# Patient Record
Sex: Male | Born: 1960 | Race: White | Hispanic: No | Marital: Single | Smoking: Former smoker
Health system: Southern US, Community
[De-identification: ages and names within clinical notes are randomized; demographics above are authoritative.]

## PROBLEM LIST (undated history)

## (undated) DIAGNOSIS — I10 Essential (primary) hypertension: Secondary | ICD-10-CM

## (undated) DIAGNOSIS — G2581 Restless legs syndrome: Secondary | ICD-10-CM

## (undated) DIAGNOSIS — Z915 Personal history of self-harm: Secondary | ICD-10-CM

## (undated) DIAGNOSIS — N429 Disorder of prostate, unspecified: Secondary | ICD-10-CM

## (undated) DIAGNOSIS — F32A Depression, unspecified: Secondary | ICD-10-CM

## (undated) DIAGNOSIS — Z5900 Homelessness unspecified: Secondary | ICD-10-CM

## (undated) DIAGNOSIS — F419 Anxiety disorder, unspecified: Secondary | ICD-10-CM

## (undated) DIAGNOSIS — F329 Major depressive disorder, single episode, unspecified: Secondary | ICD-10-CM

## (undated) DIAGNOSIS — F845 Asperger's syndrome: Secondary | ICD-10-CM

## (undated) DIAGNOSIS — Z9151 Personal history of suicidal behavior: Secondary | ICD-10-CM

---

## 2013-12-18 ENCOUNTER — Emergency Department (HOSPITAL_COMMUNITY): Payer: Self-pay

## 2013-12-18 ENCOUNTER — Inpatient Hospital Stay (HOSPITAL_COMMUNITY)
Admission: EM | Admit: 2013-12-18 | Discharge: 2013-12-20 | DRG: 081 | Disposition: A | Discharge status: Home / Self Care | Payer: Self-pay | Attending: Internal Medicine | Admitting: Internal Medicine

## 2013-12-18 ENCOUNTER — Encounter (HOSPITAL_COMMUNITY): Payer: Self-pay | Admitting: Emergency Medicine

## 2013-12-18 DIAGNOSIS — F3289 Other specified depressive episodes: Secondary | ICD-10-CM | POA: Diagnosis present

## 2013-12-18 DIAGNOSIS — I10 Essential (primary) hypertension: Secondary | ICD-10-CM | POA: Diagnosis present

## 2013-12-18 DIAGNOSIS — Z8673 Personal history of transient ischemic attack (TIA), and cerebral infarction without residual deficits: Secondary | ICD-10-CM

## 2013-12-18 DIAGNOSIS — E860 Dehydration: Secondary | ICD-10-CM | POA: Diagnosis present

## 2013-12-18 DIAGNOSIS — G9340 Encephalopathy, unspecified: Secondary | ICD-10-CM | POA: Insufficient documentation

## 2013-12-18 DIAGNOSIS — F1210 Cannabis abuse, uncomplicated: Secondary | ICD-10-CM | POA: Diagnosis present

## 2013-12-18 DIAGNOSIS — R404 Transient alteration of awareness: Principal | ICD-10-CM | POA: Diagnosis present

## 2013-12-18 DIAGNOSIS — R9089 Other abnormal findings on diagnostic imaging of central nervous system: Secondary | ICD-10-CM | POA: Diagnosis present

## 2013-12-18 DIAGNOSIS — N400 Enlarged prostate without lower urinary tract symptoms: Secondary | ICD-10-CM | POA: Diagnosis present

## 2013-12-18 DIAGNOSIS — F848 Other pervasive developmental disorders: Secondary | ICD-10-CM | POA: Diagnosis present

## 2013-12-18 DIAGNOSIS — Z5900 Homelessness unspecified: Secondary | ICD-10-CM

## 2013-12-18 DIAGNOSIS — F411 Generalized anxiety disorder: Secondary | ICD-10-CM | POA: Diagnosis present

## 2013-12-18 DIAGNOSIS — F329 Major depressive disorder, single episode, unspecified: Secondary | ICD-10-CM | POA: Diagnosis present

## 2013-12-18 DIAGNOSIS — R930 Abnormal findings on diagnostic imaging of skull and head, not elsewhere classified: Secondary | ICD-10-CM

## 2013-12-18 DIAGNOSIS — R410 Disorientation, unspecified: Secondary | ICD-10-CM | POA: Diagnosis present

## 2013-12-18 HISTORY — DX: Essential (primary) hypertension: I10

## 2013-12-18 LAB — CBC WITH DIFFERENTIAL/PLATELET
Eosinophils Relative: 0 % (ref 0–5)
HCT: 47.5 % (ref 39.0–52.0)
Hemoglobin: 16.3 g/dL (ref 13.0–17.0)
Lymphocytes Relative: 15 % (ref 12–46)
Lymphs Abs: 2.3 10*3/uL (ref 0.7–4.0)
MCHC: 34.3 g/dL (ref 30.0–36.0)
MCV: 91.7 fL (ref 78.0–100.0)
Monocytes Absolute: 0.9 10*3/uL (ref 0.1–1.0)
Monocytes Relative: 6 % (ref 3–12)
Neutro Abs: 12.3 10*3/uL — ABNORMAL HIGH (ref 1.7–7.7)
Neutrophils Relative %: 79 % — ABNORMAL HIGH (ref 43–77)
RBC: 5.18 MIL/uL (ref 4.22–5.81)
WBC: 15.5 10*3/uL — ABNORMAL HIGH (ref 4.0–10.5)

## 2013-12-18 LAB — URINALYSIS, ROUTINE W REFLEX MICROSCOPIC
Bilirubin Urine: NEGATIVE
Glucose, UA: NEGATIVE mg/dL
Ketones, ur: 40 mg/dL — AB
Leukocytes, UA: NEGATIVE
Nitrite: NEGATIVE
Specific Gravity, Urine: 1.02 (ref 1.005–1.030)
pH: 6.5 (ref 5.0–8.0)

## 2013-12-18 LAB — COMPREHENSIVE METABOLIC PANEL
Albumin: 4.8 g/dL (ref 3.5–5.2)
Alkaline Phosphatase: 113 U/L (ref 39–117)
BUN: 14 mg/dL (ref 6–23)
Calcium: 9.4 mg/dL (ref 8.4–10.5)
Creatinine, Ser: 0.87 mg/dL (ref 0.50–1.35)
GFR calc Af Amer: >90 mL/min (ref >90)
Glucose, Bld: 152 mg/dL — ABNORMAL HIGH (ref 70–99)
Potassium: 4.2 mEq/L (ref 3.7–5.3)
Total Protein: 8.3 g/dL (ref 6.0–8.3)

## 2013-12-18 LAB — CK TOTAL AND CKMB (NOT AT ARMC): Relative Index: 1.6 (ref 0.0–2.5)

## 2013-12-18 LAB — RAPID URINE DRUG SCREEN, HOSP PERFORMED
Amphetamines: NOT DETECTED
Barbiturates: NOT DETECTED
Benzodiazepines: NOT DETECTED
Cocaine: NOT DETECTED
Opiates: NOT DETECTED

## 2013-12-18 LAB — ETHANOL: Alcohol, Ethyl (B): <11 mg/dL (ref 0–11)

## 2013-12-18 LAB — MRSA PCR SCREENING: MRSA by PCR: NEGATIVE

## 2013-12-18 MED ORDER — SODIUM CHLORIDE 0.9 % IV SOLN
INTRAVENOUS | Status: DC
  Administered 2013-12-18: 16:00:00 via INTRAVENOUS

## 2013-12-18 MED ORDER — LORAZEPAM 1 MG PO TABS: 1.0000 mg | ORAL_TABLET | ORAL | Status: DC | PRN

## 2013-12-18 MED ORDER — PROPRANOLOL HCL 20 MG PO TABS
20.0000 mg | ORAL_TABLET | Freq: Two times a day (BID) | ORAL | Status: DC
  Administered 2013-12-18: 20 mg via ORAL
  Filled 2013-12-18 (×3): qty 1

## 2013-12-18 MED ORDER — ACETAMINOPHEN 650 MG RE SUPP: 650.0000 mg | Freq: Four times a day (QID) | RECTAL | Status: DC | PRN

## 2013-12-18 MED ORDER — TAMSULOSIN HCL 0.4 MG PO CAPS
0.4000 mg | ORAL_CAPSULE | Freq: Every day | ORAL | Status: DC
  Administered 2013-12-18 – 2013-12-19 (×2): 0.4 mg via ORAL
  Filled 2013-12-18 (×4): qty 1

## 2013-12-18 MED ORDER — SODIUM CHLORIDE 0.9 % IV SOLN: INTRAVENOUS | Status: DC

## 2013-12-18 MED ORDER — ASPIRIN EC 81 MG PO TBEC
81.0000 mg | DELAYED_RELEASE_TABLET | Freq: Every day | ORAL | Status: DC
  Administered 2013-12-18 – 2013-12-20 (×3): 81 mg via ORAL
  Filled 2013-12-18 (×3): qty 1

## 2013-12-18 MED ORDER — NORTRIPTYLINE HCL 25 MG PO CAPS
50.0000 mg | ORAL_CAPSULE | Freq: Every day | ORAL | Status: DC
  Administered 2013-12-18: 50 mg via ORAL
  Filled 2013-12-18 (×2): qty 2

## 2013-12-18 MED ORDER — ACETAMINOPHEN 325 MG PO TABS: 650.0000 mg | ORAL_TABLET | Freq: Four times a day (QID) | ORAL | Status: DC | PRN

## 2013-12-18 MED ORDER — SODIUM CHLORIDE 0.9 % IV BOLUS (SEPSIS)
1000.0000 mL | Freq: Once | INTRAVENOUS | Status: AC
  Administered 2013-12-18: 1000 mL via INTRAVENOUS

## 2013-12-18 MED ORDER — ENOXAPARIN SODIUM 40 MG/0.4ML ~~LOC~~ SOLN
40.0000 mg | SUBCUTANEOUS | Status: DC
  Administered 2013-12-18 – 2013-12-19 (×2): 40 mg via SUBCUTANEOUS
  Filled 2013-12-18 (×3): qty 0.4

## 2013-12-18 MED ORDER — LORAZEPAM 2 MG/ML IJ SOLN
2.0000 mg | Freq: Once | INTRAMUSCULAR | Status: AC
  Administered 2013-12-18: 2 mg via INTRAVENOUS
  Filled 2013-12-18: qty 1

## 2013-12-18 MED ORDER — LISINOPRIL 20 MG PO TABS
20.0000 mg | ORAL_TABLET | Freq: Every day | ORAL | Status: DC
  Administered 2013-12-18: 20 mg via ORAL
  Filled 2013-12-18 (×2): qty 1

## 2013-12-18 MED ORDER — CLONIDINE HCL 0.2 MG PO TABS
0.2000 mg | ORAL_TABLET | Freq: Four times a day (QID) | ORAL | Status: DC | PRN
  Filled 2013-12-18: qty 1

## 2013-12-18 NOTE — ED Notes (Signed)
Patient transported to CT 

## 2013-12-18 NOTE — ED Notes (Signed)
Per nurse first, a lady came and said she was bringing the pt here because he was crazy and she left him sitting on the bench outside.  Pt is making is speaking in a word salad.  He did say the word Asbergers, fast heart, anxious.  Denies any pain - hr 145.

## 2013-12-18 NOTE — ED Provider Notes (Signed)
CSN: [id number]     Arrival date & time 12/18/13  1237 History   First MD Initiated Contact with Patient 12/18/13 1248     Chief Complaint  Patient presents with  . Tachycardia  . asbergers    patient agitated level V caveat altered mental status (Consider location/radiation/quality/duration/timing/severity/associated sxs/prior Treatment) HPI Patient respiratory daily Past Medical History  Diagnosis Date  . Autism    patient reportedly dropped off by a bystander who did not stay to give further history. Patient speaking in word salad. Denies pain anywhere. History reviewed. No pertinent past surgical history. No family history on file. History  Substance Use Topics  . Smoking status: Never Smoker   . Smokeless tobacco: Not on file  . Alcohol Use: No   unable to obtain social history. Patient does not answer questions appropriately.  Review of Systems  Unable to perform ROS: Mental status change    Allergies  Review of patient's allergies indicates no known allergies.  Home Medications  No current outpatient prescriptions on file. BP 177/124  Pulse 145  Temp(Src) 98.8 F (37.1 C) (Oral)  Resp 24  Ht 5\' 10"  (1.778 m)  Wt 164 lb 11.2 oz (74.707 kg)  BMI 23.63 kg/m2  SpO2 98% Physical Exam  Nursing note and vitals reviewed. Constitutional: He appears well-developed and well-nourished.  HENT:  Head: Normocephalic and atraumatic.  Eyes: Conjunctivae are normal. Pupils are equal, round, and reactive to light.  Neck: Neck supple. No tracheal deviation present. No thyromegaly present.  Cardiovascular: Normal rate.   No murmur heard. Tachycardic  Pulmonary/Chest: Effort normal and breath sounds normal.  Abdominal: Soft. Bowel sounds are normal. He exhibits no distension. There is no tenderness.  Musculoskeletal: Normal range of motion. He exhibits no edema and no tenderness.  Neurological: He is alert. Coordination normal.  Pain is 2 through 12 grossly intact moves all  extremities well motor strength 5 over 5 overall.  Skin: Skin is warm and dry. No rash noted.  Psychiatric:  Anxious    ED Course  Procedures (including critical care time) Labs Review Labs Reviewed  COMPREHENSIVE METABOLIC PANEL  CBC WITH DIFFERENTIAL  ETHANOL  URINE RAPID DRUG SCREEN (HOSP PERFORMED)  TSH  CK TOTAL AND CKMB   Imaging Review No results found.  EKG Interpretation    Date/Time:  Wednesday December 18 2013 12:40:14 EST Ventricular Rate:  142 PR Interval:  150 QRS Duration: 92 QT Interval:  268 QTC Calculation: 412 R Axis:   48 Text Interpretation:  Sinus tachycardia Otherwise normal ECG No old tracing to compare Confirmed by Ethelda Chick  MD, Thi Klich ([id number]) on 12/18/2013 1:03:17 PM           Results for orders placed during the hospital encounter of 12/18/13  COMPREHENSIVE METABOLIC PANEL      Result Value Range   Sodium 143  137 - 147 mEq/L   Potassium 4.2  3.7 - 5.3 mEq/L   Chloride 102  96 - 112 mEq/L   CO2 23  19 - 32 mEq/L   Glucose, Bld 152 (*) 70 - 99 mg/dL   BUN 14  6 - 23 mg/dL   Creatinine, Ser 4.09  0.50 - 1.35 mg/dL   Calcium 9.4  8.4 - 81.1 mg/dL   Total Protein 8.3  6.0 - 8.3 g/dL   Albumin 4.8  3.5 - 5.2 g/dL   AST 21  0 - 37 U/L   ALT 18  0 - 53 U/L   Alkaline Phosphatase 113  39 - 117 U/L   Total Bilirubin 0.4  0.3 - 1.2 mg/dL   GFR calc non Af Amer >90  >90 mL/min   GFR calc Af Amer >90  >90 mL/min  CBC WITH DIFFERENTIAL      Result Value Range   WBC 15.5 (*) 4.0 - 10.5 K/uL   RBC 5.18  4.22 - 5.81 MIL/uL   Hemoglobin 16.3  13.0 - 17.0 g/dL   HCT 47.8  29.5 - 62.1 %   MCV 91.7  78.0 - 100.0 fL   MCH 31.5  26.0 - 34.0 pg   MCHC 34.3  30.0 - 36.0 g/dL   RDW 30.8  65.7 - 84.6 %   Platelets 295  150 - 400 K/uL   Neutrophils Relative % 79 (*) 43 - 77 %   Neutro Abs 12.3 (*) 1.7 - 7.7 K/uL   Lymphocytes Relative 15  12 - 46 %   Lymphs Abs 2.3  0.7 - 4.0 K/uL   Monocytes Relative 6  3 - 12 %   Monocytes Absolute 0.9  0.1 -  1.0 K/uL   Eosinophils Relative 0  0 - 5 %   Eosinophils Absolute 0.0  0.0 - 0.7 K/uL   Basophils Relative 0  0 - 1 %   Basophils Absolute 0.0  0.0 - 0.1 K/uL  ETHANOL      Result Value Range   Alcohol, Ethyl (B) <11  0 - 11 mg/dL  CK TOTAL AND CKMB      Result Value Range   Total CK 240 (*) 7 - 232 U/L   CK, MB 3.9  0.3 - 4.0 ng/mL   Relative Index 1.6  0.0 - 2.5   Ct Head Wo Contrast  12/18/2013   CLINICAL DATA:  Altered mental status.  EXAM: CT HEAD WITHOUT CONTRAST  TECHNIQUE: Contiguous axial images were obtained from the base of the skull through the vertex without intravenous contrast.  COMPARISON:  None.  FINDINGS: 10 mm hypodensity extending from the head of the left caudate nucleus into the anterior limb of the left internal capsule and globus pallidus nucleus, potentially chronic or subacute. Otherwise, the brainstem, cerebellum, cerebral peduncles, thalamus, basal ganglia, basilar cisterns, and ventricular system appear within normal limits. No intracranial mass lesion or intracranial hemorrhage.  IMPRESSION: 1. 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 Otherwise negative exam.   Electronically Signed   By: Herbie Baltimore M.D.   On: 12/18/2013 14:20    3:10 PM patient somnolent arousable to gentle tactile stimulus after treatment with intravenous Ativan. Continues to speak in word salad. MDM  No diagnosis found. There is no one available to give further history. Old records unavailable. Patient is not have a home telephone number or address listed. Chronicity of illness is unknown. Patient is felt to be hypermetabolic or hyperadrenergic Plan admit step down bed Case discussed with Dr. Lendell Caprice Admit step down bed Diagnosis #1acute encephalopathy #2hyperglycemia  CRITICAL CARE Performed by: Doug Sou Total critical care time: 40 minute Critical care time was  exclusive of separately billable procedures and treating other patients. Critical care was necessary to treat or prevent imminent or life-threatening deterioration. Critical care was time spent personally by me on the following activities: development of treatment plan with patient and/or surrogate as well as nursing, discussions with consultants, evaluation of patient's response to treatment, examination of patient, obtaining history from patient or  surrogate, ordering and performing treatments and interventions, ordering and review of laboratory studies, ordering and review of radiographic studies, pulse oximetry and re-evaluation of patient's condition.  Doug Sou, MD 12/18/13 [phone]

## 2013-12-18 NOTE — H&P (Signed)
[facility] History and Physical  Zachary Russo WUJ:[id number] DOB: 01/25/1961 DOA: 12/18/2013  Referring physician: Rennis Russo PCP: Zachary Russo public health   Chief Complaint: dropped of in ED by unknown woman: "he's crazy"  HPI: Zachary Russo is a 52 y.o. male  Brought to ED by a woman who said "he's crazy" and left.  Initially was "speaking word salad".  Patient was tachycardic into the 150s, blood pressure.  180/120.  Heart rate and blood pressure decreased after receiving ativan, but now high again.  CT brain shows old or subacute infarct.  Labs ok, including blood alcohol. UDS ordered, but not yet collected.  Patient received 2 mg IV ativan in ED.  Now able to give some details of medical history, but still somewhat disoriented and unable to say why he was brought to the ED.  Denies alcohol.  Smokes marijuana occasionally.  Reports he has a psychiatric history and was admitted to hospital in [place] "to get meds adusted".  Asking if he needs to be commited to psych hospital.  Admits to h/o Asberger's syndrome, HTN, anxiety.  Lives in a homeless shelter and takes antihypertensives "as needed". Weaned off klonipin a few months ago.   Review of Systems:  Systems reviewed. As above, otherwise negative  PMH: per report: Asberger's Syndrome, HTN, "Psychiatric history", prostatism  Sugical hx: none  Social History: homeless. Recently moved back to [place]. Lives in homeless shelter. Denies EtOH. Smokes Marijuana  No Known Allergies  FH: adopted  Meds: Per report: propranolol 10 mg "as needed", lisinopril 20 mg, gabapentin 300 mg hs "sometimes", nortriptyline, previously, klonipin, prostate medication  Physical Exam: Filed Vitals:   12/18/13 2000  BP: 185/114  Pulse: 125  Temp: 99.4 F (37.4 C)  Resp: 21    BP 185/114  Pulse 125  Temp(Src) 99.4 F (37.4 C) (Oral)  Resp 21  Ht 5\' 10"  (1.778 m)  Wt 74.3 kg (163 lb 12.8 oz)  BMI 23.50 kg/m2  SpO2 99%  BP 185/114   Pulse 125  Temp(Src) 99.4 F (37.4 C) (Oral)  Resp 21  Ht 5\' 10"  (1.778 m)  Wt 74.3 kg (163 lb 12.8 oz)  BMI 23.50 kg/m2  SpO2 99%  General Appearance:    Alert, cooperative, no distress, sometimes meandering and inappropriate, but answers some questions appropriately. Disoriented to date. Knows year and month.  Head:    Normocephalic, without obvious abnormality, atraumatic  Eyes:    PERRL, conjunctiva/corneas clear, EOM's intact, fundi    benign, both eyes          Nose:   Nares normal, septum midline, mucosa normal, no drainage   or sinus tenderness  Throat:   Slightly dry mucous membranes  Neck:   Supple, symmetrical, trachea midline, no adenopathy;       thyroid:  No enlargement/tenderness/nodules; no carotid   bruit or JVD  Back:     Symmetric, no curvature, ROM normal, no CVA tenderness  Lungs:     Clear to auscultation bilaterally, respirations unlabored  Chest wall:    No tenderness or deformity  Heart:    Fast, regular, no MGR  Abdomen:     Soft, non-tender, bowel sounds active all four quadrants,    no masses, no organomegaly  Genitalia:    deferred  Rectal:    deferred  Extremities:   Extremities normal, atraumatic, no cyanosis or edema  Pulses:   2+ and symmetric all extremities  Skin:   Skin color, texture, turgor normal, no rashes or lesions  Lymph nodes:   Cervical, supraclavicular, and axillary nodes normal  Neurologic:   CNII-XII intact. Normal strength, sensation and reflexes      throughout             Psych: cooperative. Affect appropriate.  Labs on Admission:  Basic Metabolic Panel:  Recent Labs Lab 12/18/13 1315  NA 143  K 4.2  CL 102  CO2 23  GLUCOSE 152*  BUN 14  CREATININE 0.87  CALCIUM 9.4   Liver Function Tests:  Recent Labs Lab 12/18/13 1315  AST 21  ALT 18  ALKPHOS 113  BILITOT 0.4  PROT 8.3  ALBUMIN 4.8   No results found for this basename: LIPASE, AMYLASE,  in the last 168 hours No results found for this basename:  AMMONIA,  in the last 168 hours CBC:  Recent Labs Lab 12/18/13 1315  WBC 15.5*  NEUTROABS 12.3*  HGB 16.3  HCT 47.5  MCV 91.7  PLT 295   Cardiac Enzymes:  Recent Labs Lab 12/18/13 1315  CKTOTAL 240*  CKMB 3.9    BNP (last 3 results) No results found for this basename: PROBNP,  in the last 8760 hours CBG: No results found for this basename: GLUCAP,  in the last 168 hours  Radiological Exams on Admission: Ct Head Wo Contrast  12/18/2013   CLINICAL DATA:  Altered mental status.  EXAM: CT HEAD WITHOUT CONTRAST  TECHNIQUE: Contiguous axial images were obtained from the base of the skull through the vertex without intravenous contrast.  COMPARISON:  None.  FINDINGS: 10 mm hypodensity extending from the head of the left caudate nucleus into the anterior limb of the left internal capsule and globus pallidus nucleus, potentially chronic or subacute. Otherwise, the brainstem, cerebellum, cerebral peduncles, thalamus, basal ganglia, basilar cisterns, and ventricular system appear within normal limits. No intracranial mass lesion or intracranial hemorrhage.  IMPRESSION: 1. 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 Otherwise negative exam.   Electronically Signed   By: Herbie Baltimore M.D.   On: 12/18/2013 14:20    EKG: Sinus tachycardia Otherwise normal ECG No old tracing to compare  Assessment/Plan    Delirium (v. Psychosis): improved. UDS pending.  CT brain shows old or subacute CVA.  Will check MRI.  Still somewhat inapropriate.  Baseline unknown.  Incomplete database.  Will request records from hospital in [place].  May need psychiatric consult. Ativan helped. Will order PRN. Could be withdrawal from klonipin, but pt denies taking any for months  Mild dehydration: IVF    Malignant hypertension: resume lisinopril and propranolol.     Abnormal brain CT  Reported h/o  Asbergers and unknown psychiatric illness, BPH  Start flomax  Code Status: full Family Communication: no family? Disposition Plan: ?  Time spent: 60 min  Zachary Russo [facility] Pager [phone]

## 2013-12-18 NOTE — ED Notes (Signed)
Patient still unable to get an urine sample will try again later

## 2013-12-19 ENCOUNTER — Inpatient Hospital Stay (HOSPITAL_COMMUNITY): Payer: Self-pay

## 2013-12-19 DIAGNOSIS — G9340 Encephalopathy, unspecified: Secondary | ICD-10-CM

## 2013-12-19 DIAGNOSIS — R4182 Altered mental status, unspecified: Secondary | ICD-10-CM

## 2013-12-19 DIAGNOSIS — F848 Other pervasive developmental disorders: Secondary | ICD-10-CM

## 2013-12-19 DIAGNOSIS — F1910 Other psychoactive substance abuse, uncomplicated: Secondary | ICD-10-CM

## 2013-12-19 LAB — CBC WITH DIFFERENTIAL/PLATELET
Basophils Absolute: 0 10*3/uL (ref 0.0–0.1)
Basophils Relative: 0 % (ref 0–1)
Eosinophils Absolute: 0.1 10*3/uL (ref 0.0–0.7)
Eosinophils Relative: 1 % (ref 0–5)
HCT: 40 % (ref 39.0–52.0)
Hemoglobin: 13.3 g/dL (ref 13.0–17.0)
Lymphocytes Relative: 20 % (ref 12–46)
Lymphs Abs: 2.1 10*3/uL (ref 0.7–4.0)
MCH: 31 pg (ref 26.0–34.0)
MCHC: 33.3 g/dL (ref 30.0–36.0)
MCV: 93.2 fL (ref 78.0–100.0)
Monocytes Absolute: 0.5 10*3/uL (ref 0.1–1.0)
Monocytes Relative: 5 % (ref 3–12)
Neutro Abs: 7.8 10*3/uL — ABNORMAL HIGH (ref 1.7–7.7)
Neutrophils Relative %: 74 % (ref 43–77)
Platelets: 232 10*3/uL (ref 150–400)
RBC: 4.29 MIL/uL (ref 4.22–5.81)
RDW: 13.9 % (ref 11.5–15.5)
WBC: 10.5 10*3/uL (ref 4.0–10.5)

## 2013-12-19 LAB — TSH: TSH: 1.425 u[IU]/mL (ref 0.350–4.500)

## 2013-12-19 MED ORDER — GABAPENTIN 100 MG PO CAPS
100.0000 mg | ORAL_CAPSULE | Freq: Two times a day (BID) | ORAL | Status: DC
  Filled 2013-12-19 (×4): qty 1

## 2013-12-19 MED ORDER — PROPRANOLOL HCL 20 MG PO TABS
20.0000 mg | ORAL_TABLET | Freq: Two times a day (BID) | ORAL | Status: DC
  Administered 2013-12-19 (×2): 20 mg via ORAL
  Filled 2013-12-19 (×4): qty 1

## 2013-12-19 MED ORDER — LORAZEPAM 2 MG/ML IJ SOLN
1.0000 mg | Freq: Once | INTRAMUSCULAR | Status: AC
  Administered 2013-12-19: 1 mg via INTRAVENOUS
  Filled 2013-12-19: qty 1

## 2013-12-19 MED ORDER — CLONIDINE HCL 0.2 MG PO TABS
0.2000 mg | ORAL_TABLET | Freq: Two times a day (BID) | ORAL | Status: DC
  Administered 2013-12-19 (×2): 0.2 mg via ORAL
  Filled 2013-12-19 (×4): qty 1

## 2013-12-19 MED ORDER — NORTRIPTYLINE HCL 25 MG PO CAPS
100.0000 mg | ORAL_CAPSULE | Freq: Every day | ORAL | Status: DC
  Administered 2013-12-19: 22:00:00 100 mg via ORAL
  Filled 2013-12-19 (×2): qty 4

## 2013-12-19 MED ORDER — LISINOPRIL 20 MG PO TABS
20.0000 mg | ORAL_TABLET | Freq: Every day | ORAL | Status: DC
  Administered 2013-12-19 – 2013-12-20 (×2): 20 mg via ORAL
  Filled 2013-12-19 (×2): qty 1

## 2013-12-19 NOTE — Progress Notes (Signed)
Zachary Russo [id number] Admission Data: 12/19/2013 6:21 PM Attending Provider: Christiane Haorinna L Sullivan, MD  PCP:No PCP Per Patient Consults/ Treatment Team:    Zachary Russo is a 53 y.o. male patient admitted from ED awake, alert  & orientated  X 3,  Full Code, VSS - Blood pressure 127/90, pulse 98, temperature 97.9 F (36.6 C), temperature source Oral, resp. rate 18, height 5\' 10"  (1.778 m), weight 76.3 kg (168 lb 3.4 oz), SpO2 100.00%.,no c/o shortness of breath, no c/o chest pain, no distress noted.    Allergies:  No Known Allergies   Past Medical History  Diagnosis Date  . Hypertension   .Pt verbalizes an understanding of how to use the call bell and to call for help before getting out of bed.  Skin, clean-dry- intact without evidence of bruising, or skin tears.   No evidence of skin break down noted on exam.    Will cont to monitor and assist as needed.  Kern ReapBrumagin, Isaah Furry L, RN 12/19/2013 6:21 PM

## 2013-12-19 NOTE — Progress Notes (Addendum)
[facility] PROGRESS NOTE  Zachary ButterKevin Biswas UEA:[id number] DOB: 1961/03/14 DOA: 12/18/2013 PCP: No PCP Per Patient  Assessment/Plan:  Active Problems:   Delirium vs psychosis: improved. Suspect close to baseline. No records from OSH yet. Per friend, Hortencia ConradiMara Barker [phone], has had episodes of this in the past. She reports he has a history of mental illness and has been hospitalized in a psych facility for similar presentation. He arrived at her house from a homeless shelter yesterday "speaking gibberish". She reports he has a history of depression. Unable to give any more history.  Urine drug screen positive for THC, to which patient had already admitted. Seems a bit anxious currently. Will give another dose of Ativan. Saline Lock IV. Consult psychiatry for medication management.    Malignant hypertension: Medications resumed. Blood pressure tends to rise with level of agitation. See above. Will also give standing dose of clonidine as this will help blood pressure and anxiety.    Abnormal brain CT: MRI brain pending  Reported history of Aspergers syndrome, BPH  After I made rounds, the nurse reports that patient is upset about not diet and is threatening to leave AGAINST MEDICAL ADVICE. He is currently lucid and may be close to his clinical baseline. He has capacity to leave AMA if he so chooses, but I encouraged him to stay. Transfer to MedSurg if he does stay.  Code Status:  full Family Communication:   Disposition Plan:  home  HPI/Subjective: Feels anxious about not having all the information. Reports he has "OCD" and is anxious about his blood pressure. Wondering when he'll get his blood pressure. Asking if he can come off salt restricted diet.  Objective: Filed Vitals:   12/19/13 0856  BP: 128/110  Pulse: 109  Temp: 97.3 F (36.3 C)  Resp:     Intake/Output Summary (Last 24 hours) at 12/19/13 0909 Last data filed at 12/19/13 0800  Gross per 24 hour  Intake   1625 ml   Output   1350 ml  Net    275 ml   Filed Weights   12/18/13 1244 12/18/13 1747  Weight: 74.707 kg (164 lb 11.2 oz) 74.3 kg (163 lb 12.8 oz)    Exam:   General:  Anxious appearing. Cooperative. Oriented to time place person. Remembers me from yesterday  Cardiovascular: Tachycardic, regular  Respiratory: Clear to auscultation bilaterally without wheeze rhonchi or rales  Abdomen: Soft nontender nondistended  Ext: No clubbing cyanosis or edema  Psychiatric: Appears anxious but cooperative. For the most part appropriate, but does sometimes meander when asked direct yes or no questions.  Neurologic: Cranial nerves sensory motor exam are intact. No tremulousness.  Basic Metabolic Panel:  Recent Labs Lab 12/18/13 1315  NA 143  K 4.2  CL 102  CO2 23  GLUCOSE 152*  BUN 14  CREATININE 0.87  CALCIUM 9.4   Liver Function Tests:  Recent Labs Lab 12/18/13 1315  AST 21  ALT 18  ALKPHOS 113  BILITOT 0.4  PROT 8.3  ALBUMIN 4.8   No results found for this basename: LIPASE, AMYLASE,  in the last 168 hours No results found for this basename: AMMONIA,  in the last 168 hours CBC:  Recent Labs Lab 12/18/13 1315 12/19/13 0400  WBC 15.5* 10.5  NEUTROABS 12.3* 7.8*  HGB 16.3 13.3  HCT 47.5 40.0  MCV 91.7 93.2  PLT 295 232   Cardiac Enzymes:  Recent Labs Lab 12/18/13 1315  CKTOTAL 240*  CKMB 3.9   BNP (last 3  results) No results found for this basename: PROBNP,  in the last 8760 hours CBG: No results found for this basename: GLUCAP,  in the last 168 hours  Recent Results (from the past 240 hour(s))  MRSA PCR SCREENING     Status: None   Collection Time    12/18/13  7:04 PM      Result Value Range Status   MRSA by PCR NEGATIVE  NEGATIVE Final   Comment:            The GeneXpert MRSA Assay (FDA     approved for NASAL specimens     only), is one component of a     comprehensive MRSA colonization     surveillance program. It is not     intended to diagnose  MRSA     infection nor to guide or     monitor treatment for     MRSA infections.     Studies: Ct Head Wo Contrast  12/18/2013   CLINICAL DATA:  Altered mental status.  EXAM: CT HEAD WITHOUT CONTRAST  TECHNIQUE: Contiguous axial images were obtained from the base of the skull through the vertex without intravenous contrast.  COMPARISON:  None.  FINDINGS: 10 mm hypodensity extending from the head of the left caudate nucleus into the anterior limb of the left internal capsule and globus pallidus nucleus, potentially chronic or subacute. Otherwise, the brainstem, cerebellum, cerebral peduncles, thalamus, basal ganglia, basilar cisterns, and ventricular system appear within normal limits. No intracranial mass lesion or intracranial hemorrhage.  IMPRESSION: 1. 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 Otherwise negative exam.   Electronically Signed   By: Herbie Baltimore M.D.   On: 12/18/2013 14:20    Scheduled Meds: . aspirin EC  81 mg Oral Daily  . enoxaparin (LOVENOX) injection  40 mg Subcutaneous Q24H  . lisinopril  20 mg Oral Daily  . LORazepam  1 mg Intravenous Once  . nortriptyline  50 mg Oral QHS  . propranolol  20 mg Oral BID  . tamsulosin  0.4 mg Oral QPC supper   Continuous Infusions:   Time spent: 35 minutes  Tiphanie Vo L  [facility] Pager [phone]. If 7PM-7AM, please contact night-coverage at [email or web address], password [facility] 12/19/2013, 9:09 AM  LOS: 1 day

## 2013-12-19 NOTE — Progress Notes (Addendum)
On call csw received call from RN regarding outpatient psych resources for patient. Per RN, patient recommended by psychiatrist [facility]. CSW emailed rn resources, including information for [facility] and mobile crisis. RN aware that [facility] does not take appointments and that [facility].   Catha GosselinKristen Stewart, LCSW on call csw pager # [phone] .12/19/2013 1520pm   Per attending, patient is not medically stable at this time. Per attending patient may be ready for discharge tomorrow. Unit csw to follow up at that time with homeless resources tomorrow, including information for [facility].   Catha GosselinKristen Stewart, LCSW on call csw pager # [phone] .12/19/2013 1533pm

## 2013-12-19 NOTE — Progress Notes (Signed)
D/c telemetry per MD at this time

## 2013-12-19 NOTE — Consult Note (Signed)
[facility] Face-to-Face Psychiatry Consult   Reason for Consult:  Change in her mental status Referring Physician:  Dr Drema Balzarine is an 53 y.o. male.  Assessment: AXIS I:  Substance Abuse and delerium AXIS II:  Deferred AXIS III:   Past Medical History  Diagnosis Date  . Hypertension    AXIS IV:  other psychosocial or environmental problems and problems related to social environment AXIS V:  51-60 moderate symptoms  Plan:  No evidence of imminent risk to self or others at present.   Patient does not meet criteria for psychiatric inpatient admission. Supportive therapy provided about ongoing stressors. Discussed crisis plan, support from social network, calling 911, coming to the Emergency Department, and calling Suicide Hotline.  Subjective:   Zachary Russo is a 53 y.o. male patient admitted with change in her mental status.  HPI:  Patient seen chart reviewed.  The patient is a 53 year old male Caucasian currently unemployed man who was admitted on the medical floor because of change in her mental status.  As per chart he was incoherent and confused.  Patient has Asperger syndrome, hypertension and depressive disorder.  His mental status is much improved from the past.  Patient admitted history of anxiety and depression and has taken Klonopin, Cogentin and nortriptyline in the past.  Patient endorsed that he has been taking himself Klonopin off because he has difficulty getting her benzodiazepine.  Patient follows at [facility] in [place].  He's been living in a shelter for past 2 weeks since his houses for closure.  Patient admitted that living situation is very stressful.  He does not like other people's hygiene.  Patient admitted history of one psychiatric hospitalization in [place].  He has history of overdose on Ambien and believe Ambien he came more confused and he was hallucinating.  Patient endorses family members are dead.  He has 4 children.  He has a very close friend who  provided some information.  Patient is positive marijuana.  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  Patient does not exhibit any paranoia, psychosis at this time.  He denies any suicidal thoughts or any homicidal thoughts.  He is not happy because he's not getting regular food.  However he is cooperative and he is not aggressive or combative.  He requested to be discharged like to followup outpatient. HPI Elements:   Location:  Medical floor. Quality:  fair. Severity:  mild.  Past Psychiatric History: Past Medical History  Diagnosis Date  . Hypertension     reports that he has never smoked. He does not have any smokeless tobacco history on file. He reports that he does not drink alcohol or use illicit drugs. No family history on file.       Abuse/Neglect [facility]) Physical Abuse: Denies Verbal Abuse: Denies Sexual Abuse: Denies Allergies:  No Known Allergies  ACT Assessment Complete:  Yes:    Educational Status    Risk to Self: Risk to self Is patient at risk for suicide?: No Substance abuse history and/or treatment for substance abuse?: No  Risk to Others:    Abuse: Abuse/Neglect Assessment (Assessment to be complete while patient is alone) Physical Abuse: Denies Verbal Abuse: Denies Sexual Abuse: Denies Exploitation of patient/patient's resources: Denies Self-Neglect: Denies  Prior Inpatient Therapy:    Prior Outpatient Therapy:    Additional Information:                    Objective: Blood pressure 161/110, pulse  109, temperature 98 F (36.7 C), temperature source Oral, resp. rate 18, height 5' 10"  (1.778 m), weight 163 lb 12.8 oz (74.3 kg), SpO2 99.00%.Body mass index is 23.5 kg/(m^2). Results for orders placed during the hospital encounter of 12/18/13 (from the past 72 hour(s))  COMPREHENSIVE METABOLIC PANEL     Status: Abnormal   Collection Time    12/18/13  1:15 PM      Result Value Range   Sodium 143  137 - 147 mEq/L   Comment: Please note  change in reference range.   Potassium 4.2  3.7 - 5.3 mEq/L   Comment: Please note change in reference range.   Chloride 102  96 - 112 mEq/L   CO2 23  19 - 32 mEq/L   Glucose, Bld 152 (*) 70 - 99 mg/dL   BUN 14  6 - 23 mg/dL   Creatinine, Ser 0.87  0.50 - 1.35 mg/dL   Calcium 9.4  8.4 - 10.5 mg/dL   Total Protein 8.3  6.0 - 8.3 g/dL   Albumin 4.8  3.5 - 5.2 g/dL   AST 21  0 - 37 U/L   Comment: HEMOLYSIS AT THIS LEVEL MAY AFFECT RESULT   ALT 18  0 - 53 U/L   Alkaline Phosphatase 113  39 - 117 U/L   Total Bilirubin 0.4  0.3 - 1.2 mg/dL   GFR calc non Af Amer >90  >90 mL/min   GFR calc Af Amer >90  >90 mL/min   Comment: (NOTE)     The eGFR has been calculated using the CKD EPI equation.     This calculation has not been validated in all clinical situations.     eGFR's persistently <90 mL/min signify possible Chronic Kidney     Disease.  CBC WITH DIFFERENTIAL     Status: Abnormal   Collection Time    12/18/13  1:15 PM      Result Value Range   WBC 15.5 (*) 4.0 - 10.5 K/uL   RBC 5.18  4.22 - 5.81 MIL/uL   Hemoglobin 16.3  13.0 - 17.0 g/dL   HCT 47.5  39.0 - 52.0 %   MCV 91.7  78.0 - 100.0 fL   MCH 31.5  26.0 - 34.0 pg   MCHC 34.3  30.0 - 36.0 g/dL   RDW 13.6  11.5 - 15.5 %   Platelets 295  150 - 400 K/uL   Neutrophils Relative % 79 (*) 43 - 77 %   Neutro Abs 12.3 (*) 1.7 - 7.7 K/uL   Lymphocytes Relative 15  12 - 46 %   Lymphs Abs 2.3  0.7 - 4.0 K/uL   Monocytes Relative 6  3 - 12 %   Monocytes Absolute 0.9  0.1 - 1.0 K/uL   Eosinophils Relative 0  0 - 5 %   Eosinophils Absolute 0.0  0.0 - 0.7 K/uL   Basophils Relative 0  0 - 1 %   Basophils Absolute 0.0  0.0 - 0.1 K/uL  ETHANOL     Status: None   Collection Time    12/18/13  1:15 PM      Result Value Range   Alcohol, Ethyl (B) <11  0 - 11 mg/dL   Comment:            LOWEST DETECTABLE LIMIT FOR     SERUM ALCOHOL IS 11 mg/dL     FOR MEDICAL PURPOSES ONLY  CK TOTAL AND CKMB     Status: Abnormal  Collection Time     12/18/13  1:15 PM      Result Value Range   Total CK 240 (*) 7 - 232 U/L   CK, MB 3.9  0.3 - 4.0 ng/mL   Relative Index 1.6  0.0 - 2.5  MRSA PCR SCREENING     Status: None   Collection Time    12/18/13  7:04 PM      Result Value Range   MRSA by PCR NEGATIVE  NEGATIVE   Comment:            The GeneXpert MRSA Assay (FDA     approved for NASAL specimens     only), is one component of a     comprehensive MRSA colonization     surveillance program. It is not     intended to diagnose MRSA     infection nor to guide or     monitor treatment for     MRSA infections.  TSH     Status: None   Collection Time    12/18/13  8:05 PM      Result Value Range   TSH 1.425  0.350 - 4.500 uIU/mL   Comment: Performed at [facility]     Status: Abnormal   Collection Time    12/18/13  9:50 PM      Result Value Range   Opiates NONE DETECTED  NONE DETECTED   Cocaine NONE DETECTED  NONE DETECTED   Benzodiazepines NONE DETECTED  NONE DETECTED   Amphetamines NONE DETECTED  NONE DETECTED   Tetrahydrocannabinol POSITIVE (*) NONE DETECTED   Barbiturates NONE DETECTED  NONE DETECTED   Comment:            DRUG SCREEN FOR MEDICAL PURPOSES     ONLY.  IF CONFIRMATION IS NEEDED     FOR ANY PURPOSE, NOTIFY LAB     WITHIN 5 DAYS.                LOWEST DETECTABLE LIMITS     FOR URINE DRUG SCREEN     Drug Class       Cutoff (ng/mL)     Amphetamine      1000     Barbiturate      200     Benzodiazepine   294     Tricyclics       765     Opiates          300     Cocaine          300     THC              50  URINALYSIS, ROUTINE W REFLEX MICROSCOPIC     Status: Abnormal   Collection Time    12/18/13  9:50 PM      Result Value Range   Color, Urine YELLOW  YELLOW   APPearance HAZY (*) CLEAR   Specific Gravity, Urine 1.020  1.005 - 1.030   pH 6.5  5.0 - 8.0   Glucose, UA NEGATIVE  NEGATIVE mg/dL   Hgb urine dipstick NEGATIVE  NEGATIVE   Bilirubin Urine  NEGATIVE  NEGATIVE   Ketones, ur 40 (*) NEGATIVE mg/dL   Protein, ur NEGATIVE  NEGATIVE mg/dL   Urobilinogen, UA 1.0  0.0 - 1.0 mg/dL   Nitrite NEGATIVE  NEGATIVE   Leukocytes, UA NEGATIVE  NEGATIVE   Comment: MICROSCOPIC NOT DONE ON URINES WITH  NEGATIVE PROTEIN, BLOOD, LEUKOCYTES, NITRITE, OR GLUCOSE <1000 mg/dL.  CBC WITH DIFFERENTIAL     Status: Abnormal   Collection Time    12/19/13  4:00 AM      Result Value Range   WBC 10.5  4.0 - 10.5 K/uL   RBC 4.29  4.22 - 5.81 MIL/uL   Hemoglobin 13.3  13.0 - 17.0 g/dL   Comment: DELTA CHECK NOTED     REPEATED TO VERIFY   HCT 40.0  39.0 - 52.0 %   MCV 93.2  78.0 - 100.0 fL   MCH 31.0  26.0 - 34.0 pg   MCHC 33.3  30.0 - 36.0 g/dL   RDW 13.9  11.5 - 15.5 %   Platelets 232  150 - 400 K/uL   Neutrophils Relative % 74  43 - 77 %   Neutro Abs 7.8 (*) 1.7 - 7.7 K/uL   Lymphocytes Relative 20  12 - 46 %   Lymphs Abs 2.1  0.7 - 4.0 K/uL   Monocytes Relative 5  3 - 12 %   Monocytes Absolute 0.5  0.1 - 1.0 K/uL   Eosinophils Relative 1  0 - 5 %   Eosinophils Absolute 0.1  0.0 - 0.7 K/uL   Basophils Relative 0  0 - 1 %   Basophils Absolute 0.0  0.0 - 0.1 K/uL   Labs are reviewed.  Current Facility-Administered Medications  Medication Dose Route Frequency Provider Last Rate Last Dose  . acetaminophen (TYLENOL) tablet 650 mg  650 mg Oral Q6H PRN Delfina Redwood, MD       Or  . acetaminophen (TYLENOL) suppository 650 mg  650 mg Rectal Q6H PRN Delfina Redwood, MD      . aspirin EC tablet 81 mg  81 mg Oral Daily Delfina Redwood, MD   81 mg at 12/19/13 0859  . cloNIDine (CATAPRES) tablet 0.2 mg  0.2 mg Oral BID Delfina Redwood, MD   0.2 mg at 12/19/13 1114  . enoxaparin (LOVENOX) injection 40 mg  40 mg Subcutaneous Q24H Delfina Redwood, MD   40 mg at 12/18/13 2044  . lisinopril (PRINIVIL,ZESTRIL) tablet 20 mg  20 mg Oral Daily Delfina Redwood, MD   20 mg at 12/19/13 0859  . LORazepam (ATIVAN) tablet 1 mg  1 mg Oral Q4H PRN  Delfina Redwood, MD      . nortriptyline (PAMELOR) capsule 100 mg  100 mg Oral QHS Delfina Redwood, MD      . propranolol (INDERAL) tablet 20 mg  20 mg Oral BID Delfina Redwood, MD   20 mg at 12/19/13 0858  . tamsulosin (FLOMAX) capsule 0.4 mg  0.4 mg Oral QPC supper Delfina Redwood, MD   0.4 mg at 12/18/13 2043    Psychiatric Specialty Exam:     Blood pressure 161/110, pulse 109, temperature 98 F (36.7 C), temperature source Oral, resp. rate 18, height 5' 10"  (1.778 m), weight 163 lb 12.8 oz (74.3 kg), SpO2 99.00%.Body mass index is 23.5 kg/(m^2).  General Appearance: Casual  Eye Contact::  Fair  Speech:  Normal Rate  Volume:  Normal  Mood:  Anxious and Irritable  Affect:  Congruent and Constricted  Thought Process:  Coherent, Intact and Linear  Orientation:  Full (Time, Place, and Person)  Thought Content:  Rumination  Suicidal Thoughts:  No  Homicidal Thoughts:  No  Memory:  Immediate;   Fair Recent;   Fair Remote;   Fair  Judgement:  Fair  Insight:  Fair  Psychomotor Activity:  Increased  Concentration:  Fair  Recall:  Fair  Akathisia:  No  Handed:  Right  AIMS (if indicated):     Assets:  Communication Skills Desire for Improvement Social Support  Sleep:      Treatment Plan Summary: Patient does not meet criteria for inpatient psychiatric services.  His mental status is improved. he has capacity to participate in his treatment plan.  Start Neurontin 100 mg twice a day to help his anxiety and continue nortriptyline .  Patient can be seen at [facility] for outpatient services.  Social worker can arrange outpatient referrals.  Please contact [phone] further question.  ARFEEN,SYED T. 12/19/2013 2:06 PM

## 2013-12-19 NOTE — Progress Notes (Signed)
Pt threatening to sign out AMA; pt educated; MD made aware; will continue to monitor and educate pt appropriately;

## 2013-12-20 MED ORDER — TAMSULOSIN HCL 0.4 MG PO CAPS: 0.4000 mg | ORAL_CAPSULE | Freq: Every day | ORAL | Status: DC

## 2013-12-20 MED ORDER — PROPRANOLOL HCL 10 MG PO TABS
10.0000 mg | ORAL_TABLET | Freq: Two times a day (BID) | ORAL | Status: DC
  Filled 2013-12-20 (×2): qty 1

## 2013-12-20 MED ORDER — LISINOPRIL 20 MG PO TABS: 20.0000 mg | ORAL_TABLET | Freq: Every day | ORAL | Status: DC

## 2013-12-20 MED ORDER — NORTRIPTYLINE HCL 50 MG PO CAPS: 100.0000 mg | ORAL_CAPSULE | Freq: Every day | ORAL | Status: DC

## 2013-12-20 MED ORDER — ASPIRIN 81 MG PO TBEC: 81.0000 mg | DELAYED_RELEASE_TABLET | Freq: Every day | ORAL | Status: DC

## 2013-12-20 MED ORDER — PROPRANOLOL HCL 10 MG PO TABS: 10.0000 mg | ORAL_TABLET | Freq: Two times a day (BID) | ORAL | Status: DC

## 2013-12-20 NOTE — Progress Notes (Addendum)
   CARE MANAGEMENT NOTE 12/20/2013  Patient:  Christen ButterMOORE,Zaden   Account Number:  [id number]  Date Initiated:  12/20/2013  Documentation initiated by:  [facility]Calisa Luckenbaugh  Subjective/Objective Assessment:   delirum, HTN, possible stroke     Action/Plan:   lives in shelter   Anticipated DC Date:  12/20/2013   Anticipated DC Plan:  HOME/SELF CARE      DC Planning Services  CM consult      Choice offered to / List presented to:             Status of service:  Completed, signed off Medicare Important Message given?   (If response is "NO", the following Medicare IM given date fields will be blank) Date Medicare IM given:   Date Additional Medicare IM given:    Discharge Disposition:  HOME/SELF CARE  Per UR Regulation:    If discussed at Long Length of Stay Meetings, dates discussed:    Comments:  12/20/2013 4:06 PM  NCM spoke to pt and provided him with [facility]. Pt to call and arrange appt. Attempted to discuss with pt getting his medications. States he gets his coupons from [email or web address]. NCM asked if he could afford his medications. States "your information was stressing me out", requested NCM leave room. Pt in room talking on his cell phone and working on his computer.  Isidoro DonningAlesia Humbert Morozov RN CCM Case Mgmt phone [phone]

## 2013-12-20 NOTE — Progress Notes (Signed)
Zachary ButterKevin Wooding to be D/Russo'd Home per MD order.  Discussed with the patient and all questions fully answered.    Medication List         aspirin 81 MG EC tablet  Take 1 tablet (81 mg total) by mouth daily.     lisinopril 20 MG tablet  Commonly known as:  PRINIVIL,ZESTRIL  Take 1 tablet (20 mg total) by mouth daily.     nortriptyline 50 MG capsule  Commonly known as:  PAMELOR  Take 2 capsules (100 mg total) by mouth at bedtime.     propranolol 10 MG tablet  Commonly known as:  INDERAL  Take 1 tablet (10 mg total) by mouth 2 (two) times daily.     tamsulosin 0.4 MG Caps capsule  Commonly known as:  FLOMAX  Take 1 capsule (0.4 mg total) by mouth daily after supper.        VVS, Skin clean, dry and intact without evidence of skin break down, no evidence of skin tears noted. IV catheter discontinued intact. Site without signs and symptoms of complications. Dressing and pressure applied.  An After Visit Summary was printed and given to the patient.  D/Russo education completed with patient/family including follow up instructions, medication list, d/Russo activities limitations if indicated, with other d/Russo instructions as indicated by MD - patient able to verbalize understanding, all questions fully answered.   Patient instructed to return to ED, call 911, or call MD for any changes in condition.   Patient escorted via WC, and D/Russo home via private auto.  Zachary Russo, Zachary Russo 12/20/2013 3:58 PM

## 2013-12-20 NOTE — Discharge Summary (Signed)
Physician Discharge Summary  Zachary Russo ZOX:[id number] DOB: 07-15-1961 DOA: 12/18/2013  PCP: No PCP Per Patient  Admit date: 12/18/2013 Discharge date: 12/20/2013  Time spent: greater than 30 minutes  Discharge Diagnoses:  Active Problems:   Delirium   Malignant hypertension   Abnormal brain CT   Discharge Condition: stable  Filed Weights   12/18/13 1244 12/18/13 1747 12/19/13 1600  Weight: 74.707 kg (164 lb 11.2 oz) 74.3 kg (163 lb 12.8 oz) 76.3 kg (168 lb 3.4 oz)    History of present illness:   53 y.o. male  Brought to ED by a woman who said "he's crazy" and left. Initially was "speaking word salad". Patient was tachycardic into the 150s, blood pressure. 180/120. Heart rate and blood pressure decreased after receiving ativan, but now high again. CT brain shows old or subacute infarct. Labs ok, including blood alcohol. UDS ordered, but not yet collected. Patient received 2 mg IV ativan in ED. Now able to give some details of medical history, but still somewhat disoriented and unable to say why he was brought to the ED. Denies alcohol. Smokes marijuana occasionally. Reports he has a psychiatric history and was admitted to hospital in [place] "to get meds adusted". Asking if he needs to be commited to psych hospital. Admits to h/o Asberger's syndrome, HTN, anxiety. Lives in a homeless shelter and takes antihypertensives "as needed". Weaned off klonipin a few months ago.  Hospital Course:  Admitted to SDU. Mental status improved.  Psychiatry was consulted. Recommends bid gabapentin for anxiety and mood disorder.  Blood pressure medications adjusted.  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 Nothing acute.  Patient reports he will not take gabapentin, so Rx not given. Rx for antihypertensives given and recommend f/u [facility].  Procedures:  none  Consultations:  psychiatry  Discharge Exam: Filed Vitals:   12/20/13 1334  BP: 120/80  Pulse: 90  Temp: 98.2 F (36.8 C)  Resp: 18     General: cooperative. calm Cardiovascular: RRR Respiratory: CTA Psych. Alert. Oriented. calm  Discharge Instructions  Discharge Orders   Future Orders Complete By Expires   Activity as tolerated - No restrictions  As directed    Diet - low sodium heart healthy  As directed        Medication List         aspirin 81 MG EC tablet  Take 1 tablet (81 mg total) by mouth daily.     lisinopril 20 MG tablet  Commonly known as:  PRINIVIL,ZESTRIL  Take 1 tablet (20 mg total) by mouth daily.     nortriptyline 50 MG capsule  Commonly known as:  PAMELOR  Take 2 capsules (100 mg total) by mouth at bedtime.     propranolol 10 MG tablet  Commonly known as:  INDERAL  Take 1 tablet (10 mg total) by mouth 2 (two) times daily.       No Known Allergies     Follow-up Information   Follow up with [facility]    . (please call to arrange your appointment. )    Contact information:   [address] [place] [address] [phone]       The results of significant diagnostics from this hospitalization (including imaging, microbiology, ancillary and laboratory) are listed below for reference.    Significant Diagnostic Studies: Ct Head Wo Contrast  12/18/2013   CLINICAL DATA:  Altered mental status.  EXAM: CT HEAD WITHOUT CONTRAST  TECHNIQUE: Contiguous axial images were  obtained from the base of the skull through the vertex without intravenous contrast.  COMPARISON:  None.  FINDINGS: 10 mm hypodensity extending from the head of the left caudate nucleus into the anterior limb of the left internal capsule and globus pallidus nucleus, potentially chronic or subacute. Otherwise, the brainstem, cerebellum, cerebral peduncles, thalamus, basal ganglia, basilar cisterns, and ventricular system appear within normal limits. No intracranial mass lesion or intracranial hemorrhage.  IMPRESSION: 1. 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 Otherwise negative exam.   Electronically Signed   By: Herbie BaltimoreWalt  Liebkemann M.D.   On: 12/18/2013 14:20   Mr Brain Wo Contrast  12/19/2013   CLINICAL DATA:  Confusion.  Abnormal head CT.  EXAM: MRI HEAD WITHOUT CONTRAST  TECHNIQUE: Multiplanar, multiecho pulse sequences of the brain and surrounding structures were obtained without intravenous contrast.  COMPARISON:  Head CT 12/18/2013  FINDINGS: Diffusion imaging does not show any acute or subacute infarction. The brainstem is normal. The cerebellum is normal. There is an old infarction in the left basal ganglia/ posterior limb internal capsule. There are a few old small vessel insults in the deep white matter. No mass lesion, hemorrhage, hydrocephalus or extra-axial collection. No pituitary mass. No inflammatory sinus disease. No skull or skullbase lesion.  IMPRESSION: No acute or subacute insult.  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 Minor chronic small-vessel changes elsewhere and hemispheric white matter.   Electronically Signed   By: Paulina FusiMark  Shogry M.D.   On: 12/19/2013 18:55    Microbiology: Recent Results (from the past 240 hour(s))  MRSA PCR SCREENING     Status: None   Collection Time    12/18/13  7:04 PM      Result Value Range Status   MRSA by PCR NEGATIVE  NEGATIVE Final   Comment:            The GeneXpert MRSA Assay (FDA     approved for NASAL specimens     only), is one component of a     comprehensive MRSA colonization     surveillance program. It is not     intended to diagnose MRSA     infection nor to guide or     monitor treatment for     MRSA infections.     Labs: Basic Metabolic Panel:  Recent Labs Lab 12/18/13 1315  NA 143  K 4.2  CL 102  CO2 23  GLUCOSE 152*  BUN 14  CREATININE 0.87  CALCIUM 9.4   Liver Function Tests:  Recent Labs Lab 12/18/13 1315  AST 21  ALT 18  ALKPHOS 113  BILITOT  0.4  PROT 8.3  ALBUMIN 4.8   No results found for this basename: LIPASE, AMYLASE,  in the last 168 hours No results found for this basename: AMMONIA,  in the last 168 hours CBC:  Recent Labs Lab 12/18/13 1315 12/19/13 0400  WBC 15.5* 10.5  NEUTROABS 12.3* 7.8*  HGB 16.3 13.3  HCT 47.5 40.0  MCV 91.7 93.2  PLT 295 232   Cardiac Enzymes:  Recent Labs Lab 12/18/13 1315  CKTOTAL 240*  CKMB 3.9   BNP: BNP (last 3 results) No results found for this basename: PROBNP,  in the last 8760 hours CBG: No results found for this basename: GLUCAP,  in the last 168 hours     Signed:  Melvinia Ashby L  [facility] 12/20/2013, 2:53  PM

## 2013-12-23 ENCOUNTER — Encounter (HOSPITAL_COMMUNITY): Payer: Self-pay | Admitting: Emergency Medicine

## 2013-12-23 ENCOUNTER — Emergency Department (HOSPITAL_COMMUNITY)
Admission: EM | Admit: 2013-12-23 | Discharge: 2013-12-24 | Disposition: A | Discharge status: Home / Self Care | Payer: Self-pay | Attending: Emergency Medicine | Admitting: Emergency Medicine

## 2013-12-23 DIAGNOSIS — Z7982 Long term (current) use of aspirin: Secondary | ICD-10-CM | POA: Insufficient documentation

## 2013-12-23 DIAGNOSIS — R51 Headache: Secondary | ICD-10-CM | POA: Insufficient documentation

## 2013-12-23 DIAGNOSIS — F411 Generalized anxiety disorder: Secondary | ICD-10-CM | POA: Insufficient documentation

## 2013-12-23 DIAGNOSIS — R451 Restlessness and agitation: Secondary | ICD-10-CM

## 2013-12-23 DIAGNOSIS — Z8546 Personal history of malignant neoplasm of prostate: Secondary | ICD-10-CM | POA: Insufficient documentation

## 2013-12-23 DIAGNOSIS — Z79899 Other long term (current) drug therapy: Secondary | ICD-10-CM | POA: Insufficient documentation

## 2013-12-23 DIAGNOSIS — I10 Essential (primary) hypertension: Secondary | ICD-10-CM | POA: Insufficient documentation

## 2013-12-23 DIAGNOSIS — R000 Tachycardia, unspecified: Secondary | ICD-10-CM | POA: Insufficient documentation

## 2013-12-23 DIAGNOSIS — IMO0002 Reserved for concepts with insufficient information to code with codable children: Secondary | ICD-10-CM | POA: Insufficient documentation

## 2013-12-23 HISTORY — DX: Disorder of prostate, unspecified: N42.9

## 2013-12-23 MED ORDER — LORAZEPAM 2 MG/ML IJ SOLN
1.0000 mg | Freq: Once | INTRAMUSCULAR | Status: AC
  Administered 2013-12-24: 1 mg via INTRAVENOUS
  Filled 2013-12-23: qty 1

## 2013-12-23 MED ORDER — SODIUM CHLORIDE 0.9 % IV BOLUS (SEPSIS)
1000.0000 mL | Freq: Once | INTRAVENOUS | Status: AC
  Administered 2013-12-24: 1000 mL via INTRAVENOUS

## 2013-12-23 NOTE — ED Notes (Signed)
Pt chattering nonstop, repeating self, acting manic.  C/O head pain posterior head.  Wants to be checked out physically.

## 2013-12-24 ENCOUNTER — Emergency Department (HOSPITAL_COMMUNITY): Payer: Self-pay

## 2013-12-24 LAB — COMPREHENSIVE METABOLIC PANEL
ALK PHOS: 95 U/L (ref 39–117)
ALT: 21 U/L (ref 0–53)
AST: 18 U/L (ref 0–37)
Albumin: 4.4 g/dL (ref 3.5–5.2)
BILIRUBIN TOTAL: 0.3 mg/dL (ref 0.3–1.2)
BUN: 12 mg/dL (ref 6–23)
CO2: 24 mEq/L (ref 19–32)
CREATININE: 0.76 mg/dL (ref 0.50–1.35)
Calcium: 9.4 mg/dL (ref 8.4–10.5)
Chloride: 100 mEq/L (ref 96–112)
GFR calc non Af Amer: >90 mL/min (ref >90)
GLUCOSE: 114 mg/dL — AB (ref 70–99)
POTASSIUM: 3.5 meq/L — AB (ref 3.7–5.3)
Sodium: 139 mEq/L (ref 137–147)
TOTAL PROTEIN: 7.8 g/dL (ref 6.0–8.3)

## 2013-12-24 LAB — CBC WITH DIFFERENTIAL/PLATELET
Basophils Absolute: 0 10*3/uL (ref 0.0–0.1)
Basophils Relative: 0 % (ref 0–1)
EOS PCT: 0 % (ref 0–5)
Eosinophils Absolute: 0 10*3/uL (ref 0.0–0.7)
HEMATOCRIT: 44 % (ref 39.0–52.0)
HEMOGLOBIN: 15.3 g/dL (ref 13.0–17.0)
LYMPHS ABS: 2.3 10*3/uL (ref 0.7–4.0)
Lymphocytes Relative: 23 % (ref 12–46)
MCH: 31.4 pg (ref 26.0–34.0)
MCHC: 34.8 g/dL (ref 30.0–36.0)
MCV: 90.3 fL (ref 78.0–100.0)
MONOS PCT: 7 % (ref 3–12)
Monocytes Absolute: 0.7 10*3/uL (ref 0.1–1.0)
NEUTROS PCT: 70 % (ref 43–77)
Neutro Abs: 7.1 10*3/uL (ref 1.7–7.7)
Platelets: 292 10*3/uL (ref 150–400)
RBC: 4.87 MIL/uL (ref 4.22–5.81)
RDW: 13.7 % (ref 11.5–15.5)
WBC: 10.1 10*3/uL (ref 4.0–10.5)

## 2013-12-24 LAB — URINALYSIS, ROUTINE W REFLEX MICROSCOPIC
BILIRUBIN URINE: NEGATIVE
GLUCOSE, UA: NEGATIVE mg/dL
HGB URINE DIPSTICK: NEGATIVE
Ketones, ur: NEGATIVE mg/dL
Nitrite: NEGATIVE
Protein, ur: NEGATIVE mg/dL
SPECIFIC GRAVITY, URINE: 1.017 (ref 1.005–1.030)
Urobilinogen, UA: 1 mg/dL (ref 0.0–1.0)
pH: 7 (ref 5.0–8.0)

## 2013-12-24 LAB — SALICYLATE LEVEL

## 2013-12-24 LAB — RAPID URINE DRUG SCREEN, HOSP PERFORMED
Amphetamines: NOT DETECTED
Barbiturates: NOT DETECTED
Benzodiazepines: NOT DETECTED
COCAINE: NOT DETECTED
Opiates: NOT DETECTED
Tetrahydrocannabinol: POSITIVE — AB

## 2013-12-24 LAB — URINE MICROSCOPIC-ADD ON

## 2013-12-24 LAB — ACETAMINOPHEN LEVEL: Acetaminophen (Tylenol), Serum: <15 ug/mL (ref 10–30)

## 2013-12-24 LAB — ETHANOL: Alcohol, Ethyl (B): <11 mg/dL (ref 0–11)

## 2013-12-24 MED ORDER — IBUPROFEN 200 MG PO TABS: 600.0000 mg | ORAL_TABLET | Freq: Three times a day (TID) | ORAL | Status: DC | PRN

## 2013-12-24 MED ORDER — LORAZEPAM 1 MG PO TABS: 1.0000 mg | ORAL_TABLET | Freq: Three times a day (TID) | ORAL | Status: DC | PRN

## 2013-12-24 MED ORDER — ACETAMINOPHEN 325 MG PO TABS: 650.0000 mg | ORAL_TABLET | ORAL | Status: DC | PRN

## 2013-12-24 NOTE — ED Provider Notes (Addendum)
CSN: [id number]     Arrival date & time 12/23/13  2254 History   First MD Initiated Contact with Patient 12/23/13 2311     Chief Complaint  Patient presents with  . Altered Mental Status   (Consider location/radiation/quality/duration/timing/severity/associated sxs/prior Treatment) HPI  This is a 53 year old male with a history of asberger's and hypertension who presents by EMS. Patient has. Tangential in speech and it is unclear exactly why he is here. He states that he wants to be checked out "both physically and psychologically." He has no physical complaints at this time including chest pain, shortness of breath, fevers, weakness or numbness. Patient does report headache. Patient states that he is currently living in "[facility]" but that he does not like it there and wants to stay "here in the hospital."  I reviewed the patient's chart he had a recent admission for similar presentation of word salad and tachycardia. He was medically admitted given his tachycardia and evaluated by psychiatry at that time. He was not felt to meet inpatient criteria.  Past Medical History  Diagnosis Date  . Hypertension   . Prostate disorder    History reviewed. No pertinent past surgical history. History reviewed. No pertinent family history. History  Substance Use Topics  . Smoking status: Never Smoker   . Smokeless tobacco: Not on file  . Alcohol Use: No    Review of Systems  Constitutional: Negative.  Negative for fever.  Respiratory: Negative.  Negative for chest tightness and shortness of breath.   Cardiovascular: Negative.  Negative for chest pain.  Gastrointestinal: Negative.  Negative for abdominal pain.  Genitourinary: Negative.  Negative for dysuria.  Musculoskeletal: Negative for back pain.  Skin: Negative for rash.  Neurological: Positive for headaches.  Psychiatric/Behavioral: Negative for suicidal ideas and hallucinations. The patient is nervous/anxious.   All other systems  reviewed and are negative.    Allergies  Review of patient's allergies indicates no known allergies.  Home Medications   Current Outpatient Rx  Name  Route  Sig  Dispense  Refill  . aspirin EC 81 MG EC tablet   Oral   Take 1 tablet (81 mg total) by mouth daily.         Marland Kitchen lisinopril (PRINIVIL,ZESTRIL) 20 MG tablet   Oral   Take 1 tablet (20 mg total) by mouth daily.         . nortriptyline (PAMELOR) 50 MG capsule   Oral   Take 2 capsules (100 mg total) by mouth at bedtime.   60 capsule   0   . propranolol (INDERAL) 10 MG tablet   Oral   Take 1 tablet (10 mg total) by mouth 2 (two) times daily.   60 tablet   0   . tamsulosin (FLOMAX) 0.4 MG CAPS capsule   Oral   Take 1 capsule (0.4 mg total) by mouth daily after supper.   30 capsule   0    BP 159/104  Pulse 113  Temp(Src) 98.6 F (37 C) (Oral)  Resp 21  Ht 5\' 10"  (1.778 m)  Wt 162 lb (73.483 kg)  BMI 23.24 kg/m2  SpO2 97% Physical Exam  Nursing note and vitals reviewed. Constitutional: He is oriented to person, place, and time. He appears well-developed and well-nourished.  Agitated, restless  HENT:  Head: Normocephalic and atraumatic.  Eyes: Pupils are equal, round, and reactive to light.  Neck: Neck supple.  Cardiovascular: Regular rhythm and normal heart sounds.   No murmur heard. Tachycardia  Pulmonary/Chest: Effort normal and breath sounds normal. No respiratory distress. He has no wheezes.  Abdominal: Soft. Bowel sounds are normal. There is no tenderness. There is no rebound.  Musculoskeletal: He exhibits no edema.  Lymphadenopathy:    He has no cervical adenopathy.  Neurological: He is alert and oriented to person, place, and time. No cranial nerve deficit.  5/5 strength in all 4 extremities  Skin: Skin is warm and dry.  Psychiatric:  Tangential speech, flight of ideas, difficult to redirect    ED Course  Procedures (including critical care time) Labs Review Labs Reviewed   COMPREHENSIVE METABOLIC PANEL - Abnormal; Notable for the following:    Potassium 3.5 (*)    Glucose, Bld 114 (*)    All other components within normal limits  URINE RAPID DRUG SCREEN (HOSP PERFORMED) - Abnormal; Notable for the following:    Tetrahydrocannabinol POSITIVE (*)    All other components within normal limits  URINALYSIS, ROUTINE W REFLEX MICROSCOPIC - Abnormal; Notable for the following:    Leukocytes, UA SMALL (*)    All other components within normal limits  SALICYLATE LEVEL - Abnormal; Notable for the following:    Salicylate Lvl <2.0 (*)    All other components within normal limits  CBC WITH DIFFERENTIAL  ETHANOL  ACETAMINOPHEN LEVEL  URINE MICROSCOPIC-ADD ON   Imaging Review Ct Head Wo Contrast  12/24/2013   CLINICAL DATA:  Posterior head pain.  EXAM: CT HEAD WITHOUT CONTRAST  TECHNIQUE: Contiguous axial images were obtained from the base of the skull through the vertex without intravenous contrast.  COMPARISON:  MRI of the brain December 19, 2013  FINDINGS: Ventricles and sulci are normal for patient's age. Left basal ganglia/corona radiata lacunar infarct again noted with mild ex vacuo dilatation subjacent ventricle. No intraparenchymal hemorrhage, mass effect or midline shift. No acute large vascular territory infarct. Basal cisterns are patent.  Mild calcific atherosclerosis of the carotid siphons. Visualized paranasal sinuses and mastoid air cells are well aerated. No skull fracture.  IMPRESSION: No acute intracranial process.  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   Electronically Signed   By: Awilda Metro   On: 12/24/2013 01:26    EKG Interpretation   None       MDM  No diagnosis found.  Acute agitation Tachycardia, resolved  Patient presents by EMS and states that he wants to be "checked out medically and psychologically." Initial vital signs notable for tachycardia and  hypertension. The patient is agitated on exam and very tangential. He will not directly answer questions. He does deny suicidal and homicidal ideation. Basic labwork was obtained and is remarkable only for positive UDS for marijuana. Patient endorses recent use. CT scan of the head is negative for acute abnormality. Patient was given a normal saline bolus and Ativan for his tachycardia. He was just admitted and had a full workup for a similar presentation.  Her rate improved while in the ED. I have consulted TTS to evaluate the patient given his bizarre presentation and acute agitation.    Shon Baton, MD 12/24/13 0230  Patient is requesting discharge. He has been told he will not be able to use his phone while awaiting TTS evaluation. He is awake, alert, and oriented. His thought are more organized and he tells me that he just doesn't like being at the [facility] but he will "deal with that in the morning." He continues to  deny SI or HI. Tachycardia has improved.  Shon Batonourtney F Parul Porcelli, MD 12/24/13 [phone]

## 2013-12-24 NOTE — ED Notes (Signed)
Patient transported to CT 

## 2013-12-24 NOTE — ED Notes (Signed)
Pt alert, aware of surroundings and is able to tell me why he is here, reports he had a stroke last week and today has a little headache.  He feels he needs to be checked out physically.  While oriented, pt is repeating words 3-5 times, tells himself to stop talking, tells me to talk to him to calm him, tells me he is "gay gay gay as the sky is blue blue blue" etc.

## 2013-12-24 NOTE — Discharge Instructions (Signed)
Nonspecific Tachycardia Tachycardia is a faster than normal heartbeat (more than 100 beats per minute). In adults, the heart normally beats between 60 and 100 times a minute. A fast heartbeat may be a normal response to exercise or stress. It does not necessarily mean that something is wrong. However, sometimes when your heart beats too fast it may not be able to pump enough blood to the rest of your body. This can result in chest pain, shortness of breath, dizziness, and even fainting. Nonspecific tachycardia means that the specific cause or pattern of your tachycardia is unknown. CAUSES  Tachycardia may be harmless or it may be due to a more serious underlying cause. Possible causes of tachycardia include:  Exercise or exertion.  Fever.  Pain or injury.  Infection.  Loss of body fluids (dehydration).  Overactive thyroid.  Lack of red blood cells (anemia).  Anxiety and stress.  Alcohol.  Caffeine.  Tobacco products.  Diet pills.  Illegal drugs.  Heart disease. SYMPTOMS  Rapid or irregular heartbeat (palpitations).  Suddenly feeling your heart beating (cardiac awareness).  Dizziness.  Tiredness (fatigue).  Shortness of breath.  Chest pain.  Nausea.  Fainting. DIAGNOSIS  Your caregiver will perform a physical exam and take your medical history. In some cases, a heart specialist (cardiologist) may be consulted. Your caregiver may also order:  Blood tests.  Electrocardiography. This test records the electrical activity of your heart.  A heart monitoring test. TREATMENT  Treatment will depend on the likely cause of your tachycardia. The goal is to treat the underlying cause of your tachycardia. Treatment methods may include:  Replacement of fluids or blood through an intravenous (IV) tube for moderate to severe dehydration or anemia.  New medicines or changes in your current medicines.  Diet and lifestyle changes.  Treatment for certain  infections.  Stress relief or relaxation methods. HOME CARE INSTRUCTIONS   Rest.  Drink enough fluids to keep your urine clear or pale yellow.  Do not smoke.  Avoid:  Caffeine.  Tobacco.  Alcohol.  Chocolate.  Stimulants such as over-the-counter diet pills or pills that help you stay awake.  Situations that cause anxiety or stress.  Illegal drugs such as marijuana, phencyclidine (PCP), and cocaine.  Only take medicine as directed by your caregiver.  Keep all follow-up appointments as directed by your caregiver. SEEK IMMEDIATE MEDICAL CARE IF:   You have pain in your chest, upper arms, jaw, or neck.  You become weak, dizzy, or feel faint.  You have palpitations that will not go away.  You vomit, have diarrhea, or pass blood in your stool.  Your skin is cool, pale, and wet.  You have a fever that will not go away with rest, fluids, and medicine. MAKE SURE YOU:   Understand these instructions.  Will watch your condition.  Will get help right away if you are not doing well or get worse. Document Released: 01/12/2005 Document Revised: 02/27/2012 Document Reviewed: 11/15/2011 [employer] Patient Information 2014 [employer].  

## 2013-12-24 NOTE — Progress Notes (Signed)
Spoke with RN to set up teleassessment.  RN states that pt is discharging stating that if he can't keep his cell phone he wants to leave.

## 2013-12-24 NOTE — ED Notes (Signed)
Pt mental status much more calm, not repeating himself as much.  Has been speaking to someone on his cell phone for quite a while, seemed to be making sense.  Is acutely aware of his mental state and recalls all events leading up to his hospitalization.  Pt to be psych hold for TTS evaluation.

## 2014-01-09 ENCOUNTER — Encounter (HOSPITAL_COMMUNITY): Payer: Self-pay | Admitting: Emergency Medicine

## 2014-01-09 ENCOUNTER — Emergency Department (HOSPITAL_COMMUNITY)
Admission: EM | Admit: 2014-01-09 | Discharge: 2014-01-09 | Disposition: A | Discharge status: Home / Self Care | Payer: Self-pay | Attending: Emergency Medicine | Admitting: Emergency Medicine

## 2014-01-09 DIAGNOSIS — F419 Anxiety disorder, unspecified: Secondary | ICD-10-CM

## 2014-01-09 DIAGNOSIS — Z79899 Other long term (current) drug therapy: Secondary | ICD-10-CM | POA: Insufficient documentation

## 2014-01-09 DIAGNOSIS — Z5900 Homelessness unspecified: Secondary | ICD-10-CM | POA: Insufficient documentation

## 2014-01-09 DIAGNOSIS — Z87448 Personal history of other diseases of urinary system: Secondary | ICD-10-CM | POA: Insufficient documentation

## 2014-01-09 DIAGNOSIS — I10 Essential (primary) hypertension: Secondary | ICD-10-CM | POA: Insufficient documentation

## 2014-01-09 DIAGNOSIS — R000 Tachycardia, unspecified: Secondary | ICD-10-CM | POA: Insufficient documentation

## 2014-01-09 DIAGNOSIS — F411 Generalized anxiety disorder: Secondary | ICD-10-CM | POA: Insufficient documentation

## 2014-01-09 HISTORY — DX: Anxiety disorder, unspecified: F41.9

## 2014-01-09 MED ORDER — LORAZEPAM 1 MG PO TABS: 1.0000 mg | ORAL_TABLET | Freq: Three times a day (TID) | ORAL | Status: DC | PRN

## 2014-01-09 NOTE — ED Notes (Signed)
Pt reports he has aspberger's and is staying [facility] the homeless shelter. He reports being around so many people is very anxiety producing for him. He is not currently taking any psych meds at this time. Reports he is not suicidal or homicidal at this time.
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
## 2014-01-09 NOTE — Discharge Instructions (Signed)
Panic Attacks °Panic attacks are sudden, short-lived surges of severe anxiety, fear, or discomfort. They may occur for no reason when you are relaxed, when you are anxious, or when you are sleeping. Panic attacks may occur for a number of reasons:  °· Healthy people occasionally have panic attacks in extreme, life-threatening situations, such as war or natural disasters. Normal anxiety is a protective mechanism of the body that helps us react to danger (fight or flight response). °· Panic attacks are often seen with anxiety disorders, such as panic disorder, social anxiety disorder, generalized anxiety disorder, and phobias. Anxiety disorders cause excessive or uncontrollable anxiety. They may interfere with your relationships or other life activities. °· Panic attacks are sometimes seen with other mental illnesses such as depression and posttraumatic stress disorder. °· Certain medical conditions, prescription medicines, and drugs of abuse can cause panic attacks. °SYMPTOMS  °Panic attacks start suddenly, peak within 20 minutes, and are accompanied by four or more of the following symptoms: °· Pounding heart or fast heart rate (palpitations). °· Sweating. °· Trembling or shaking. °· Shortness of breath or feeling smothered. °· Feeling choked. °· Chest pain or discomfort. °· Nausea or strange feeling in your stomach. °· Dizziness, lightheadedness, or feeling like you will faint. °· Chills or hot flushes. °· Numbness or tingling in your lips or hands and feet. °· Feeling that things are not real or feeling that you are not yourself. °· Fear of losing control or going crazy. °· Fear of dying. °Some of these symptoms can mimic serious medical conditions. For example, you may think you are having a heart attack. Although panic attacks can be very scary, they are not life threatening. °DIAGNOSIS  °Panic attacks are diagnosed through an assessment by your health care provider. Your health care provider will ask questions  about your symptoms, such as where and when they occurred. Your health care provider will also ask about your medical history and use of alcohol and drugs, including prescription medicines. Your health care provider may order blood tests or other studies to rule out a serious medical condition. Your health care provider may refer you to a mental health professional for further evaluation. °TREATMENT  °· Most healthy people who have one or two panic attacks in an extreme, life-threatening situation will not require treatment. °· The treatment for panic attacks associated with anxiety disorders or other mental illness typically involves counseling with a mental health professional, medicine, or a combination of both. Your health care provider will help determine what treatment is best for you. °· Panic attacks due to physical illness usually goes away with treatment of the illness. If prescription medicine is causing panic attacks, talk with your health care provider about stopping the medicine, decreasing the dose, or substituting another medicine. °· Panic attacks due to alcohol or drug abuse goes away with abstinence. Some adults need professional help in order to stop drinking or using drugs. °HOME CARE INSTRUCTIONS  °· Take all your medicines as prescribed.   °· Check with your health care provider before starting new prescription or over-the-counter medicines. °· Keep all follow up appointments with your health care provider. °SEEK MEDICAL CARE IF: °· You are not able to take your medicines as prescribed. °· Your symptoms do not improve or get worse. °SEEK IMMEDIATE MEDICAL CARE IF:  °· You experience panic attack symptoms that are different than your usual symptoms. °· You have serious thoughts about hurting yourself or others. °· You are taking medicine for panic attacks and   have a serious side effect. °MAKE SURE YOU: °· Understand these instructions. °· Will watch your condition. °· Will get help right away  if you are not doing well or get worse. °Document Released: 12/05/2005 Document Revised: 09/25/2013 Document Reviewed: 07/19/2013 °Exit[employer]® Patient Information ©2014 [employer]. ° °Social Anxiety Disorder °Social anxiety disorder, previously called social phobia, is a mental disorder. People with social anxiety disorder frequently feel nervous, afraid, or embarrassed when around other people in social situations. They constantly worry that other people are judging or criticizing them for how they look, what they say, or how they act. They may worry that other people might reject them because of their appearance or behavior. °Social anxiety disorder is more than just occasional shyness or self-consciousness. It can cause severe emotional distress. It can interfere with daily life activities. Social anxiety disorder also may lead to excessive alcohol or drug use and even suicide.  °Social anxiety disorder is actually one of the most common mental disorders. It can develop at any time but usually starts in the teenage years. Women are more commonly affected than men. Social anxiety disorder is also more common in people who have family members with anxiety disorders. It also is more common in people who have physical deformities or conditions with characteristics that are obvious to others, such as stuttered speech or movement abnormalities (Parkinson disease).  °SYMPTOMS  °In addition to feeling anxious or fearful in social situations, people with social anxiety disorder frequently have physical symptoms. Examples include: °· Red face (blushing). °· Racing heart. °· Sweating. °· Shaky hands or voice. °· Confusion. °· Lightheadedness. °· Upset stomach and diarrhea. °DIAGNOSIS  °Social anxiety disorder is diagnosed through an assessment by your caregiver. Your caregiver will ask you questions about your mood, thoughts, and reactions in social situations. Your caregiver may ask you about your medical history and use  of alcohol or drugs, including prescription medications. Certain medical conditions and the use of certain substances, including caffeine, can cause symptoms similar to social anxiety disorder. Your caregiver may refer you to a mental health specialist for further evaluation or treatment. °The criteria for diagnosis of social anxiety disorder are: °· Marked fear or anxiety in one or more social situations in which you may be closely watched or studied by others. Examples of such situations include: °· Interacting socially (having a conversation with others, going to a party, or meeting strangers). °· Being observed (eating or drinking in public or being called on in class). °· Performing in front of others (giving a speech). °· The social situations of concern almost always cause fear or anxiety, not just occasionally. °· People with social anxiety disorder fear that they will be viewed negatively in a way that will be embarrassing, will lead to rejection, or will offend others. This fear is out of proportion to the actual threat posed by the social situation. °· Often the triggering social situations are avoided, or they are endured with intense fear or anxiety. The fear, anxiety, or avoidance is persistent and lasts for 6 months or longer. °· The anxiety causes difficulty functioning in at least some parts of your daily life. °TREATMENT  °Several types of treatment are available for social anxiety disorder. These treatments are often used in combination and include:  °· Talk therapy. Group talk therapy allows you to see that you are not alone with these problems. Individual talk therapy helps you address your specific anxiety issues with a caring professional. The most effective forms of   talk therapy for social anxiety disorder are cognitive behavioral therapy and exposure therapy. Cognitive behavioral therapy helps you to identify and change negative thoughts and beliefs that are at the root of the disorder.  Exposure therapy allows you to gradually face the situations that you fear most. °· Relaxation and coping techniques. These include deep breathing, self-talk, meditation, visual imagery, and yoga. Relaxation techniques help to keep you calm in social situations. °· Social skills training. Social skills can be learned on your own or with the help of a talk therapist. They can help you feel more confident and comfortable in social situations. °· Medication. For anxiety limited to performance situations (performance anxiety), medication called beta blockers can help by reducing or preventing the physical symptoms of social anxiety disorder. For more persistent and generalized social anxiety, antidepressant medication may be prescribed to help control symptoms. In severe cases of social anxiety disorder, strong antianxiety medication, called benzodiazepines, may be prescribed on a limited basis and for a short time. °Document Released: 11/03/2005 Document Revised: 04/01/2013 Document Reviewed: 03/05/2013 °ExitCare® Patient Information ©2014 [employer]. ° °

## 2014-01-09 NOTE — ED Notes (Signed)
MD at bedside. 

## 2014-01-09 NOTE — ED Provider Notes (Signed)
Patient seen/examined in the Emergency Department in conjunction with Midlevel Provider [name] Patient reports anxiety Exam : awake/alert, no distress, he does not appear psychotic, no SI reported Plan: short course of benzo and f/u as outpatient    Zachary Gaskinsonald W Jahred Tatar, MD 01/09/14 1815

## 2014-01-09 NOTE — ED Notes (Signed)
PT ambulated with baseline gait; VSS; A&Ox3; no signs of distress; respirations even and unlabored; skin warm and dry; no questions upon discharge.  

## 2014-01-09 NOTE — ED Notes (Signed)
Pt reports he is having severe anxiety due to recent life stressors and he is unable to take the medication he was prescribed for anxiety because it makes his mouth draw up. [facility] but they have not helped.

## 2014-01-10 NOTE — ED Provider Notes (Signed)
Medical screening examination/treatment/procedure(s) were conducted as a shared visit with non-physician practitioner(s) and myself.  I personally evaluated the patient during the encounter.  EKG Interpretation   None        Pt well appearing on my evaluation Resting comfortably, no distress noted Stable for outpatient management   Joya Gaskinsonald W Mahalie Kanner, MD 01/10/14 1137

## 2015-01-12 ENCOUNTER — Emergency Department (HOSPITAL_COMMUNITY)
Admission: EM | Admit: 2015-01-12 | Discharge: 2015-01-12 | Disposition: A | Discharge status: Home / Self Care | Payer: Self-pay | Attending: Emergency Medicine | Admitting: Emergency Medicine

## 2015-01-12 ENCOUNTER — Encounter (HOSPITAL_COMMUNITY): Payer: Self-pay | Admitting: Emergency Medicine

## 2015-01-12 DIAGNOSIS — R000 Tachycardia, unspecified: Secondary | ICD-10-CM | POA: Insufficient documentation

## 2015-01-12 DIAGNOSIS — F419 Anxiety disorder, unspecified: Secondary | ICD-10-CM | POA: Insufficient documentation

## 2015-01-12 DIAGNOSIS — R110 Nausea: Secondary | ICD-10-CM

## 2015-01-12 DIAGNOSIS — Z760 Encounter for issue of repeat prescription: Secondary | ICD-10-CM | POA: Insufficient documentation

## 2015-01-12 DIAGNOSIS — I10 Essential (primary) hypertension: Secondary | ICD-10-CM | POA: Insufficient documentation

## 2015-01-12 DIAGNOSIS — G479 Sleep disorder, unspecified: Secondary | ICD-10-CM | POA: Insufficient documentation

## 2015-01-12 DIAGNOSIS — Z8659 Personal history of other mental and behavioral disorders: Secondary | ICD-10-CM | POA: Insufficient documentation

## 2015-01-12 DIAGNOSIS — Z87438 Personal history of other diseases of male genital organs: Secondary | ICD-10-CM | POA: Insufficient documentation

## 2015-01-12 DIAGNOSIS — R630 Anorexia: Secondary | ICD-10-CM | POA: Insufficient documentation

## 2015-01-12 DIAGNOSIS — Z79899 Other long term (current) drug therapy: Secondary | ICD-10-CM | POA: Insufficient documentation

## 2015-01-12 HISTORY — DX: Asperger's syndrome: F84.5

## 2015-01-12 MED ORDER — ONDANSETRON 4 MG PO TBDP
4.0000 mg | ORAL_TABLET | Freq: Once | ORAL | Status: AC
  Administered 2015-01-12: 4 mg via ORAL
  Filled 2015-01-12: qty 1

## 2015-01-12 MED ORDER — CLONAZEPAM 0.5 MG PO TABS: 1.0000 mg | ORAL_TABLET | Freq: Every day | ORAL | Status: DC

## 2015-01-12 MED ORDER — ONDANSETRON HCL 4 MG PO TABS: 4.0000 mg | ORAL_TABLET | Freq: Four times a day (QID) | ORAL | Status: DC

## 2015-01-12 MED ORDER — CLONAZEPAM 0.5 MG PO TABS
1.0000 mg | ORAL_TABLET | Freq: Once | ORAL | Status: AC
  Administered 2015-01-12: 1 mg via ORAL
  Filled 2015-01-12: qty 2

## 2015-01-12 NOTE — ED Notes (Addendum)
Pt reports his regular Psychiatrist is Joanne CharsJennifer Jackson is on maternity leave . The Psychiatrist covering for her want to change Pt medications. Pt is upset that the temporary MD wants to change His Plan of Care.Pt arrived to ED anxious and is requesting a refill on the clonazepam. Pt denies any SI or HI .
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
## 2015-01-12 NOTE — ED Notes (Signed)
Declined W/C at D/C and was escorted to lobby by RN. 

## 2015-01-12 NOTE — ED Notes (Signed)
Pt visibly upset, crying. Pt states "I am clinically depressed, I was diagnosed with [place]" Pt states he "had thoughts of hurting himself last night" RN Magda Paganiniudrey now in room

## 2015-01-12 NOTE — Discharge Instructions (Signed)
Return to the Emergency Department immediately if you develop thoughts of hurting yourself or others. °

## 2015-01-12 NOTE — ED Provider Notes (Signed)
CSN: [id number]     Arrival date & time 01/12/15  1252 History   This chart was scribed for non-physician practitioner, Santiago Glad, PA-C,  working with Rolan Bucco, MD, by Lionel December, ED Scribe. This patient was seen in room TR10C/TR10C and the patient's care was started at 4:51 PM.   First MD Initiated Contact with Patient 01/12/15 1613     Chief Complaint  Patient presents with  . Nausea  . Anxiety     (Consider location/radiation/quality/duration/timing/severity/associated sxs/prior Treatment) The history is provided by the patient and a friend. No language interpreter was used.    HPI Comments: Zachary Russo is a 54 y.o. male who presents to the Emergency Department complaining of anxiety.  Patient  states he has had anxiety and stress over a lot of life events/ relationship issues, money issues, job issues .  States his blood pressure is elevated due to anxiety.   Patient is having trouble sleeping, nausea, and lack of appetite.   Denies vomiting but states that he was close to it this morning.  Denies diarrhea or abdominal pain.    Anxiety: Patient was seeing a psychiatrist who left for maternity leave and saw a new psychiatrist for the first time today.  Patient notes that his new doctor told him that he didn't need to be on Klonopin for his particular complaints so patient is here today for Klonopin prescription because he states that is what works for him.  Notes last night was his first night without Klonopin.   Denies SI or HI.          Past Medical History  Diagnosis Date  . Hypertension   . Prostate disorder   . Anxiety   . Asperger syndrome    History reviewed. No pertinent past surgical history. History reviewed. No pertinent family history. History  Substance Use Topics  . Smoking status: Never Smoker   . Smokeless tobacco: Not on file  . Alcohol Use: No    Review of Systems  Constitutional: Positive for appetite change.  Gastrointestinal:  Positive for nausea. Negative for vomiting.  Psychiatric/Behavioral: Positive for sleep disturbance. Negative for self-injury.  All other systems reviewed and are negative.     Allergies  Cephalexin and Codeine  Home Medications   Prior to Admission medications   Medication Sig Start Date End Date Taking? Authorizing Provider  lisinopril (PRINIVIL,ZESTRIL) 20 MG tablet Take 1 tablet (20 mg total) by mouth daily. 12/20/13   Christiane Ha, MD  LORazepam (ATIVAN) 1 MG tablet Take 1 tablet (1 mg total) by mouth 3 (three) times daily as needed for anxiety. 01/09/14   Robyn M Hess, PA-C  propranolol (INDERAL) 10 MG tablet Take 1 tablet (10 mg total) by mouth 2 (two) times daily. 12/20/13   Christiane Ha, MD  tamsulosin (FLOMAX) 0.4 MG CAPS capsule Take 1 capsule (0.4 mg total) by mouth daily after supper. 12/20/13   Christiane Ha, MD   BP 150/114 mmHg  Pulse 134  Temp(Src) 98.1 F (36.7 C) (Oral)  Resp 18  Ht  (1.727 m)  Wt 147 lb (66.679 kg)  BMI 22.36 kg/m2  SpO2 97% Physical Exam  Constitutional: He appears well-developed and well-nourished. No distress.  HENT:  Head: Normocephalic and atraumatic.  Mouth/Throat: Oropharynx is clear and moist.  Neck: Normal range of motion. Neck supple.  Cardiovascular: Regular rhythm and normal heart sounds.  Tachycardia present.   Pulmonary/Chest: Effort normal and breath sounds normal.  Abdominal: Soft. Bowel  sounds are normal. He exhibits no distension and no mass. There is no tenderness. There is no rebound and no guarding.  Musculoskeletal: Normal range of motion.  Neurological: He is alert.  Skin: Skin is warm and dry. He is not diaphoretic.  Psychiatric: His mood appears anxious. His speech is tangential. He is not actively hallucinating. Thought content is not paranoid and not delusional. He expresses no homicidal and no suicidal ideation. He expresses no suicidal plans and no homicidal plans.  Nursing note and vitals  reviewed.   ED Course  Procedures (including critical care time) DIAGNOSTIC STUDIES: Oxygen Saturation is 97% on RA, normal by my interpretation.    COORDINATION OF CARE: 5:00 PM Discussed treatment plan with patient at beside, the patient agrees with the plan and has no further questions at this time.  Labs Review Labs Reviewed - No data to display  Imaging Review No results found.   EKG Interpretation None      Filed Vitals:   01/12/15 1307 01/12/15 1558 01/12/15 1809  BP: 136/112 150/114 142/100  Pulse: 132 134 121  Temp: 97.9 F (36.6 C) 98.1 F (36.7 C)   TempSrc: Oral    Resp: 18 18 20   Height: 5\' 8"  (1.727 m)    Weight: 147 lb (66.679 kg)    SpO2: 97% 97% 99%   MDM   Final diagnoses:  None   Patient with a history of Anxiety presents today requesting refill of his Klonipin.  He reports that he has been on this medication for years.  His Psychiatrist is currently on Maternity leave.  Patient denies SI or HI.  Patient given Rx for six Klonipin and instructed to follow up with his PCP.  Patient denies physical complaints aside from nausea.  He is tolerating PO liquids.  He is tachycardic, but appears anxious.  Review of the chart shows that the patient has been tachycardic at previous visits.  Patient discussed with Dr. Fredderick PhenixBelfi who is in agreement with the plan.    Santiago GladHeather Nneka Blanda, PA-C 01/14/15 2048  Santiago GladHeather Aman Bonet, PA-C 01/14/15 [id number]  Rolan BuccoMelanie Belfi, MD 01/19/15 [phone]

## 2015-01-16 ENCOUNTER — Emergency Department (HOSPITAL_COMMUNITY)
Admission: EM | Admit: 2015-01-16 | Discharge: 2015-01-18 | Disposition: A | Discharge status: Home / Self Care | Payer: Medicaid Other

## 2015-01-16 ENCOUNTER — Encounter (HOSPITAL_COMMUNITY): Payer: Self-pay

## 2015-01-16 DIAGNOSIS — F4321 Adjustment disorder with depressed mood: Secondary | ICD-10-CM | POA: Diagnosis present

## 2015-01-16 DIAGNOSIS — R000 Tachycardia, unspecified: Secondary | ICD-10-CM | POA: Insufficient documentation

## 2015-01-16 DIAGNOSIS — Z9119 Patient's noncompliance with other medical treatment and regimen: Secondary | ICD-10-CM | POA: Insufficient documentation

## 2015-01-16 DIAGNOSIS — R4182 Altered mental status, unspecified: Secondary | ICD-10-CM | POA: Insufficient documentation

## 2015-01-16 DIAGNOSIS — Z9114 Patient's other noncompliance with medication regimen: Secondary | ICD-10-CM

## 2015-01-16 DIAGNOSIS — Z87448 Personal history of other diseases of urinary system: Secondary | ICD-10-CM | POA: Insufficient documentation

## 2015-01-16 DIAGNOSIS — Z79899 Other long term (current) drug therapy: Secondary | ICD-10-CM | POA: Insufficient documentation

## 2015-01-16 DIAGNOSIS — I10 Essential (primary) hypertension: Secondary | ICD-10-CM | POA: Insufficient documentation

## 2015-01-16 HISTORY — DX: Personal history of suicidal behavior: Z91.51

## 2015-01-16 HISTORY — DX: Personal history of self-harm: Z91.5

## 2015-01-16 LAB — CBC WITH DIFFERENTIAL/PLATELET
BASOS PCT: 0 % (ref 0–1)
Basophils Absolute: 0 10*3/uL (ref 0.0–0.1)
EOS ABS: 0 10*3/uL (ref 0.0–0.7)
Eosinophils Relative: 0 % (ref 0–5)
HCT: 45 % (ref 39.0–52.0)
Hemoglobin: 15.8 g/dL (ref 13.0–17.0)
LYMPHS ABS: 1.2 10*3/uL (ref 0.7–4.0)
LYMPHS PCT: 10 % — AB (ref 12–46)
MCH: 31.2 pg (ref 26.0–34.0)
MCHC: 35.1 g/dL (ref 30.0–36.0)
MCV: 88.8 fL (ref 78.0–100.0)
MONO ABS: 0.8 10*3/uL (ref 0.1–1.0)
Monocytes Relative: 7 % (ref 3–12)
Neutro Abs: 10.2 10*3/uL — ABNORMAL HIGH (ref 1.7–7.7)
Neutrophils Relative %: 83 % — ABNORMAL HIGH (ref 43–77)
PLATELETS: 258 10*3/uL (ref 150–400)
RBC: 5.07 MIL/uL (ref 4.22–5.81)
RDW: 12.9 % (ref 11.5–15.5)
WBC: 12.2 10*3/uL — ABNORMAL HIGH (ref 4.0–10.5)

## 2015-01-16 LAB — COMPREHENSIVE METABOLIC PANEL
ALT: 20 U/L (ref 0–53)
AST: 23 U/L (ref 0–37)
Albumin: 4.5 g/dL (ref 3.5–5.2)
Alkaline Phosphatase: 92 U/L (ref 39–117)
Anion gap: 14 (ref 5–15)
BUN: 27 mg/dL — ABNORMAL HIGH (ref 6–23)
CO2: 22 mmol/L (ref 19–32)
Calcium: 9.2 mg/dL (ref 8.4–10.5)
Chloride: 102 mmol/L (ref 96–112)
Creatinine, Ser: 1.14 mg/dL (ref 0.50–1.35)
GFR calc Af Amer: 84 mL/min — ABNORMAL LOW (ref >90)
GFR calc non Af Amer: 72 mL/min — ABNORMAL LOW (ref >90)
Glucose, Bld: 116 mg/dL — ABNORMAL HIGH (ref 70–99)
Potassium: 3.3 mmol/L — ABNORMAL LOW (ref 3.5–5.1)
Sodium: 138 mmol/L (ref 135–145)
Total Bilirubin: 1.2 mg/dL (ref 0.3–1.2)
Total Protein: 7.6 g/dL (ref 6.0–8.3)

## 2015-01-16 LAB — ETHANOL: Alcohol, Ethyl (B): <5 mg/dL (ref 0–9)

## 2015-01-16 MED ORDER — PROPRANOLOL HCL 10 MG PO TABS
10.0000 mg | ORAL_TABLET | Freq: Two times a day (BID) | ORAL | Status: DC
  Administered 2015-01-16 – 2015-01-18 (×4): 10 mg via ORAL
  Filled 2015-01-16 (×6): qty 1

## 2015-01-16 MED ORDER — TAMSULOSIN HCL 0.4 MG PO CAPS
0.4000 mg | ORAL_CAPSULE | Freq: Every day | ORAL | Status: DC
  Administered 2015-01-17 – 2015-01-18 (×2): 0.4 mg via ORAL
  Filled 2015-01-16 (×2): qty 1

## 2015-01-16 MED ORDER — NICOTINE 21 MG/24HR TD PT24: 21.0000 mg | MEDICATED_PATCH | Freq: Every day | TRANSDERMAL | Status: DC | PRN

## 2015-01-16 MED ORDER — IBUPROFEN 400 MG PO TABS: 600.0000 mg | ORAL_TABLET | Freq: Three times a day (TID) | ORAL | Status: DC | PRN

## 2015-01-16 MED ORDER — LISINOPRIL 20 MG PO TABS
20.0000 mg | ORAL_TABLET | Freq: Every day | ORAL | Status: DC
  Administered 2015-01-16 – 2015-01-18 (×3): 20 mg via ORAL
  Filled 2015-01-16 (×3): qty 1

## 2015-01-16 MED ORDER — ALUM & MAG HYDROXIDE-SIMETH 200-200-20 MG/5ML PO SUSP: 30.0000 mL | ORAL | Status: DC | PRN

## 2015-01-16 MED ORDER — LORAZEPAM 1 MG PO TABS
1.0000 mg | ORAL_TABLET | Freq: Three times a day (TID) | ORAL | Status: DC | PRN
  Administered 2015-01-17 – 2015-01-18 (×3): 1 mg via ORAL
  Filled 2015-01-16 (×3): qty 1

## 2015-01-16 MED ORDER — ZOLPIDEM TARTRATE 5 MG PO TABS: 5.0000 mg | ORAL_TABLET | Freq: Every evening | ORAL | Status: DC | PRN

## 2015-01-16 MED ORDER — ACETAMINOPHEN 325 MG PO TABS: 650.0000 mg | ORAL_TABLET | ORAL | Status: DC | PRN

## 2015-01-16 MED ORDER — ONDANSETRON HCL 4 MG PO TABS: 4.0000 mg | ORAL_TABLET | Freq: Three times a day (TID) | ORAL | Status: DC | PRN

## 2015-01-16 NOTE — ED Notes (Signed)
Pt states he is seeing 3 people that have been downtown with him. Having audible and visual hallucinations. Denies them telling him to hurt himself or anyone else.
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
## 2015-01-16 NOTE — ED Notes (Signed)
Pt placed in blue scrubs, belongings in bags, keys given to friend to take home.

## 2015-01-16 NOTE — ED Notes (Addendum)
Pt reports being out of clonopin for 3 days for anxiety and sleep.  Pt reports that he has not slept in three days because of this.  Pt reported bilateral leg numbness in waiting room and no longer has numbness.  Pt disoriented to time.  Pt also reports hallucinations and friend/caregiver reports that this is just sleep deprivation and that "he's not acting right." Pt denies si/hi.

## 2015-01-17 ENCOUNTER — Encounter (HOSPITAL_COMMUNITY): Payer: Self-pay | Admitting: *Deleted

## 2015-01-17 LAB — RAPID URINE DRUG SCREEN, HOSP PERFORMED
Amphetamines: NOT DETECTED
Barbiturates: NOT DETECTED
Benzodiazepines: NOT DETECTED
Cocaine: NOT DETECTED
Opiates: NOT DETECTED
Tetrahydrocannabinol: POSITIVE — AB

## 2015-01-17 NOTE — ED Notes (Signed)
[facility] Irving BurtonEmily, [facility] to perform [facility]. [facility] need and will have performed when a provider arrives.

## 2015-01-17 NOTE — Progress Notes (Signed)
CSW following up with TTS to assist with disposition.  Referrals faxed to the following that have available beds:  [facility] per [facility] per [facility] per Jessica  Facilities that were at capacity:  [facility] per [facility] [place] per Jess [facility] per [facility] per Crystal  

## 2015-01-17 NOTE — ED Notes (Signed)
Pt's friend called and advised Taryn, RN, she was not able to locate pt's rx for Klonopin. States she found his d/c paperwork only. She was questioning again if pt will be given rx for Klonopin from ED d/t unable to obtain from his dr.

## 2015-01-17 NOTE — BHH Counselor (Addendum)
Binnie RailJoann Russo, [facility], confirms adult unit is at capacity. Contacted the following facilities for placement:  INFORMATION ALREADY FAXED, PT IS UNDER REVIEW: [facility]  AT CAPACITY: [facility], [facility], per [facility], per [facility], per [facility], per [facility], per [facility], per [facility], per [facility], per [facility], per [facility] [place], per Leticia PennaLarry [facility]nn [facility], per [facility], per [facility], per [facility], per [facility]  NO RESPONSE: [facility]  PT DECLINED: [facility]   Bryley Chrisman Avis EpleyEllis Katsumi Wisler Jr, LPC, [facility] Triage Specialist [phone]

## 2015-01-17 NOTE — BH Assessment (Addendum)
[facility] Assessment Progress Note   Update:  The following facilities were contacted in an attempt to place the pt by this clinician:  Referral Faxed for [facility] [place]-beds per [facility]-beds per Danny [facility]-No answer at 0919, but referral faxed for possible wait list [facility]-fax referral per Mary [facility]-accepting referrals per Jamilla [facility]-beds per [facility]-possible bed per Tom [facility]-Message stated to fax referral [facility]-No answer at 0943, but referral faxed for review [facility]-beds per Evelyn Brynn Marr-beds per Phoebe [facility]-beds per Shatavia Rutherford-beds per Cindy [facility]-beds per Allison [place]-beds per Kim  At Capacity [facility], per Kayla at 0933 [facility] per Robin at 0938 [facility] per Diane at 0939 [facility] per Jerome at 0945 [facility] per Larissa at 0946 [facility] per CJ at 0949 [facility] at 1120  No Answer/Left Message [facility]   TTS will continue to seek placement for the pt.  Kristen Rini Moffit, MS, LPC Licensed Professional Counselor Therapeutic Triage Specialist [facility] Phone: [phone] Fax: [phone]    

## 2015-01-17 NOTE — ED Notes (Signed)
SOCIAL WORK AT BEDSIDE TALKING TO PT AND HIS VISITOR

## 2015-01-17 NOTE — BH Assessment (Signed)
[facility] Assessment Progress Note   Received [facility] from [facility] [id number] stating pt declined there due to no programming there for Asperger's or any developmental disabilities.  Zachary Russo, [facility]

## 2015-01-17 NOTE — ED Notes (Signed)
Pt up to shower with sitter at this time.
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
## 2015-01-17 NOTE — ED Provider Notes (Signed)
Pt smiling, alert, content.   Filed Vitals:   01/17/15 0918  BP: 139/101  Pulse: 103  Temp:   Resp: 18   Disposition per psych team remains pending.   Medication management per psych team.     Suzi RootsKevin E Aliviyah Malanga, MD 01/17/15 [phone]

## 2015-01-17 NOTE — ED Notes (Signed)
Pt states he cannot sleep and would like something to help him get sleep.

## 2015-01-17 NOTE — ED Notes (Addendum)
JANE, STAFFING, & Traci, Consulting civil engineerCharge RN, AWARE PT IS SI - PLAN IS TO OD PER COUNSELOR NOTE.
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
## 2015-01-18 DIAGNOSIS — F4321 Adjustment disorder with depressed mood: Secondary | ICD-10-CM | POA: Diagnosis present

## 2015-01-18 DIAGNOSIS — F320 Major depressive disorder, single episode, mild: Secondary | ICD-10-CM

## 2015-01-18 MED ORDER — LISINOPRIL 20 MG PO TABS: 20.0000 mg | ORAL_TABLET | Freq: Every day | ORAL | Status: DC

## 2015-01-18 MED ORDER — PROPRANOLOL HCL 10 MG PO TABS: 10.0000 mg | ORAL_TABLET | Freq: Two times a day (BID) | ORAL | Status: DC

## 2015-01-18 NOTE — ED Notes (Signed)
Dr Bebe ShaggyWickline in w/pt and friend, Hale BogusMara.

## 2015-01-18 NOTE — ED Notes (Signed)
Inetta Fermoina, AC, [facility], AWARE [facility] TO HOME.

## 2015-01-18 NOTE — ED Provider Notes (Signed)
Pt has been seen by psych and cleared for d/c home He is awake/alert, no distress He answers questions appropriately Will refill lisinopril and inderal He will f/u with [facility] for his klonopin BP 147/103 mmHg  Pulse 97  Temp(Src) 97.8 F (36.6 C) (Oral)  Resp 20  SpO2 100%   Joya Gaskinsonald W Jacobs Golab, MD 01/18/15 1913

## 2015-01-18 NOTE — ED Notes (Signed)
Julieanne Cottonina, AC, BHH, advised Telepsych will be performed around 1730.

## 2015-01-18 NOTE — ED Notes (Signed)
DR Bebe ShaggyWICKLINE AWARE PER Nanine MeansJAMISON LORD, NP, PT MAY BE D/C'D TO HOME. PT CONTINUES TO DENY SI/HI OR HALLUCINATIONS.

## 2015-01-18 NOTE — Discharge Instructions (Signed)
°Emergency Department Resource Guide °1) Find a Doctor and Pay Out of Pocket °Although you won't have to find out who is covered by your insurance plan, it is a good idea to ask around and get recommendations. You will then need to call the office and see if the doctor you have chosen will accept you as a new patient and what types of options they offer for patients who are self-pay. Some doctors offer discounts or will set up payment plans for their patients who do not have insurance, but you will need to ask so you aren't surprised when you get to your appointment. ° °2) Contact Your Local Health Department °Not all health departments have doctors that can see patients for sick visits, but many do, so it is worth a call to see if yours does. If you don't know where your local health department is, you can check in your phone book. The CDC also has a tool to help you locate your state's health department, and many state websites also have listings of all of their local health departments. ° °3) Find a Walk-in Clinic °If your illness is not likely to be very severe or complicated, you may want to try a walk in clinic. These are popping up all over the country in pharmacies, drugstores, and shopping centers. They're usually staffed by nurse practitioners or physician assistants that have been trained to treat common illnesses and complaints. They're usually fairly quick and inexpensive. However, if you have serious medical issues or chronic medical problems, these are probably not your best option. ° °No Primary Care Doctor: °- Call [facility] at  832-8000 - they can help you locate a primary care doctor that  accepts your insurance, provides certain services, etc. °- Physician Referral Service- 1-800-533-3463 ° °Chronic Pain Problems: °Organization         Address  Phone   Notes  °[facility]  (336) 297-2271 Patients need to be referred by their primary care doctor.  ° °Medication  Assistance: °Organization         Address  Phone   Notes  °[place] [facility] [address] °[place], [place] [address] (336) 641-8030 --Must be a resident of [place] °-- Must have NO insurance coverage whatsoever (no Medicaid/ Medicare, etc.) °-- The pt. MUST have a primary care doctor that directs their care regularly and follows them in the community °  °[facility]  (866) 331-1348   °[employer]  (888) 892-1162   ° °Agencies that provide inexpensive medical care: °Organization         Address  Phone   Notes  °[facility]  (336) 832-8035   °[facility]    (336) 832-7272   °[facility] [address] °[place], [place] [address] (336) 832-4777   °[facility] [address], °[place] (336) 271-4999   °[facility]    (336) 373-0678   °[facility]    (336) 272-1050   °[facility] ° [address], [place] Phone:  (336) 832-4444, Fax:  (336) 832-4440 Hours of Operation:  9 am - 6 pm, M-F.  Also accepts Medicaid/Medicare and self-pay.  ° [facility] ° [address], [place] Phone: (336) 832-3150, Fax: (336) 832-3151. Hours of Operation:  8:30 am - 5:30 pm, M-F.  Also accepts Medicaid and self-pay.  °[facility] [place] [address], [place] Phone: (336) 878-6027   °[facility] [address], [place], [place] [place] (336)723-1848, Ext. 123 Mondays & Thursdays: 7-9 AM.  First 15 patients are seen on a first come, first serve basis. °  ° °Medicaid-accepting [place] Providers: ° °Organization         Address  Phone   Notes  °[facility] [address], [place] (336) 641-2100 Also accepts self-pay patients.  °[facility] [address], [place] ° (336) 856-9996   °[facility] [address], [place]  (336) 288-8857   °[facility] [address] [place] [address], [place] (336) 299-7000   °Veita Bland [address], [place]  ° (336) 373-1557 Only accepts [place] Access Medicaid patients after they have their name applied to their card.  ° °Self-Pay (no insurance) in [place]: ° °Organization         Address  Phone   Notes  °Sickle Cell Patients, [facility] [address], [place] (336) 832-1970   °[facility] [address], [place] (336) 832-4400   °[facility] ° [address], [place] (336) 992-4800   °[facility]/Dr. Osei-Bonsu ° [address] [place] [address], [place] or [address], [place] (336) 841-8500 Phone number for both [place] and [place] locations is the same.  °[facility] [address], [place] (336) 299-0000   °[facility] [address] [place] [address], [place] or [address] (336) 852-7530 °(336) 878-2260   °[facility] [address], [place] (336) 350-1642, phone; (336) 294-5005, fax Sees patients 1st and 3rd Saturday of every month.  Must not qualify for public or private insurance (i.e. Medicaid, Medicare, [place] Health Choice, Veterans' Benefits) • Household income should be no more than 200% of the poverty level •The clinic cannot treat you if you are pregnant or think you are pregnant • Sexually transmitted diseases are not treated at the clinic.  ° ° °Dental Care: °Organization         Address  Phone  Notes  °[place] [facility] [address], [place] (336) 641-6152 Accepts children up to age 21 who are enrolled in Medicaid or [place] Health Choice; pregnant women with a Medicaid card; and children who have applied for Medicaid or [place] Health Choice, but were declined, whose parents can pay a reduced fee at time of service.  °[place]  [facility] [place]  [address], [place] (336) 641-7733 Accepts children up to age 21 who are enrolled in Medicaid or [place] Health Choice; pregnant women with a Medicaid card; and children who have applied for Medicaid or  Health Choice, but were declined, whose parents can pay a reduced fee at time of service.  °[facility] ° [address],  (336) 641-4533 Patients are seen by appointment only. Walk-ins are not accepted. [facility] will see patients 18 years of age and older. °Monday - Tuesday (8am-5pm) °Most Wednesdays (8:30-5pm) °$30 per visit, cash only  °[facility] ° [address], [place] (336) 641-4533 Patients are seen by appointment only. Walk-ins are not accepted. [facility] will see patients 18 years of age and older. °One   Wednesday Evening (Monthly: Volunteer Based).  $30 per visit, cash only  °[facility]  (919) 537-3737 for adults; Children under age 4, call Graduate Pediatric Dentistry at (919) [phone]. Children aged 4-14, please call (919) [phone] to request a pediatric application. ° Dental services are provided in all areas of dental care including fillings, crowns and bridges, complete and partial dentures, implants, gum treatment, root canals, and extractions. Preventive care is also provided. Treatment is provided to both adults and children. °Patients are selected via a lottery and there is often a waiting list. °  °[facility] [address], °[place] ° (336) 763-8833 [email or web address] °  °[facility] [address], [place], [place] (336)723-1848, Ext. 123 Second and Fourth Thursday of each month, opens at 6:30 AM; Clinic ends at 9 AM.  Patients are seen on a first-come first-served basis, and a limited number are seen during each clinic.  ° °[facility] ° [address], [place], [place] (336) 723-7904    Eligibility Requirements °You must have lived in [place], [place], or [place] counties for at least the last three months. °  You cannot be eligible for state or federal sponsored healthcare insurance, including Veterans Administration, Medicaid, or Medicare. °  You generally cannot be eligible for healthcare insurance through your employer.  °  How to apply: °Eligibility screenings are held every Tuesday and Wednesday afternoon from 1:00 pm until 4:00 pm. You do not need an appointment for the interview!  °[facility] [address], [place], [place] 336-631-2330   °[place] [facility]  336-342-8273   °[place]  336-703-3100   °[facility]  336-570-6415   ° °Behavioral Health Resources in the Community: °Intensive Outpatient Programs °Organization         Address  Phone  Notes  °[facility] [address], [place], [place] 336-878-6098   °[facility] [address], [place], [place] 336-832-9800   °[facility] [address], [place], [place] ° 336-882-2125   °[facility] [address],  °[place], [place] 1-800-853-5163 or 336-641-4981   °Substance Abuse Resources °Organization         Address  Phone  Notes  °[facility]  336-882-2125   °[facility]  336-784-9470   °[facility]  336-285-9073   °[facility]  336-845-3988   °Residential & Outpatient Substance Abuse Program  1-800-659-3381   °Psychological Services °Organization         Address  Phone  Notes  °[facility]  336- 832-9600   °[facility]  336- 378-7881   °[facility] [address], [place] 1-800-853-5163 or 336-641-4981   ° °Mobile Crisis Teams °Organization         Address  Phone  Notes  °[facility], Mobile Crisis Care Unit  1-877-626-1772   °Assertive °[facility] ° [address]  [place], [place] 336-834-9664   °Sharon DeEsch [address] °[place] 336-554-5454   ° °Self-Help/Support Groups °Organization         Address  Phone             Notes  °[facility] - variety of support groups  336- 373-1402 Call for more information  °Narcotics Anonymous (NA), [facility] [address], °[place]   2 meetings at this location  ° °  Residential Treatment Programs °Organization         Address  Phone  Notes  °[facility] [address],    °[place]  1-866-801-8205   °[facility] ° [address], [place], [place] 704-293-8524   °[facility] [address], [place] 336-845-3988 Admissions: 8am-3pm M-F  °[facility] [address]    °[place], [place] 336-841-1104   °[facility] [address], [place], [place] 336-379-7146   °[facility] [address]  °[place], [place] 336-285-9073   °[facility] - Intensive Outpatient [address], [place], [place] 336-852-3033   °[facility] [address]  °[place], [place] 1-877-615-2722 or 336-784-9470   °[facility] [address] , [place] 336-227-7417 Accepts Medicaid  °[facility] [address]  °[place] 1-800-659-3381 Substance Abuse/Addiction Treatment  ° °[place] Behavioral Health Resources °Organization         Address  Phone  Notes  °[facility]  (888) 581-9988   °Julie Brannon, PhD [address] [place], [place]   (336) 349-5553 or (336) 951-0000   °[facility]   [address] °[place], [place] (336) 349-4454   °[facility] [address], [place], [place] (336) 342-8316 Insurance/Medicaid/sponsorship through [facility]  °[facility] [address]                                    [place], [place] (336) 342-8316 Therapy/tele-psych/case    °[facility] [address]  ° [place], [place] (336) 349-2233    °Dr. Arfeen  (336) 349-4544   °[facility] [place]  [employer] [place] [facility] 1) [address], [place] °2) [address], [place] °3)  [address], [place] (336) 349-3220 °(336) 342-7768 ° °(336) 342-8140   °[place] [facility] (336) 342-1394 or (336) 342-3537 (After Hours)    ° ° °

## 2015-01-18 NOTE — ED Notes (Signed)
Pt out at desk with concerns that he "is one of the people being heavily guarded" in ED. Was insistent that RN knew that he was in facility voluntarily. States heneeds to be here. Denies suicidal ideation at this time. When asked about AVH, states, "after yesterday, I'm not thinking straight."

## 2015-01-18 NOTE — Consult Note (Signed)
[facility]   Reason for Consult:  Depression  Referring Physician:  EDP Patient Identification: Zachary Russo MRN:  [id number] Principal Diagnosis: <principal problem not specified> Diagnosis:   Patient Active Problem List   Diagnosis Date Noted  . Acute encephalopathy [G93.40] 12/18/2013  . Delirium [R41.0] 12/18/2013  . 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 Abnormal brain CT [R93.0] 12/18/2013    Total Time spent with patient: 45 minutes  Subjective:   Zachary Russo is a 54 y.o. male patient does not warrant admission.  HPI:  The patient came to the hospital with dehydration.  He had been on Klonopin and was taking Vistaril in its place when he started noticing how dry his mouth was.  Denies suicidal/homicidal ideations, hallucinations, and alcohol/drug abuse.  Past attempt may years ago but does not feel like that now.  "There is hope now."  He lives alone and has had some depression because he sent out 150 job applications, only got one call back.  He feels he is very qualified to perform all the jobs.  His best friend, [name], was called with his permission for collateral information.  Most of his family is deceased (mother and grandmother).  She stated he was sleep deprived this week but has gotten some sleep and feels he is safe to go home.  He was also hydrated with fluids.  [name] states he has an Zachary Russo phone that frequently runs out of minutes but she is going to get more added and put the crisis number to text (if minutes are out), if needed.  She does not feel he is a threat to himself.  [facility] and will return there for his follow-up.   HPI Elements:   Location:  generalized. Quality:  acute. Severity:  mild. Timing:  intermittent. Duration:  few weeks. Context:  stressors.  Past Medical History:  Past Medical History  Diagnosis Date  . Hypertension   . Prostate disorder   . Anxiety   . Asperger syndrome   . H/O  suicide attempt     overdose "many years ago"   History reviewed. No pertinent past surgical history. Family History: No family history on file. Social History:  History  Alcohol Use No     History  Drug Use  . Yes  . Special: Marijuana    History   Social History  . Marital Status: Single    Spouse Name: N/A    Number of Children: N/A  . Years of Education: N/A   Social History Main Topics  . Smoking status: Never Smoker   . Smokeless tobacco: Never Used  . Alcohol Use: No  . Drug Use: Yes    Special: Marijuana  . Sexual Activity: Not Currently   Other Topics Concern  . None   Social History Narrative   Additional Social History:    Prescriptions: See PTA list History of alcohol / drug use?: Yes Longest period of sobriety (when/how long): pt is uncertain Negative Consequences of Use:  (pt couldn't state any)                     Allergies:   Allergies  Allergen Reactions  . Cephalexin Nausea And Vomiting  . Codeine Itching    Vitals: Blood pressure 143/106, pulse 95, temperature 98.5 F (36.9 C), temperature source Oral, resp. rate 18, SpO2 98 %.  Risk to Self: Suicidal Ideation: No-Not Currently/Within Last 6 Months (denies) Suicidal Intent:  No-Not Currently/Within Last 6 Months (denies) Is patient at risk for suicide?: Yes (possibly) Suicidal Plan?: No-Not Currently/Within Last 6 Months (plan to OD) Access to Means: Yes Specify Access to Suicidal Means: prescription medications What has been your use of drugs/alcohol within the last 12 months?: occasional use How many times?: 1 Other Self Harm Risks: no (denies) Triggers for Past Attempts: Other (Comment) (became overwhelmed taking care of his ailing mother per pt) Intentional Self Injurious Behavior: None (denies) Risk to Others: Homicidal Ideation: No (denies) Thoughts of Harm to Others: No (denies) Current Homicidal Intent: No (denies) Current Homicidal Plan: No (denies) Access to  Homicidal Means: No (denies access to firearms) Identified Victim: na History of harm to others?: No (none noted) Assessment of Violence: None Noted Violent Behavior Description: na Does patient have access to weapons?: No Criminal Charges Pending?: No Does patient have a court date: No Prior Inpatient Therapy: Prior Inpatient Therapy:  (unknown) Prior Therapy Dates: na Prior Therapy Facilty/Provider(s): na Reason for Treatment: na Prior Outpatient Therapy: Prior Outpatient Therapy:  (unknown per pt) Prior Therapy Dates: na Prior Therapy Facilty/Provider(s): na Reason for Treatment: na  Current Facility-Administered Medications  Medication Dose Route Frequency Provider Last Rate Last Dose  . acetaminophen (TYLENOL) tablet 650 mg  650 mg Oral Q4H PRN Flint Melter, MD      . alum & mag hydroxide-simeth (MAALOX/MYLANTA) 200-200-20 MG/5ML suspension 30 mL  30 mL Oral PRN Flint Melter, MD      . ibuprofen (ADVIL,MOTRIN) tablet 600 mg  600 mg Oral Q8H PRN Flint Melter, MD      . lisinopril (PRINIVIL,ZESTRIL) tablet 20 mg  20 mg Oral Daily Flint Melter, MD   20 mg at 01/18/15 1032  . LORazepam (ATIVAN) tablet 1 mg  1 mg Oral TID PRN Flint Melter, MD   1 mg at 01/18/15 1032  . nicotine (NICODERM CQ - dosed in mg/24 hours) patch 21 mg  21 mg Transdermal Daily PRN Flint Melter, MD      . ondansetron [facility]) tablet 4 mg  4 mg Oral Q8H PRN Flint Melter, MD      . propranolol (INDERAL) tablet 10 mg  10 mg Oral BID Flint Melter, MD   10 mg at 01/18/15 1032  . tamsulosin (FLOMAX) capsule 0.4 mg  0.4 mg Oral QPC supper Flint Melter, MD   0.4 mg at 01/17/15 1806  . zolpidem (AMBIEN) tablet 5 mg  5 mg Oral QHS PRN Flint Melter, MD       Current Outpatient Prescriptions  Medication Sig Dispense Refill  . clonazePAM (KLONOPIN) 0.5 MG tablet Take 2 tablets (1 mg total) by mouth at bedtime. 6 tablet 0  . lisinopril (PRINIVIL,ZESTRIL) 20 MG tablet Take 1 tablet (20 mg total)  by mouth daily.    Marland Kitchen LORazepam (ATIVAN) 1 MG tablet Take 1 tablet (1 mg total) by mouth 3 (three) times daily as needed for anxiety. 15 tablet 0  . propranolol (INDERAL) 10 MG tablet Take 1 tablet (10 mg total) by mouth 2 (two) times daily. 60 tablet 0  . tamsulosin (FLOMAX) 0.4 MG CAPS capsule Take 1 capsule (0.4 mg total) by mouth daily after supper. 30 capsule 0  . ondansetron (ZOFRAN) 4 MG tablet Take 1 tablet (4 mg total) by mouth every 6 (six) hours. (Patient not taking: Reported on 01/16/2015) 12 tablet 0    Musculoskeletal: Strength & Muscle Tone: within normal limits Gait & Station:  normal Patient leans: N/A  Psychiatric Specialty Exam:     Blood pressure 143/106, pulse 95, temperature 98.5 F (36.9 C), temperature source Oral, resp. rate 18, SpO2 98 %.There is no weight on file to calculate BMI.  General Appearance: Casual  Eye Contact::  Good  Speech:  Normal Rate  Volume:  Normal  Mood:  Depressed, mild  Affect:  Appropriate  Thought Process:  Coherent  Orientation:  Full (Time, Place, and Person)  Thought Content:  WDL  Suicidal Thoughts:  No  Homicidal Thoughts:  No  Memory:  Immediate;   Good Recent;   Good Remote;   Good  Judgement:  Fair  Insight:  Good  Psychomotor Activity:  Normal  Concentration:  Good  Recall:  Good  Fund of Knowledge:Good  Language: Good  Akathisia:  No  Handed:  Right  AIMS (if indicated):     Assets:  Communication Skills Desire for Improvement Financial Resources/Insurance Housing Leisure Time Physical Health Resilience Social Support  ADL's:  Intact  Cognition: WNL  Sleep:      Medical Decision Making: Established Problem, Stable/Improving (1), Review of Psycho-Social Stressors (1) and Review of Medication Regimen & Side Effects (2)  Treatment Plan Summary: Daily contact with patient to assess and evaluate symptoms and progress in treatment, Medication management and Plan Discharge home to his friend, follow-up with his  regular providers at [facility]  Plan:  No evidence of imminent risk to self or others at present.   Disposition: Discharge from psychiatric point home to his friend, Ms. Zachary Conger [phone]), based on medical clearance per EDP.  Follow-up with his regular providers at [facility].  Nanine Means, PMH-NP 01/18/2015 6:14 PM Patient case discussed with NP and patient seen in rounds, as above

## 2015-01-27 ENCOUNTER — Encounter (HOSPITAL_COMMUNITY): Payer: Self-pay

## 2015-01-27 ENCOUNTER — Emergency Department (HOSPITAL_COMMUNITY)
Admission: EM | Admit: 2015-01-27 | Discharge: 2015-01-27 | Disposition: A | Discharge status: Home / Self Care | Payer: Medicaid Other | Attending: Emergency Medicine | Admitting: Emergency Medicine

## 2015-01-27 DIAGNOSIS — Z720 Tobacco use: Secondary | ICD-10-CM | POA: Insufficient documentation

## 2015-01-27 DIAGNOSIS — I10 Essential (primary) hypertension: Secondary | ICD-10-CM | POA: Insufficient documentation

## 2015-01-27 DIAGNOSIS — Z880 Allergy status to penicillin: Secondary | ICD-10-CM | POA: Insufficient documentation

## 2015-01-27 DIAGNOSIS — F419 Anxiety disorder, unspecified: Secondary | ICD-10-CM | POA: Insufficient documentation

## 2015-01-27 DIAGNOSIS — Z87438 Personal history of other diseases of male genital organs: Secondary | ICD-10-CM | POA: Insufficient documentation

## 2015-01-27 DIAGNOSIS — F1210 Cannabis abuse, uncomplicated: Secondary | ICD-10-CM | POA: Insufficient documentation

## 2015-01-27 DIAGNOSIS — F329 Major depressive disorder, single episode, unspecified: Secondary | ICD-10-CM | POA: Diagnosis present

## 2015-01-27 DIAGNOSIS — Z760 Encounter for issue of repeat prescription: Secondary | ICD-10-CM | POA: Insufficient documentation

## 2015-01-27 DIAGNOSIS — Z79899 Other long term (current) drug therapy: Secondary | ICD-10-CM | POA: Insufficient documentation

## 2015-01-27 LAB — CBC WITH DIFFERENTIAL/PLATELET
Basophils Absolute: 0 10*3/uL (ref 0.0–0.1)
Basophils Relative: 0 % (ref 0–1)
Eosinophils Absolute: 0.1 10*3/uL (ref 0.0–0.7)
Eosinophils Relative: 1 % (ref 0–5)
HCT: 43 % (ref 39.0–52.0)
Hemoglobin: 14.5 g/dL (ref 13.0–17.0)
Lymphocytes Relative: 24 % (ref 12–46)
Lymphs Abs: 2.4 10*3/uL (ref 0.7–4.0)
MCH: 30.7 pg (ref 26.0–34.0)
MCHC: 33.7 g/dL (ref 30.0–36.0)
MCV: 91.1 fL (ref 78.0–100.0)
Monocytes Absolute: 0.8 10*3/uL (ref 0.1–1.0)
Monocytes Relative: 8 % (ref 3–12)
Neutro Abs: 7 10*3/uL (ref 1.7–7.7)
Neutrophils Relative %: 67 % (ref 43–77)
Platelets: 284 10*3/uL (ref 150–400)
RBC: 4.72 MIL/uL (ref 4.22–5.81)
RDW: 12.9 % (ref 11.5–15.5)
WBC: 10.3 10*3/uL (ref 4.0–10.5)

## 2015-01-27 LAB — RAPID URINE DRUG SCREEN, HOSP PERFORMED
Amphetamines: NOT DETECTED
Barbiturates: NOT DETECTED
Benzodiazepines: NOT DETECTED
Cocaine: NOT DETECTED
Opiates: NOT DETECTED
Tetrahydrocannabinol: POSITIVE — AB

## 2015-01-27 LAB — BASIC METABOLIC PANEL
Anion gap: 8 (ref 5–15)
BUN: 12 mg/dL (ref 6–23)
CO2: 25 mmol/L (ref 19–32)
Calcium: 9.1 mg/dL (ref 8.4–10.5)
Chloride: 105 mmol/L (ref 96–112)
Creatinine, Ser: 0.88 mg/dL (ref 0.50–1.35)
GFR calc Af Amer: >90 mL/min (ref >90)
GFR calc non Af Amer: >90 mL/min (ref >90)
Glucose, Bld: 107 mg/dL — ABNORMAL HIGH (ref 70–99)
Potassium: 3.5 mmol/L (ref 3.5–5.1)
Sodium: 138 mmol/L (ref 135–145)

## 2015-01-27 MED ORDER — CLONAZEPAM 1 MG PO TABS
1.0000 mg | ORAL_TABLET | Freq: Every day | ORAL | Status: DC
  Filled 2015-01-27: qty 2

## 2015-01-27 MED ORDER — LORAZEPAM 1 MG PO TABS: 1.0000 mg | ORAL_TABLET | Freq: Three times a day (TID) | ORAL | Status: DC | PRN

## 2015-01-27 MED ORDER — ONDANSETRON HCL 4 MG PO TABS: 4.0000 mg | ORAL_TABLET | Freq: Three times a day (TID) | ORAL | Status: DC | PRN

## 2015-01-27 MED ORDER — LISINOPRIL 20 MG PO TABS
20.0000 mg | ORAL_TABLET | Freq: Every day | ORAL | Status: DC
  Administered 2015-01-27: 20 mg via ORAL
  Filled 2015-01-27: qty 1

## 2015-01-27 MED ORDER — HYDROXYZINE HCL 25 MG PO TABS
50.0000 mg | ORAL_TABLET | Freq: Once | ORAL | Status: AC
  Administered 2015-01-27: 50 mg via ORAL
  Filled 2015-01-27: qty 2

## 2015-01-27 MED ORDER — TAMSULOSIN HCL 0.4 MG PO CAPS
0.4000 mg | ORAL_CAPSULE | Freq: Every day | ORAL | Status: DC
  Filled 2015-01-27 (×2): qty 1

## 2015-01-27 MED ORDER — PROPRANOLOL HCL 10 MG PO TABS
10.0000 mg | ORAL_TABLET | Freq: Two times a day (BID) | ORAL | Status: DC
  Administered 2015-01-27: 10 mg via ORAL
  Filled 2015-01-27 (×2): qty 1

## 2015-01-27 MED ORDER — FLUOXETINE HCL 20 MG PO CAPS
60.0000 mg | ORAL_CAPSULE | Freq: Every day | ORAL | Status: DC
  Administered 2015-01-27: 60 mg via ORAL
  Filled 2015-01-27: qty 3

## 2015-01-27 NOTE — BH Assessment (Addendum)
Tele Assessment Note   Zachary Russo is an 54 y.o. male presenting to ED stating he needed medication refill. Pt presented to ED wearing scrubs, and bring his hygiene items so he could be "prepared." Pt is alert and oriented times 4. Speech is at a rapid pace with bizarre phrasing and repetitive word choices. Pt struggles to answer most questions directly. He reports immerging SI with thoughts to overdose if he does not get some sleep. Pt reports he has not slept since Thursday, is not eating, or caring for himself. He reports he leaves for "in a constant state of preparedness but I am unable to process it." He reports bugs in his food and refrigerator. Pt reports his major stressors include not being able to sleep, needing to stay away and work but being unable to complete his work. 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 Pt then relays he cannot sleep until he finds out to keep a perpetual A that happens when he is not around. Pt denies current AVH but reports he had them last ED visit and is concerned it will return. "I am a normal well adjusted guy who happens to be a homosexual, but that is okay because I don't need to process sexual thoughts. " Pt reports he wants to have a SO relationship with someone he knows, but feels this person needs to have sex with people he does not know, and has child from his desire to have sex with inappropriate people. Pt repeatedly stated he needed someone to take care of him, when he fails to do so, and was upset that the man he wanted could not be a provider for him.   Pt reports he lives alone, "which causes stress, because I am unable to find someone to take care of me." Pt kept stating that he could not express, or communicate, non-verbally, musically, that he wanted to be able to make sounds and have people say "Oh  he's constipated."   Pt reports he has not been eating despite being in "emergency preparedness mode" Pt reports depressive sx related to "unexpressed verbal thoughts and expressions to communicate." Pt reports starting to develop SI due to inability to sleep. Reports he may have committed suicide in past when taking pills to sleep, not sure if he wanted to die or sleep per pt. Pt reports he feels sleeping or dying may be the only way to relieve stress. Pt reports he fears he may have PTSD from his mother dying 1.5 years ago. Pt reports he should have been able to deal with it given his age, but "it happened at the most unexpected time, when I was expecting it."  Pt reports he harms himself when he can not get up rapidly enough to void. "But is okay because I do not have solid waste because I do not process nutrition." Pt reports he panics when he feels like he can not sleep or he can not move fast enough. Pt reports he needs to be able to go "light speed" but is unable to do so.   Pt reports some legal problems related to driving without a license. He reports due to his lack of sleep he is unable to take care of these problems.   Pt reports he does not know family MH or SA hx.   Pt would likely  benefit from a social work consult and possibly and ACT referral. It is questionable whether pt can adequately care for himself.   Axis I: 300.00 Unspecified Anxiety Disorder  311 Unspecified Depressive Disorder  Rule out Delusional Disorder   Rule Out Insomnia   Axis II: Deferred Dependent Personality traits Axis III:  Past Medical History  Diagnosis Date  . Hypertension   . Prostate disorder   . Anxiety   . Asperger syndrome   . H/O suicide attempt     overdose "many years ago"   Axis IV: economic problems, occupational problems, other psychosocial or environmental problems, problems related to legal system/crime, problems with access to health care services and problems with primary support  group Axis V: 11-20 some danger of hurting self or others possible OR occasionally fails to maintain minimal personal hygiene OR gross impairment in communication  Past Medical History:  Past Medical History  Diagnosis Date  . Hypertension   . Prostate disorder   . Anxiety   . Asperger syndrome   . H/O suicide attempt     overdose "many years ago"    History reviewed. No pertinent past surgical history.  Family History: History reviewed. No pertinent family history.  Social History:  reports that he has been smoking.  He has never used smokeless tobacco. He reports that he uses illicit drugs (Marijuana). He reports that he does not drink alcohol.  Additional Social History:  Alcohol / Drug Use Pain Medications: SEE PTA Prescriptions: SEE PTA Over the Counter: SEE PTA History of alcohol / drug use?: No history of alcohol / drug abuse (denies use) Longest period of sobriety (when/how long): NA Withdrawal Symptoms:  (none reported)  CIWA: CIWA-Ar BP: (!) 148/101 mmHg Pulse Rate: 92 COWS:    PATIENT STRENGTHS: (choose at least two) Communication skills  Insight that treatment is needed  Allergies:  Allergies  Allergen Reactions  . Amoxicillin Other (See Comments)    Ineffective (patient states his is resistant)  . Cephalexin Nausea And Vomiting  . Codeine Itching    Home Medications:  (Not in a hospital admission)  OB/GYN Status:  No LMP for male patient.  General Assessment Data [facility] of [facility]: [facility] Is this a Tele or Face-to-Face Assessment?: Face-to-Face Is this an Initial Assessment or a Re-assessment for this encounter?: Initial Assessment Living Arrangements: Alone Can pt return to current living arrangement?: Yes Admission Status: Voluntary Is patient capable of signing voluntary admission?: Yes Transfer from: Home Referral Source:  (EMS)     [facility] Living Arrangements: Alone Name of Psychiatrist: Tanya NonesMichelle Baker per pt Name  of Therapist: Annamary RummageKaren Garrupi per pt  Education Status Is patient currently in school?: No Current Grade: NA Highest grade of school patient has completed: GED Name of school: NA Contact person: NA  Risk to self with the past 6 months Suicidal Ideation: Yes-Currently Present Suicidal Intent:  (immerging per pt) Is patient at risk for suicide?: Yes Suicidal Plan?: Yes-Currently Present Specify Current Suicidal Plan: reports plan to overdose if he can not get some sleep  Access to Means: Yes Specify Access to Suicidal Means: medicaiton What has been your use of drugs/alcohol within the last 12 months?: denies any use, previously reported occassional use Previous Attempts/Gestures: Yes How many times?: 1 Triggers for Past Attempts: Other (Comment) Intentional Self Injurious Behavior: None Family Suicide History: Unknown Recent stressful life event(s): Loss (Comment) (mother died 1.5 years ago) Persecutory voices/beliefs?: No Depression: Yes Depression Symptoms: Despondent, Insomnia, Tearfulness  Substance abuse history and/or treatment for substance abuse?: No Suicide prevention information given to non-admitted patients: Yes  Risk to Others within the past 6 months Homicidal Ideation: No Thoughts of Harm to Others: No Current Homicidal Intent: No Current Homicidal Plan: No Access to Homicidal Means: No Identified Victim: none History of harm to others?: No Assessment of Violence: None Noted Violent Behavior Description: none Does patient have access to weapons?: No Criminal Charges Pending?: No Does patient have a court date:  (possibly due to driving w/o a license)  Psychosis Hallucinations: Auditory (none currently reported had some a week ago) Delusions: Grandiose  Mental Status Report Appear/Hygiene: Disheveled (came wearing his own hospital scrubs) Eye Contact: Good Motor Activity: Unremarkable Speech: Word salad, Other (Comment) (does not answer questions asked  most of assessment) Level of Consciousness: Alert, Quiet/awake Mood: Anxious, Depressed Affect: Blunted, Sad, Preoccupied Anxiety Level: Moderate Thought Processes: Coherent, Tangential Judgement: Impaired Orientation: Person, Place, Time, Situation Obsessive Compulsive Thoughts/Behaviors: None  Cognitive Functioning Concentration: Decreased Memory: Recent Intact, Remote Intact IQ: Average Insight: Poor Impulse Control: Unable to Assess Appetite: Poor Weight Loss:  (unknown) Weight Gain: 0 Sleep: Decreased Total Hours of Sleep: 0 (no sleep since Thursday ) Vegetative Symptoms: Decreased grooming, Not bathing  ADLScreening [facility] Assessment Services) Patient's cognitive ability adequate to safely complete daily activities?: No Patient able to express need for assistance with ADLs?: No Independently performs ADLs?:  (does not appear to be caring for himself,reports he needs someone to take care of him when he fails to do so)  Prior Inpatient Therapy Prior Inpatient Therapy:  (unknown) Prior Therapy Dates: NA Prior Therapy Facilty/Provider(s): NA Reason for Treatment: NA  Prior Outpatient Therapy Prior Outpatient Therapy: Yes Prior Therapy Dates: reports no current providers Prior Therapy Facilty/Provider(s): unknown Reason for Treatment: depression, and anxiety per pt  ADL Screening (condition at time of admission) Patient's cognitive ability adequate to safely complete daily activities?: No Is the patient deaf or have difficulty hearing?: No Does the patient have difficulty concentrating, remembering, or making decisions?: Yes Patient able to express need for assistance with ADLs?: No Does the patient have difficulty dressing or bathing?: No Independently performs ADLs?:  (does not appear to be caring for himself,reports he needs someone to take care of him when he fails to do so) Does the patient have difficulty walking or climbing stairs?: No Weakness of Legs:  None Weakness of Arms/Hands: None  Home Assistive Devices/Equipment Home Assistive Devices/Equipment: None    Abuse/Neglect Assessment (Assessment to be complete while patient is alone) Physical Abuse: Denies Verbal Abuse: Denies Sexual Abuse: Denies Exploitation of patient/patient's resources: Denies Self-Neglect: Denies, provider concerned (Comment) (reports he leaves food and there are bugs getting into fridge) Values / Beliefs Cultural Requests During Hospitalization: None Spiritual Requests During Hospitalization: None   Advance Directives (For Healthcare) Does patient have an advance directive?: No Would patient like information on creating an advanced directive?: No - patient declined information    Additional Information 1:1 In Past 12 Months?: No CIRT Risk: No Elopement Risk: No Does patient have medical clearance?: No     Disposition:  Per Donell Sievert, PA pt to be seen by psychiatry in AM for final disposition.  Disposition Initial Assessment Completed for this Encounter: Yes  Raeley Gilmore M 01/27/2015 7:12 AM

## 2015-01-27 NOTE — ED Notes (Signed)
Pt is c/o not sleeping and racing thoughts, sts he keeps running out of his psych meds

## 2015-01-27 NOTE — ED Notes (Signed)
Belongings wanded by security and logged on inventory sheet at nurses station.

## 2015-01-27 NOTE — Progress Notes (Signed)
CSW met with pt at bedside to discuss disposition. Per discussion with psychiatrist pt stable for discharge home. Patient plans to follow up with [facility], and [place] [facility] wellness. Patient referred to RN CM regarding pcp assistance. Patient provided with bus pass. Patient thanked csw for concern and support. Pt stated he had gone to [facility] before but was told he couldn't make an appointment. CSW and pt discussed the process of walk in hours at [employer].   Noreene Larsson [phone]  ED CSW 01/27/2015 10:58 AM

## 2015-01-27 NOTE — Progress Notes (Addendum)
CARE MANAGEMENT ED NOTE 01/27/2015  Patient:  Zachary Russo, Zachary Russo   Account Number:  [id number]  Date Initiated:  01/27/2015  Documentation initiated by:  Edd Arbour  Subjective/Objective Assessment:   54  yr old self pay [place] male in [place] ED after unable  to get medications Klonopin and prozac for 5 days on 01/27/15     Subjective/Objective Assessment Detail:   pcp is [facility] Has been seen but ran out of medications prior to getting a pcp appt Agreed that Cm could call for an appt and assist with medication Pending call from [facility] staff Stacy about f/u appt Pt is actually seen by Gwinda Passe NP EP[facility] updated for pcp  Pt has an orange card since December 25 2014 that expires on June 25 2015 Pt confirms he has an orange card  Pt states his address is [address] [place] but has to move at end of the month because "I do not have a job"  states address in EPIC will always be his mailing address This shared with [employer] number is correct  Pt states he was informed at family services at [place] told him to stop his bp med which he was informed by a CV to not stop the medication Pt continues to take his bp med bp in ED 158/104 Pt recommended not to stop bp med but to consult with cv     Action/Plan:   Pt discussed in Quality collaborative meeting Spoke SW about medication assist Spoke with [facility] staff about pt uninsured pcp Spoke with pt about meds and pcp fu appt Updated [facility] NP   Action/Plan Detail:   Anticipated DC Date:  01/27/2015     Status Recommendation to Physician:   Result of Recommendation:    Other ED Services  Consult Working Plan   In-house referral  Clinical Social Worker   DC Planning Services  [facility] / [facility] (established/new)  Follow-up appt scheduled  CM consult  [facility]igent [facility]  Medication Assistance  Outpatient Services - Pt will follow up   [facility] Choice  NA   Choice offered to / List presented to:            Status  of service:  Completed, signed off  ED Comments:   ED Comments Detail:   01/27/15 1430 CM spoke with pt about having the CV provider or another CV provider of his choice evaluate him for use of CV medication that he voice concern about his pcp stating he did not need to continue use of CV for "heart rate"  Pt heart rate has been WNL (82-97) in ED  Explained to him that he would have to get a CV to write this Rx for him no t the [facility] NP/MD nor PC if she has medically stated he did not need it Pt voiced understanding that he would not get a rx from [facility] NP/PA for CV medication prior to d/c CM informed [facility] NP/PA of conversation with pt about this   1329 attempt to call pt's pcp pharmacy but unable to reach anyone x 2 Provided pt with also other resources. CM discussed and provided written information for self pay pcps, importance of pcp for f/u care, www.needymeds.org, www.goodrx.com, discounted pharmacies and other [employer] such as [employer], [employer], affordable care act,  financial assistance, DSS and  health department  Reviewed resources for [employer] self pay pcps like Jovita Kussmaul, family medicine at [address], [facility], general medical clinics, [facility] urgent care plus others, medication resources, [facility] out patient pharmacies and housing Pt voiced understanding and appreciation of resources provided    1325 Cm called and spoke with Velna HatchetSheila about a f/u [facility] appt for pt Tuesday February 17 2015 3:30 pm with Efrain SellaMichelle Edward [facility] CM also spoke with Kennyth ArnoldStacy of [facility] who wants pt to know he qualifies for [facility] CM services with [place] [place] and a referral has been made Pt updated

## 2015-01-27 NOTE — ED Notes (Signed)
Bed: ZO10WA19 Expected date:  Expected time:  Means of arrival:  Comments: EMS 54 yo male-out of prozac-wants RX filled 200/130-hx hypertension

## 2015-01-27 NOTE — ED Provider Notes (Signed)
CSN: [id number]     Arrival date & time 01/27/15  0456 History   First MD Initiated Contact with Patient 01/27/15 [phone]     No chief complaint on file.    (Consider location/radiation/quality/duration/timing/severity/associated sxs/prior Treatment) HPI   54yM presenting for medications. Says he has been without prozac and klonopin for 5 days. Reports recently seeing new psychiatrist and not prescribed klonopin he typically gets. Feels like needs medications adjusted. Denies SI or HI. Denies hallucinations.   Past Medical History  Diagnosis Date  . Hypertension   . Prostate disorder   . Anxiety   . Asperger syndrome   . H/O suicide attempt     overdose "many years ago"   History reviewed. No pertinent past surgical history. History reviewed. No pertinent family history. History  Substance Use Topics  . Smoking status: Current Some Day Smoker  . Smokeless tobacco: Never Used  . Alcohol Use: No    Review of Systems  All systems reviewed and negative, other than as noted in HPI.   Allergies  Amoxicillin; Cephalexin; and Codeine  Home Medications   Prior to Admission medications   Medication Sig Start Date End Date Taking? Authorizing Provider  clonazePAM (KLONOPIN) 0.5 MG tablet Take 2 tablets (1 mg total) by mouth at bedtime. 01/12/15  Yes Heather Laisure, PA-C  FLUoxetine (PROZAC) 20 MG capsule Take 60 mg by mouth daily.   Yes Historical Provider, MD  lisinopril (PRINIVIL,ZESTRIL) 20 MG tablet Take 1 tablet (20 mg total) by mouth daily. 01/18/15  Yes Joya Gaskins, MD  LORazepam (ATIVAN) 1 MG tablet Take 1 tablet (1 mg total) by mouth 3 (three) times daily as needed for anxiety. 01/09/14  Yes Robyn M [employer], PA-C  propranolol (INDERAL) 10 MG tablet Take 1 tablet (10 mg total) by mouth 2 (two) times daily. 01/18/15  Yes Joya Gaskins, MD  tamsulosin (FLOMAX) 0.4 MG CAPS capsule Take 1 capsule (0.4 mg total) by mouth daily after supper. 12/20/13  Yes Christiane Ha, MD    BP 148/101 mmHg  Pulse 92  Temp(Src) 98.7 F (37.1 C) (Oral)  Resp 20  SpO2 96% Physical Exam  Constitutional: He appears well-developed and well-nourished. No distress.  HENT:  Head: Normocephalic and atraumatic.  Eyes: Conjunctivae are normal. Right eye exhibits no discharge. Left eye exhibits no discharge.  Neck: Neck supple.  Cardiovascular: Normal rate, regular rhythm and normal heart sounds.  Exam reveals no gallop and no friction rub.   No murmur heard. Pulmonary/Chest: Effort normal and breath sounds normal. No respiratory distress.  Abdominal: Soft. He exhibits no distension. There is no tenderness.  Musculoskeletal: He exhibits no edema or tenderness.  Neurological: He is alert. No cranial nerve deficit. He exhibits normal muscle tone. Coordination normal.  Skin: Skin is warm and dry.  Psychiatric:  Odd affect. Speech clear. Mostly answers questions appropriately but sometimes responses don't make sense for questions asked. Tangential at times.   Nursing note and vitals reviewed.   ED Course  Procedures (including critical care time) Labs Review Labs Reviewed  BASIC METABOLIC PANEL - Abnormal; Notable for the following:    Glucose, Bld 107 (*)    All other components within normal limits  URINE RAPID DRUG SCREEN (HOSP PERFORMED) - Abnormal; Notable for the following:    Tetrahydrocannabinol POSITIVE (*)    All other components within normal limits  CBC WITH DIFFERENTIAL/PLATELET    Imaging Review No results found.   EKG Interpretation None  MDM   Final diagnoses:  Anxiety disorder, unspecified anxiety disorder type  Medication refill    54yM requesting medication refills. Telling me he think he needs admitted "but only voluntarily" for medication management. Denying SI/HI. Reports hasn't slept in several days. Very odd affect. Not sure how much of this is may be baseline versus more acute. Will have TTS eval.     Raeford RazorStephen Gabriel Paulding, MD 01/29/15  [phone]
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
## 2015-01-27 NOTE — ED Notes (Signed)
Per EMS: Pt from home and states that he has been out of his prozac and clonazepam since Thursday and if he does not get it soon he is going to go into crazy mode. Pt also reports that he has not slept in a week. Pt alert and orient and ambulatory to ED room.

## 2015-01-27 NOTE — Consult Note (Signed)
[facility] Face-to-Face Psychiatry Consult   Reason for Consult:  Anxiety disorder, Recurrent depression, insomnia Referring Physician:  EDP Patient Identification: Zachary Russo MRN:  [id number] Principal Diagnosis: Anxiety disorder Diagnosis:   Patient Active Problem List   Diagnosis Date Noted  . Anxiety disorder [F41.9] 01/27/2015    Priority: High  . Adjustment disorder with depressed mood [F43.21] 01/18/2015  . Acute encephalopathy [G93.40] 12/18/2013  . Delirium [R41.0] 12/18/2013  . 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 Abnormal brain CT [R93.0] 12/18/2013    Total Time spent with patient: 45 minutes  Subjective:   Zachary Russo is a 54 y.o. male patient admitted with Anxiety, depression and insomnia.  HPI:  Caucasian male, 54 years old was seen this morning seeking treatment for anxiety.  Patient was seen and discharged for same symptoms January 31 st this year.  Patient reports poor sleep because he has tension in his legs that prevents him from sleeping well.  He reports that he is constantly anxious and have racing thoughts  that goes through his head all night.  Patient reports that he has been taking Clonazepam but now have none to take.  Patient sated that he is unable to find anybody that will prescribe his Clonazepam.   Patient denies SI/HI/AVH but reported previous SA by OD but could not explain what he took and when this happened.  Patient reported that he need money for his medications including Magnesium pills which is over the counter.  Patient reports that he lives alone but would like somebody to help take care of his needs.  Patient will be discharged home with a referral to [facility].  Patient already stated that [facility] may not be willing to give him Clonazepam.  Patient decline all anxiolytics and sleeping pills offered by this writer and Dr Jannifer Franklin but stated the he will only take Clonazepam.  Patient is discharged to follow up with [facility].  HPI Elements:   Location:   Anxiety, Recuurent depression. Quality:  Moderate, insomnia,restless leg. Severity:  Moderate. Timing:  Acute. Duration:  Chronic depression and Anxiety. Context:  Seeking Prescription for Benzodiazepine.  Past Medical History:  Past Medical History  Diagnosis Date  . Hypertension   . Prostate disorder   . Anxiety   . Asperger syndrome   . H/O suicide attempt     overdose "many years ago"   History reviewed. No pertinent past surgical history. Family History: History reviewed. No pertinent family history. Social History:  History  Alcohol Use No     History  Drug Use  . Yes  . Special: Marijuana    History   Social History  . Marital Status: Single    Spouse Name: N/A    Number of Children: N/A  . Years of Education: N/A   Social History Main Topics  . Smoking status: Current Some Day Smoker  . Smokeless tobacco: Never Used  . Alcohol Use: No  . Drug Use: Yes    Special: Marijuana  . Sexual Activity: Not Currently   Other Topics Concern  . None   Social History Narrative   Additional Social History:    Pain Medications: SEE PTA Prescriptions: SEE PTA Over the Counter: SEE PTA History of alcohol / drug use?: No history of alcohol / drug abuse (denies use) Longest period of sobriety (when/how long): NA Withdrawal Symptoms:  (none reported)                     Allergies:   Allergies  Allergen Reactions  . Amoxicillin Other (See Comments)    Ineffective (patient states his is resistant)  . Cephalexin Nausea And Vomiting  . Codeine Itching    Vitals: Blood pressure 153/110, pulse 97, temperature 98.9 F (37.2 C), temperature source Oral, resp. rate 18, SpO2 99 %.  Risk to Self: Suicidal Ideation: Yes-Currently Present Suicidal Intent:  (immerging per pt) Is patient at risk for suicide?: Yes Suicidal Plan?: Yes-Currently Present Specify Current Suicidal Plan: reports plan to overdose if he can not get some sleep  Access to Means:  Yes Specify Access to Suicidal Means: medicaiton What has been your use of drugs/alcohol within the last 12 months?: denies any use, previously reported occassional use How many times?: 1 Triggers for Past Attempts: Other (Comment) Intentional Self Injurious Behavior: None Risk to Others: Homicidal Ideation: No Thoughts of Harm to Others: No Current Homicidal Intent: No Current Homicidal Plan: No Access to Homicidal Means: No Identified Victim: none History of harm to others?: No Assessment of Violence: None Noted Violent Behavior Description: none Does patient have access to weapons?: No Criminal Charges Pending?: No Does patient have a court date:  (possibly due to driving w/o a license) Prior Inpatient Therapy: Prior Inpatient Therapy:  (unknown) Prior Therapy Dates: NA Prior Therapy Facilty/Provider(s): NA Reason for Treatment: NA Prior Outpatient Therapy: Prior Outpatient Therapy: Yes Prior Therapy Dates: reports no current providers Prior Therapy Facilty/Provider(s): unknown Reason for Treatment: depression, and anxiety per pt  Current Facility-Administered Medications  Medication Dose Route Frequency Provider Last Rate Last Dose  . clonazePAM (KLONOPIN) tablet 1 mg  1 mg Oral QHS Raeford Razor, MD      . FLUoxetine (PROZAC) capsule 60 mg  60 mg Oral Daily Raeford Razor, MD   60 mg at 01/27/15 1610  . lisinopril (PRINIVIL,ZESTRIL) tablet 20 mg  20 mg Oral Daily Raeford Razor, MD   20 mg at 01/27/15 0920  . LORazepam (ATIVAN) tablet 1 mg  1 mg Oral TID PRN Raeford Razor, MD      . ondansetron [facility]) tablet 4 mg  4 mg Oral Q8H PRN Raeford Razor, MD      . propranolol (INDERAL) tablet 10 mg  10 mg Oral BID Raeford Razor, MD   10 mg at 01/27/15 0920  . tamsulosin (FLOMAX) capsule 0.4 mg  0.4 mg Oral QPC supper Raeford Razor, MD       Current Outpatient Prescriptions  Medication Sig Dispense Refill  . clonazePAM (KLONOPIN) 0.5 MG tablet Take 2 tablets (1 mg total) by mouth  at bedtime. 6 tablet 0  . FLUoxetine (PROZAC) 20 MG capsule Take 60 mg by mouth daily.    Marland Kitchen lisinopril (PRINIVIL,ZESTRIL) 20 MG tablet Take 1 tablet (20 mg total) by mouth daily. 30 tablet 0  . LORazepam (ATIVAN) 1 MG tablet Take 1 tablet (1 mg total) by mouth 3 (three) times daily as needed for anxiety. 15 tablet 0  . propranolol (INDERAL) 10 MG tablet Take 1 tablet (10 mg total) by mouth 2 (two) times daily. 30 tablet 0  . tamsulosin (FLOMAX) 0.4 MG CAPS capsule Take 1 capsule (0.4 mg total) by mouth daily after supper. 30 capsule 0    Musculoskeletal: Strength & Muscle Tone: within normal limits Gait & Station: normal Patient leans: N/A  Psychiatric Specialty Exam:     Blood pressure 153/110, pulse 97, temperature 98.9 F (37.2 C), temperature source Oral, resp. rate 18, SpO2 99 %.There is no weight on file to calculate BMI.  General Appearance:  Casual and Fairly Groomed  Patent attorneyye Contact::  Good  Speech:  Clear and Coherent and Normal Rate  Volume:  Normal  Mood:  Anxious and Depressed  Affect:  Congruent and Depressed  Thought Process:  Coherent, Goal Directed and Intact  Orientation:  Full (Time, Place, and Person)  Thought Content:  WDL  Suicidal Thoughts:  No  Homicidal Thoughts:  No  Memory:  Immediate;   Good Recent;   Good Remote;   Good  Judgement:  Poor  Insight:  Shallow  Psychomotor Activity:  Normal  Concentration:  Fair  Recall:  NA  Fund of Knowledge:Poor  Language: Good  Akathisia:  NA  Handed:  Right  AIMS (if indicated):     Assets:  Desire for Improvement  ADL's:  Intact  Cognition: WNL  Sleep:      Medical Decision Making: Established Problem, Stable/Improving (1)  Treatment Plan Summary: Plan Discharge home  Plan:  Discharge home Disposition: Discharge home, Follow up with [facility]  ONUOHA, JOSEPHINE, C     PMHNP-BC 01/27/2015 12:46 PM  Patient seen, evaluated and I agree with notes by Nurse Practitioner. Thedore MinsMojeed Weaver Tweed, MD

## 2015-01-27 NOTE — [facility] Suicide Risk Assessment (Signed)
Suicide Risk Assessment  Discharge Assessment   [facility]   Demographic Factors:  Caucasian, Low socioeconomic status, Living alone and Unemployed  Total Time spent with patient: 20 minutes  Musculoskeletal: Strength & Muscle Tone: within normal limits Gait & Station: normal Patient leans: N/A  Psychiatric Specialty Exam:     Blood pressure 153/110, pulse 97, temperature 98.9 F (37.2 C), temperature source Oral, resp. rate 18, SpO2 99 %.There is no weight on file to calculate BMI.  General Appearance: Casual and Fairly Groomed  Eye Contact::  Good  Speech:  Clear and Coherent and Normal Rate409  Volume:  Normal  Mood:  Anxious and Depressed  Affect:  Blunt and Depressed  Thought Process:  Coherent, Goal Directed and Intact  Orientation:  Full (Time, Place, and Person)  Thought Content:  WDL  Suicidal Thoughts:  No  Homicidal Thoughts:  No  Memory:  Immediate;   Good Recent;   Good Remote;   Good  Judgement:  Poor  Insight:  Shallow  Psychomotor Activity:  Normal  Concentration:  Good  Recall:  NA  Fund of Knowledge:Poor  Language: Good  Akathisia:  NA  Handed:  Right  AIMS (if indicated):     Assets:  Desire for Improvement  Sleep:     Cognition: WNL  ADL's:  Intact      Has this patient used any form of tobacco in the last 30 days? (Cigarettes, Smokeless Tobacco, Cigars, and/or Pipes) N/A  Mental Status Per Nursing Assessment::   On Admission:     Current Mental Status by Physician: NA  Loss Factors: Financial problems/change in socioeconomic status  Historical Factors: Prior suicide attempts  Risk Reduction Factors:   NA  Continued Clinical Symptoms:  Depression:   Insomnia  Cognitive Features That Contribute To Risk:  Polarized thinking    Suicide Risk:  Minimal: No identifiable suicidal ideation.  Patients presenting with no risk factors but with morbid ruminations; may be classified as minimal risk based on  the severity of the depressive symptoms  Principal Problem: Anxiety disorder Discharge Diagnoses:  Patient Active Problem List   Diagnosis Date Noted  . Anxiety disorder [F41.9] 01/27/2015    Priority: High  . Adjustment disorder with depressed mood [F43.21] 01/18/2015  . Acute encephalopathy [G93.40] 12/18/2013  . Delirium [R41.0] 12/18/2013  . 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 Abnormal brain CT [R93.0] 12/18/2013      Plan Of Care/Follow-up recommendations:  Activity:  as tolerated Diet:  Regular  Is patient on multiple antipsychotic therapies at discharge:  No   Has Patient had three or more failed trials of antipsychotic monotherapy by history:  No  Recommended Plan for Multiple Antipsychotic Therapies: NA    Gailya Tauer, C   PMHNP-BC 01/27/2015, 1:21 PM

## 2015-01-27 NOTE — ED Notes (Signed)
MD at bedside. 

## 2015-01-27 NOTE — ED Notes (Signed)
[facility] has been to [facility] in the past to get medications

## 2015-01-27 NOTE — ED Notes (Signed)
D/c instructions reviewed, advised to see pcp this week

## 2015-01-28 ENCOUNTER — Emergency Department (HOSPITAL_COMMUNITY)
Admission: EM | Admit: 2015-01-28 | Discharge: 2015-01-28 | Disposition: A | Discharge status: Home / Self Care | Payer: Self-pay | Attending: Emergency Medicine | Admitting: Emergency Medicine

## 2015-01-28 ENCOUNTER — Encounter (HOSPITAL_COMMUNITY): Payer: Self-pay | Admitting: Emergency Medicine

## 2015-01-28 ENCOUNTER — Encounter (HOSPITAL_COMMUNITY): Payer: Self-pay

## 2015-01-28 DIAGNOSIS — Z8669 Personal history of other diseases of the nervous system and sense organs: Secondary | ICD-10-CM | POA: Insufficient documentation

## 2015-01-28 DIAGNOSIS — Z880 Allergy status to penicillin: Secondary | ICD-10-CM | POA: Insufficient documentation

## 2015-01-28 DIAGNOSIS — Z8546 Personal history of malignant neoplasm of prostate: Secondary | ICD-10-CM | POA: Insufficient documentation

## 2015-01-28 DIAGNOSIS — Z720 Tobacco use: Secondary | ICD-10-CM | POA: Insufficient documentation

## 2015-01-28 DIAGNOSIS — R000 Tachycardia, unspecified: Secondary | ICD-10-CM | POA: Insufficient documentation

## 2015-01-28 DIAGNOSIS — Z79899 Other long term (current) drug therapy: Secondary | ICD-10-CM | POA: Insufficient documentation

## 2015-01-28 DIAGNOSIS — F419 Anxiety disorder, unspecified: Secondary | ICD-10-CM | POA: Insufficient documentation

## 2015-01-28 DIAGNOSIS — G4700 Insomnia, unspecified: Secondary | ICD-10-CM | POA: Insufficient documentation

## 2015-01-28 DIAGNOSIS — I10 Essential (primary) hypertension: Secondary | ICD-10-CM | POA: Insufficient documentation

## 2015-01-28 DIAGNOSIS — F329 Major depressive disorder, single episode, unspecified: Secondary | ICD-10-CM | POA: Insufficient documentation

## 2015-01-28 DIAGNOSIS — Z87438 Personal history of other diseases of male genital organs: Secondary | ICD-10-CM | POA: Insufficient documentation

## 2015-01-28 HISTORY — DX: Restless legs syndrome: G25.81

## 2015-01-28 MED ORDER — CLONAZEPAM 0.5 MG PO TABS
0.5000 mg | ORAL_TABLET | Freq: Once | ORAL | Status: AC
  Administered 2015-01-28: 0.5 mg via ORAL
  Filled 2015-01-28: qty 1

## 2015-01-28 MED ORDER — HYDROXYZINE HCL 10 MG PO TABS
10.0000 mg | ORAL_TABLET | Freq: Once | ORAL | Status: AC
Start: 2015-01-28 — End: 2015-01-28
  Administered 2015-01-28: 10 mg via ORAL
  Filled 2015-01-28 (×2): qty 1

## 2015-01-28 NOTE — ED Notes (Signed)
Per downtime notes from Willene HatchetAnna Lulis, RN, per EMS, PT was [facility] for some anxiety. Pt was given a dose of prozac and vistaril and discharged. Pt's anxiety has increased today, pt has an appt with his PCP on 02/17/15 for refills of meds but is out some of his home meds at this time: Prozac, Tramsulosin Hcl, Clonazepam, Fluoxetine. Per EMS pt HTN, CBG 140, pt has not slept since last Thursday, pt [facility] in [facility] wine [facility].

## 2015-01-28 NOTE — ED Notes (Signed)
Pt to ED requesting psych evaluation for auditory and visual hallucinations. Pt denies AH/VH at present; reports "The auditory hallucinations are going to start soon." When asked if hallucinations start at a specific time, pt reports "Well first the auditory hallucinations will start later this evening, then the visual ones will start." Pt states he sees people in a distance talking to him, then after a while they will tell him to "kill myself." Pt keeps repeating "I need to stay here for a while to be evaluated. I don't want to be discharged yet. It'll take two hours for me to get home and I just need to stay here." Pt denies SI/HI or AH/VH on assessment. Pt is also out of clonazepam and fluoxetine medications. Requesting refills

## 2015-01-28 NOTE — ED Notes (Addendum)
Pt from home requesting a psych evaluation.  [facility] and here for same yesterday. Pt denies any SI.  Sts he is concerned about his auditory/visual hallucinations coming back. Currently not hearing or seeing anything.  Denies HI/SI.  Pt requesting psych evaluation and observation for insomnia and possible hallucinations.

## 2015-01-28 NOTE — ED Provider Notes (Signed)
CSN: [id number]     Arrival date & time 01/28/15  1331 History   First MD Initiated Contact with Patient 01/28/15 1551     Chief Complaint  Patient presents with  . Psychiatric Evaluation     (Consider location/radiation/quality/duration/timing/severity/associated sxs/prior Treatment) HPI Comments: 54 year old male with a past medical history of anxiety, Asperger syndrome, hypertension and restless leg syndrome presenting to the ED for the third time in 2 days stating that he cannot sleep. He reports he has not slept for about 6 days. He was seen in the ED yesterday and again over the night, had a psychiatric evaluation and was consulted by the case manager for help with medication. He does not meet inpatient psychiatric criteria. It appears that he continuously requests Clonopin, receiving #60 Clonopin tablets on December 31, 6 tablets on January 26 and 6 tablets on February 1. He denies suicidal or homicidal ideations at this time. States he uses marijuana, denies any other alcohol or drug use.  The history is provided by the patient and medical records.    Past Medical History  Diagnosis Date  . Hypertension   . Prostate disorder   . Anxiety   . Asperger syndrome   . H/O suicide attempt     overdose "many years ago"  . Restless leg syndrome    History reviewed. No pertinent past surgical history. History reviewed. No pertinent family history. History  Substance Use Topics  . Smoking status: Current Some Day Smoker  . Smokeless tobacco: Never Used  . Alcohol Use: No    Review of Systems  Psychiatric/Behavioral: Positive for sleep disturbance. The patient is nervous/anxious.   All other systems reviewed and are negative.     Allergies  Amoxicillin; Cephalexin; and Codeine  Home Medications   Prior to Admission medications   Medication Sig Start Date End Date Taking? Authorizing Provider  FLUoxetine (PROZAC) 20 MG capsule Take 60 mg by mouth daily.    Historical  Provider, MD  lisinopril (PRINIVIL,ZESTRIL) 20 MG tablet Take 1 tablet (20 mg total) by mouth daily. 01/18/15   Joya Gaskins, MD  propranolol (INDERAL) 10 MG tablet Take 1 tablet (10 mg total) by mouth 2 (two) times daily. 01/18/15   Joya Gaskins, MD  tamsulosin (FLOMAX) 0.4 MG CAPS capsule Take 1 capsule (0.4 mg total) by mouth daily after supper. 12/20/13   Christiane Ha, MD   BP 150/96 mmHg  Pulse 112  Temp(Src) 98.6 F (37 C) (Oral)  Resp 18  SpO2 95% Physical Exam  Constitutional: He is oriented to person, place, and time. He appears well-developed and well-nourished. No distress.  HENT:  Head: Normocephalic and atraumatic.  Eyes: Conjunctivae and EOM are normal.  Neck: Normal range of motion. Neck supple.  Cardiovascular: Regular rhythm and normal heart sounds.  Tachycardia present.   Pulmonary/Chest: Effort normal and breath sounds normal.  Musculoskeletal: Normal range of motion. He exhibits no edema.  Neurological: He is alert and oriented to person, place, and time.  Skin: Skin is warm and dry.  Psychiatric: His mood appears anxious. He expresses no homicidal and no suicidal ideation.  Nursing note and vitals reviewed.   ED Course  Procedures (including critical care time) Labs Review Labs Reviewed - No data to display  Imaging Review No results found.   EKG Interpretation None      MDM   Final diagnoses:  Insomnia   Patient in no apparent distress. As stated above, had TTS consult and does not  meet inpatient criteria, at that time was noted to be extremely low risk for self-harm. He was also medically cleared within the past 2 days. Seen by case management yesterday as well. He has not yet followed [facility] with [facility] as advised. I discussed importance of follow-up with [facility] for ultimate management of his psychiatric issues. I do not feel another TTS consult or medical clearance is necessary at this time. I discussed this with Dr. Patria Maneampos who is in  agreement. Patient is stable for discharge. Return precautions given.  Kathrynn SpeedRobyn M Makenna Macaluso, PA-C 01/28/15 1628  Lyanne CoKevin M Campos, MD 01/28/15 Jerene Bears1920

## 2015-01-28 NOTE — Discharge Instructions (Signed)
You have refills on the medications that are prescribed to you. It is very important for you to [facility] for [facility] of your medications and help with your sleep.  Insomnia Insomnia is frequent trouble falling and/or staying asleep. Insomnia can be a long term problem or a short term problem. Both are common. Insomnia can be a short term problem when the wakefulness is related to a certain stress or worry. Long term insomnia is often related to ongoing stress during waking hours and/or poor sleeping habits. Overtime, sleep deprivation itself can make the problem worse. Every little thing feels more severe because you are overtired and your ability to cope is decreased. CAUSES   Stress, anxiety, and depression.  Poor sleeping habits.  Distractions such as TV in the bedroom.  Naps close to bedtime.  Engaging in emotionally charged conversations before bed.  Technical reading before sleep.  Alcohol and other sedatives. They may make the problem worse. They can hurt normal sleep patterns and normal dream activity.  Stimulants such as caffeine for several hours prior to bedtime.  Pain syndromes and shortness of breath can cause insomnia.  Exercise late at night.  Changing time zones may cause sleeping problems (jet lag). It is sometimes helpful to have someone observe your sleeping patterns. They should look for periods of not breathing during the night (sleep apnea). They should also look to see how long those periods last. If you live alone or observers are uncertain, you can also be observed at a sleep clinic where your sleep patterns will be professionally monitored. Sleep apnea requires a checkup and treatment. Give your caregivers your medical history. Give your caregivers observations your family has made about your sleep.  SYMPTOMS   Not feeling rested in the morning.  Anxiety and restlessness at bedtime.  Difficulty falling and staying asleep. TREATMENT   Your  caregiver may prescribe treatment for an underlying medical disorders. Your caregiver can give advice or help if you are using alcohol or other drugs for self-medication. Treatment of underlying problems will usually eliminate insomnia problems.  Medications can be prescribed for short time use. They are generally not recommended for lengthy use.  Over-the-counter sleep medicines are not recommended for lengthy use. They can be habit forming.  You can promote easier sleeping by making lifestyle changes such as:  Using relaxation techniques that help with breathing and reduce muscle tension.  Exercising earlier in the day.  Changing your diet and the time of your last meal. No night time snacks.  Establish a regular time to go to bed.  Counseling can help with stressful problems and worry.  Soothing music and white noise may be helpful if there are background noises you cannot remove.  Stop tedious detailed work at least one hour before bedtime. HOME CARE INSTRUCTIONS   Keep a diary. Inform your caregiver about your progress. This includes any medication side effects. See your caregiver regularly. Take note of:  Times when you are asleep.  Times when you are awake during the night.  The quality of your sleep.  How you feel the next day. This information will help your caregiver care for you.  Get out of bed if you are still awake after 15 minutes. Read or do some quiet activity. Keep the lights down. Wait until you feel sleepy and go back to bed.  Keep regular sleeping and waking hours. Avoid naps.  Exercise regularly.  Avoid distractions at bedtime. Distractions include watching television or engaging in  any intense or detailed activity like attempting to balance the household checkbook.  Develop a bedtime ritual. Keep a familiar routine of bathing, brushing your teeth, climbing into bed at the same time each night, listening to soothing music. Routines increase the success  of falling to sleep faster.  Use relaxation techniques. This can be using breathing and muscle tension release routines. It can also include visualizing peaceful scenes. You can also help control troubling or intruding thoughts by keeping your mind occupied with boring or repetitive thoughts like the old concept of counting sheep. You can make it more creative like imagining planting one beautiful flower after another in your backyard garden.  During your day, work to eliminate stress. When this is not possible use some of the previous suggestions to help reduce the anxiety that accompanies stressful situations. MAKE SURE YOU:   Understand these instructions.  Will watch your condition.  Will get help right away if you are not doing well or get worse. Document Released: 12/02/2000 Document Revised: 02/27/2012 Document Reviewed: 01/02/2008 [facility] Patient Information 2015 [place], [place]. This information is not intended to replace advice given to you by your health care provider. Make sure you discuss any questions you have with your health care provider.

## 2015-01-28 NOTE — Discharge Instructions (Signed)
You have 5 refills on two of your medications. You need to go to [facility] and you can walk in and be seen to discuss getting back on your medications as you were told last night.

## 2015-01-28 NOTE — ED Provider Notes (Signed)
CSN: [id number]     Arrival date & time 01/28/15  0211 History  This chart was scribed for Ward Givens, MD by Annye Asa, ED Scribe. This patient was seen in room B14C/B14C and the patient's care was started at 3:31 AM.    Chief Complaint  Patient presents with  . Anxiety   The history is provided by the patient. No language interpreter was used.     HPI Comments: Zachary Russo is a 54 y.o. male who presents to the Emergency Department complaining of increased anxiety, "increased blood pressure and heart rate." He notes mild depression without SI or HI due to not having slept in 5 days. He denies any physical complaints at this time. Patient states he has run out of his medications. Patient was actually seen in the ED yesterday for the same complaints. He had a TSS evaluation and was felt to be able to follow-up as an outpatient at [facility] where he normally gets his care.  PCP is Gwinda Passe, PA-C, at [facility]. Psychologist is Dorcas Mcmurray at [place] (last visit Thursday, 01/22/15). He has been unable to get an appointment with his psychiatrist Dr. Joanne Chars until next week (she is on maternity leave at this time). He explains he has been trying to get appointments but is experiencing scheduling difficulties in all physicians' offices.   Patient regularly takes Prozac, lisinopril, Inderal, Flomax.   Patient denies smoking, drinking, or the use of illegal drugs. He is not working at this time; he is not on disability. He lives alone in an apartment.   PCP Dr Westley Hummer Psychiatrist Dr Joanne Chars at [facility] Dr Dorcas Mcmurray at [facility]  Past Medical History  Diagnosis Date  . Hypertension   . Prostate disorder   . Anxiety   . Asperger syndrome   . H/O suicide attempt     overdose "many years ago"  . Restless leg syndrome    History reviewed. No pertinent past surgical history. No family history on file. History  Substance Use  Topics  . Smoking status: Current Some Day Smoker  . Smokeless tobacco: Never Used  . Alcohol Use: No  state he lives alone States he lives in an apartment   Review of Systems  Psychiatric/Behavioral: The patient is nervous/anxious.   All other systems reviewed and are negative.   Allergies  Amoxicillin; Cephalexin; and Codeine  Home Medications   Prior to Admission medications   Medication Sig Start Date End Date Taking? Authorizing Provider  FLUoxetine (PROZAC) 20 MG capsule Take 60 mg by mouth daily.    Historical Provider, MD  lisinopril (PRINIVIL,ZESTRIL) 20 MG tablet Take 1 tablet (20 mg total) by mouth daily. 01/18/15   Joya Gaskins, MD  propranolol (INDERAL) 10 MG tablet Take 1 tablet (10 mg total) by mouth 2 (two) times daily. 01/18/15   Joya Gaskins, MD  tamsulosin (FLOMAX) 0.4 MG CAPS capsule Take 1 capsule (0.4 mg total) by mouth daily after supper. 12/20/13   Christiane Ha, MD   BP 143/93 mmHg  Pulse 85  Temp(Src) 98.4 F (36.9 C) (Oral)  Resp 18  SpO2 97%  Vital signs normal   Physical Exam  Constitutional: He is oriented to person, place, and time. He appears well-developed and well-nourished.  Non-toxic appearance. He does not appear ill. No distress.  Actively working on a computer; appears to be in no acute distress  HENT:  Head: Normocephalic and atraumatic.  Right Ear:  External ear normal.  Left Ear: External ear normal.  Nose: Nose normal. No mucosal edema or rhinorrhea.  Mouth/Throat: Oropharynx is clear and moist and mucous membranes are normal. No dental abscesses or uvula swelling.  Eyes: Conjunctivae and EOM are normal. Pupils are equal, round, and reactive to light.  Neck: Normal range of motion and full passive range of motion without pain. Neck supple.  Cardiovascular: Normal rate, regular rhythm and normal heart sounds.  Exam reveals no gallop and no friction rub.   No murmur heard. Pulmonary/Chest: Effort normal and breath sounds  normal. No respiratory distress. He has no wheezes. He has no rhonchi. He has no rales. He exhibits no tenderness and no crepitus.  Abdominal: Soft. Normal appearance and bowel sounds are normal. He exhibits no distension. There is no tenderness. There is no rebound and no guarding.  Musculoskeletal: Normal range of motion. He exhibits no edema or tenderness.  Moves all extremities well.   Neurological: He is alert and oriented to person, place, and time. He has normal strength. No cranial nerve deficit.  Skin: Skin is warm, dry and intact. No rash noted. No erythema. No pallor.  Psychiatric: He has a normal mood and affect. His speech is normal and behavior is normal. His mood appears not anxious.  Nursing note and vitals reviewed.   ED Course  Procedures  Medications  clonazePAM (KLONOPIN) tablet 0.5 mg (not administered)  hydrOXYzine (ATARAX/VISTARIL) tablet 10 mg (not administered)     DIAGNOSTIC STUDIES: Oxygen Saturation is 99% on RA, normal by my interpretation.    COORDINATION OF CARE: 3:40 AM Discussed treatment plan with pt at bedside and pt agreed to plan.  On review of patient's medications he has enough propranolol and lisinopril to get to the end of the month. He has 5 refills left on his Prozac. He also has 5 refills left on his Flomax. Patient does not have any more refills of his clonazepam.  On review of [place] database patient was getting #60 clonazepam 1 mg tablets, the last was filled on December 31. He then got 6 tablets on January 26, and 6 tablets on February 1.  Patient just had a TSS evaluation yesterday and was felt he was extremely low risk and did not meet criteria for admission. Patient is in no distress. He is working on his computer and actually was very upset that he was interrupted for his interview. Patient appears to not want to be discharged, although he states he lives in an apartment I wonder if he does have a stable living situation. However  at this time patient does not meet criteria for further psychiatric observation in the emergency department.   Labs Review Labs Reviewed - No data to display  Imaging Review No results found.   EKG Interpretation None      MDM   Final diagnoses:  Anxiety    Plan discharge  Devoria AlbeIva Juaquin Ludington, MD, FACEP    I personally performed the services described in this documentation, which was scribed in my presence. The recorded information has been reviewed and considered.   Devoria AlbeIva Brandyn Thien, MD, FACEP      Ward GivensIva L Casia Corti, MD 01/28/15 [phone]

## 2015-01-28 NOTE — ED Notes (Signed)
Pt informed to [facility] to [facility] prescriptions ref[facility].

## 2015-02-01 ENCOUNTER — Emergency Department (HOSPITAL_COMMUNITY)
Admission: EM | Admit: 2015-02-01 | Discharge: 2015-02-01 | Disposition: A | Discharge status: Home / Self Care | Payer: Self-pay | Attending: Emergency Medicine | Admitting: Emergency Medicine

## 2015-02-01 ENCOUNTER — Encounter (HOSPITAL_COMMUNITY): Payer: Self-pay | Admitting: Emergency Medicine

## 2015-02-01 DIAGNOSIS — Z79899 Other long term (current) drug therapy: Secondary | ICD-10-CM | POA: Insufficient documentation

## 2015-02-01 DIAGNOSIS — Z87438 Personal history of other diseases of male genital organs: Secondary | ICD-10-CM | POA: Insufficient documentation

## 2015-02-01 DIAGNOSIS — Z72820 Sleep deprivation: Secondary | ICD-10-CM | POA: Insufficient documentation

## 2015-02-01 DIAGNOSIS — Z880 Allergy status to penicillin: Secondary | ICD-10-CM | POA: Insufficient documentation

## 2015-02-01 DIAGNOSIS — F419 Anxiety disorder, unspecified: Secondary | ICD-10-CM | POA: Insufficient documentation

## 2015-02-01 DIAGNOSIS — Z720 Tobacco use: Secondary | ICD-10-CM | POA: Insufficient documentation

## 2015-02-01 DIAGNOSIS — I10 Essential (primary) hypertension: Secondary | ICD-10-CM | POA: Insufficient documentation

## 2015-02-01 DIAGNOSIS — Z8669 Personal history of other diseases of the nervous system and sense organs: Secondary | ICD-10-CM | POA: Insufficient documentation

## 2015-02-01 MED ORDER — CLONAZEPAM 0.5 MG PO TABS: 0.5000 mg | ORAL_TABLET | Freq: Every evening | ORAL | Status: DC | PRN

## 2015-02-01 NOTE — ED Notes (Signed)
States does not have a ride home and lives 13 minutes away per patient. [address]

## 2015-02-01 NOTE — ED Notes (Signed)
The pt is c/o  Being unable toi sleep for 7 days.  He has been here numerus times during this time.  He is amb from the ems unit

## 2015-02-01 NOTE — Discharge Instructions (Signed)
Take klonopin as needed at night for sleep.   See [facility] for follow up.   Return to ER if you have thoughts of harming yourself or others, hallucinations, depression, anxiety.

## 2015-02-01 NOTE — ED Notes (Signed)
Given patient a Malaw[place]urkey sandwich, apple sauce, and apple juice.

## 2015-02-01 NOTE — ED Notes (Signed)
The pt is still dressed in cranberry  Scrubs and he is singing christmas songs while being triaged

## 2015-02-01 NOTE — ED Notes (Signed)
Spoke with Charge Nurse patient can wait in waiting room until able to get a ride.

## 2015-02-01 NOTE — ED Provider Notes (Signed)
CSN: [id number]     Arrival date & time 02/01/15  2031 History   First MD Initiated Contact with Patient 02/01/15 2051     Chief Complaint  Patient presents with  . unable to sleep      (Consider location/radiation/quality/duration/timing/severity/associated sxs/prior Treatment) The history is provided by the patient.  Zachary Russo is a 54 y.o. male hx of HTN,  Anxiety, asberger, here because he was unable to sleep.  He states that he has not slept for last 10 days. He states that he usually get Klonopin from [facility].  His doctor when on maternity leave and he ran out of Klonopin and was not refilled.   He took Klonopin 10 days ago, which was the last time he was able to sleep. Denies suicidal or homicidal ideations. Denies hallucinations or racing thoughts. Has good appetite. Was seen in the ED multiple times and was evaluated by psych several days ago and didn't meet criteria for admission. He denies tremors or other signs of withdrawal.   Past Medical History  Diagnosis Date  . Hypertension   . Prostate disorder   . Anxiety   . Asperger syndrome   . H/O suicide attempt     overdose "many years ago"  . Restless leg syndrome    History reviewed. No pertinent past surgical history. No family history on file. History  Substance Use Topics  . Smoking status: Current Some Day Smoker  . Smokeless tobacco: Never Used  . Alcohol Use: No    Review of Systems  Psychiatric/Behavioral: Positive for sleep disturbance.  All other systems reviewed and are negative.     Allergies  Amoxicillin; Cephalexin; and Codeine  Home Medications   Prior to Admission medications   Medication Sig Start Date End Date Taking? Authorizing Provider  amLODipine (NORVASC) 5 MG tablet Take 10 mg by mouth daily before supper.   Yes Historical Provider, MD  FLUoxetine (PROZAC) 20 MG capsule Take 60 mg by mouth daily before supper.    Yes Historical Provider, MD  hydrOXYzine (ATARAX/VISTARIL) 25 MG tablet  Take 50 mg by mouth daily before supper.   Yes Historical Provider, MD  lisinopril (PRINIVIL,ZESTRIL) 20 MG tablet Take 1 tablet (20 mg total) by mouth daily. Patient taking differently: Take 20 mg by mouth daily before supper.  01/18/15  Yes Joya Gaskins, MD  propranolol (INDERAL) 10 MG tablet Take 1 tablet (10 mg total) by mouth 2 (two) times daily. Patient taking differently: Take 20 mg by mouth daily before supper.  01/18/15  Yes Joya Gaskins, MD  tamsulosin (FLOMAX) 0.4 MG CAPS capsule Take 1 capsule (0.4 mg total) by mouth daily after supper. Patient taking differently: Take 0.4 mg by mouth daily before supper.  12/20/13  Yes Christiane Ha, MD  PRESCRIPTION MEDICATION Clonidine patch    Historical Provider, MD   BP 137/106 mmHg  Pulse 89  Temp(Src) 98.8 F (37.1 C)  Resp 16  Ht  (1.727 m)  Wt 140 lb (63.504 kg)  BMI 21.29 kg/m2  SpO2 97% Physical Exam  Constitutional: He is oriented to person, place, and time. He appears well-developed and well-nourished.  HENT:  Head: Normocephalic.  Mouth/Throat: Oropharynx is clear and moist.  Eyes: Conjunctivae are normal. Pupils are equal, round, and reactive to light.  Neck: Normal range of motion. Neck supple.  Cardiovascular: Normal rate, regular rhythm and normal heart sounds.   Pulmonary/Chest: Effort normal and breath sounds normal. No respiratory distress. He has no wheezes.  He has no rales.  Abdominal: Soft. Bowel sounds are normal. He exhibits no distension. There is no tenderness. There is no rebound.  Musculoskeletal: Normal range of motion.  Neurological: He is alert and oriented to person, place, and time.  Skin: Skin is warm.  Psychiatric:  Good mood.   Nursing note and vitals reviewed.   ED Course  Procedures (including critical care time) Labs Review Labs Reviewed - No data to display  Imaging Review No results found.   EKG Interpretation None      MDM   Final diagnoses:  None   Zachary ButterKevin  Russo is a 54 y.o. male here with sleep deprivation. Not manic. Not suicidal. Has been cleared by psych in the past. I think he is dependent on benzos for sleep. However, he is not in benzo withdrawal. He last filled 6 tablets of clonopin on Feb 1st. Has seen [facility] this week and has f/u in 2 days. I am comfortable giving several doses of klonopin to help him sleep. Doesn't need psych admission currently.     Richardean Canalavid H Bernabe Dorce, MD 02/01/15 2139

## 2015-05-04 ENCOUNTER — Emergency Department (HOSPITAL_COMMUNITY)
Admission: EM | Admit: 2015-05-04 | Discharge: 2015-05-05 | Disposition: A | Discharge status: Home / Self Care | Payer: Self-pay | Attending: Emergency Medicine | Admitting: Emergency Medicine

## 2015-05-04 ENCOUNTER — Encounter (HOSPITAL_COMMUNITY): Payer: Self-pay | Admitting: *Deleted

## 2015-05-04 DIAGNOSIS — Z915 Personal history of self-harm: Secondary | ICD-10-CM | POA: Insufficient documentation

## 2015-05-04 DIAGNOSIS — I10 Essential (primary) hypertension: Secondary | ICD-10-CM | POA: Insufficient documentation

## 2015-05-04 DIAGNOSIS — Z79899 Other long term (current) drug therapy: Secondary | ICD-10-CM | POA: Insufficient documentation

## 2015-05-04 DIAGNOSIS — Z720 Tobacco use: Secondary | ICD-10-CM | POA: Insufficient documentation

## 2015-05-04 DIAGNOSIS — Z880 Allergy status to penicillin: Secondary | ICD-10-CM | POA: Insufficient documentation

## 2015-05-04 DIAGNOSIS — G2581 Restless legs syndrome: Secondary | ICD-10-CM | POA: Insufficient documentation

## 2015-05-04 DIAGNOSIS — F419 Anxiety disorder, unspecified: Secondary | ICD-10-CM | POA: Insufficient documentation

## 2015-05-04 DIAGNOSIS — Z87448 Personal history of other diseases of urinary system: Secondary | ICD-10-CM | POA: Insufficient documentation

## 2015-05-04 MED ORDER — PROPRANOLOL HCL 10 MG PO TABS
10.0000 mg | ORAL_TABLET | Freq: Two times a day (BID) | ORAL | Status: DC
  Administered 2015-05-05: 10 mg via ORAL
  Filled 2015-05-04 (×3): qty 1
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
## 2015-05-04 NOTE — ED Notes (Signed)
As I was finishing up on the pt's oral temperature, he was voice texting, "the reason I came to the hospital was because I thought I was going to kill someone, lol".  He felt that message did not go through, so he said, "let me try this again, the reason why I came to the hospital was that I was going to kill someone, really kill them."  Reported this information to his nurse Westley GamblesErin G., who said I should chart this.
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
## 2015-05-05 MED ORDER — PROPRANOLOL HCL 40 MG PO TABS: 20.0000 mg | ORAL_TABLET | Freq: Every day | ORAL | Status: DC

## 2015-05-05 NOTE — ED Notes (Signed)
Pt. Left with all belongings and refused wheelchair 

## 2015-05-05 NOTE — Discharge Instructions (Signed)
Panic Attacks °Panic attacks are sudden, short-lived surges of severe anxiety, fear, or discomfort. They may occur for no reason when you are relaxed, when you are anxious, or when you are sleeping. Panic attacks may occur for a number of reasons:  °· Healthy people occasionally have panic attacks in extreme, life-threatening situations, such as war or natural disasters. Normal anxiety is a protective mechanism of the body that helps us react to danger (fight or flight response). °· Panic attacks are often seen with anxiety disorders, such as panic disorder, social anxiety disorder, generalized anxiety disorder, and phobias. Anxiety disorders cause excessive or uncontrollable anxiety. They may interfere with your relationships or other life activities. °· Panic attacks are sometimes seen with other mental illnesses, such as depression and posttraumatic stress disorder. °· Certain medical conditions, prescription medicines, and drugs of abuse can cause panic attacks. °SYMPTOMS  °Panic attacks start suddenly, peak within 20 minutes, and are accompanied by four or more of the following symptoms: °· Pounding heart or fast heart rate (palpitations). °· Sweating. °· Trembling or shaking. °· Shortness of breath or feeling smothered. °· Feeling choked. °· Chest pain or discomfort. °· Nausea or strange feeling in your stomach. °· Dizziness, light-headedness, or feeling like you will faint. °· Chills or hot flushes. °· Numbness or tingling in your lips or hands and feet. °· Feeling that things are not real or feeling that you are not yourself. °· Fear of losing control or going crazy. °· Fear of dying. °Some of these symptoms can mimic serious medical conditions. For example, you may think you are having a heart attack. Although panic attacks can be very scary, they are not life threatening. °DIAGNOSIS  °Panic attacks are diagnosed through an assessment by your health care provider. Your health care provider will ask  questions about your symptoms, such as where and when they occurred. Your health care provider will also ask about your medical history and use of alcohol and drugs, including prescription medicines. Your health care provider may order blood tests or other studies to rule out a serious medical condition. Your health care provider may refer you to a mental health professional for further evaluation. °TREATMENT  °· Most healthy people who have one or two panic attacks in an extreme, life-threatening situation will not require treatment. °· The treatment for panic attacks associated with anxiety disorders or other mental illness typically involves counseling with a mental health professional, medicine, or a combination of both. Your health care provider will help determine what treatment is best for you. °· Panic attacks due to physical illness usually go away with treatment of the illness. If prescription medicine is causing panic attacks, talk with your health care provider about stopping the medicine, decreasing the dose, or substituting another medicine. °· Panic attacks due to alcohol or drug abuse go away with abstinence. Some adults need professional help in order to stop drinking or using drugs. °HOME CARE INSTRUCTIONS  °· Take all medicines as directed by your health care provider.   °· Schedule and attend follow-up visits as directed by your health care provider. It is important to keep all your appointments. °SEEK MEDICAL CARE IF: °· You are not able to take your medicines as prescribed. °· Your symptoms do not improve or get worse. °SEEK IMMEDIATE MEDICAL CARE IF:  °· You experience panic attack symptoms that are different than your usual symptoms. °· You have serious thoughts about hurting yourself or others. °· You are taking medicine for panic attacks and   have a serious side effect. °MAKE SURE YOU: °· Understand these instructions. °· Will watch your condition. °· Will get help right away if you are not  doing well or get worse. °Document Released: 12/05/2005 Document Revised: 12/10/2013 Document Reviewed: 07/19/2013 °ExitCare® Patient Information ©2015 [employer]. This information is not intended to replace advice given to you by your health care provider. Make sure you discuss any questions you have with your health care provider. ° °Emergency Department Resource Guide °1) Find a Doctor and Pay Out of Pocket °Although you won't have to find out who is covered by your insurance plan, it is a good idea to ask around and get recommendations. You will then need to call the office and see if the doctor you have chosen will accept you as a new patient and what types of options they offer for patients who are self-pay. Some doctors offer discounts or will set up payment plans for their patients who do not have insurance, but you will need to ask so you aren't surprised when you get to your appointment. ° °2) Contact Your Local Health Department °Not all health departments have doctors that can see patients for sick visits, but many do, so it is worth a call to see if yours does. If you don't know where your local health department is, you can check in your phone book. The CDC also has a tool to help you locate your state's health department, and many state websites also have listings of all of their local health departments. ° °3) Find a Walk-in Clinic °If your illness is not likely to be very severe or complicated, you may want to try a walk in clinic. These are popping up all over the country in pharmacies, drugstores, and shopping centers. They're usually staffed by nurse practitioners or physician assistants that have been trained to treat common illnesses and complaints. They're usually fairly quick and inexpensive. However, if you have serious medical issues or chronic medical problems, these are probably not your best option. ° °No Primary Care Doctor: °- Call [facility] at  832-8000 - they can help you  locate a primary care doctor that  accepts your insurance, provides certain services, etc. °- Physician Referral Service- 1-800-533-3463 ° °Chronic Pain Problems: °Organization         Address  Phone   Notes  °[facility]  (336) 297-2271 Patients need to be referred by their primary care doctor.  ° °Medication Assistance: °Organization         Address  Phone   Notes  °[place] [facility] [address] °[place], [place] [address] (336) 641-8030 --Must be a resident of [place] °-- Must have NO insurance coverage whatsoever (no Medicaid/ Medicare, etc.) °-- The pt. MUST have a primary care doctor that directs their care regularly and follows them in the community °  °[facility]  (866) 331-1348   °[employer]  (888) 892-1162   ° °Agencies that provide inexpensive medical care: °Organization         Address  Phone   Notes  °[facility]  (336) 832-8035   °[facility]    (336) 832-7272   °[facility] [address] °[place], [place] [address] (336) 832-4777   °[facility] [address], °[place] (336) 271-4999   °[facility]    (336) 373-0678   °[facility]    (336) 272-1050   °  [facility] ° [address], [place] Phone:  (336) 832-4444, Fax:  (336) 832-4440 Hours of Operation:  9 am - 6 pm, M-F.  Also accepts Medicaid/Medicare and self-pay.  °[facility] ° [address], [place] Phone: (336) [phone], Fax: (336) [phone]. Hours of Operation:  8:30 am - 5:30 pm, M-F.  Also accepts Medicaid and self-pay.  °[facility] [place] [address], [place] Phone: (336) 878-6027   °[facility] [address], [place], [place] (336)723-1848, Ext. 123 Mondays & Thursdays: 7-9 AM.  First 15 patients are seen on a first come, first serve basis. °  ° °Medicaid-accepting [place]  Providers: ° °Organization         Address  Phone   Notes  °[facility] [address], [place] (336) 641-2100 Also accepts self-pay patients.  °[facility] [address], [place] ° (336) 856-9996   °[facility] [address], [place] (336) 288-8857   °[facility] [address] [place] [address], [place] (336) 299-7000   °Veita Bland [address], [place]  ° (336) 373-1557 Only accepts [facility] Access Medicaid patients after they have their name applied to their card.  ° °Self-Pay (no insurance) in [place]: ° °Organization         Address  Phone   Notes  °Sickle Cell Patients, [facility] [address], [place] (336) 832-1970   °[facility] [address], [place] (336) 832-4400   °[facility] ° [address], [place] (336) 992-4800   °[facility]/Dr. Osei-Bonsu ° [address] [place] [address], [place] or [address], [place] (336) 841-8500 Phone number for both [place] and [place] locations is the same.  °[facility] [address], [place] (336) 299-0000   °[facility] [address] [place] [address], [place] or [address] (336) 852-7530 °(336) 878-2260   °[facility] [address], [place] (336) 350-1642, phone; (336) 294-5005, fax Sees patients 1st and 3rd Saturday of every month.  Must not qualify for public or private insurance (i.e. Medicaid, Medicare, [place] Health Choice, Veterans' Benefits) • Household income should be no more than 200% of the poverty level •The clinic cannot treat you if you are pregnant or think you are pregnant • Sexually transmitted diseases are not treated at the clinic.  ° ° °Dental Care: °Organization         Address  Phone  Notes  °[facility] [address],  (336) 641-6152 Accepts children up to age 21 who are enrolled in Medicaid or [place] Health Choice; pregnant women with a Medicaid card; and children who have applied for Medicaid or [place] Health Choice, but were declined, whose parents can pay a reduced fee at time of service.  °[facility] [place]  [address], [place] (336) 641-7733 Accepts children up to age 21 who are enrolled in Medicaid or [place] Health Choice; pregnant women with a Medicaid card; and children who have applied for Medicaid or [place] Health Choice, but were declined, whose parents can pay a reduced fee at time of service.  °[facility] ° [address], [place] (336) 641-4533 Patients are seen by appointment only. Walk-ins are not accepted. [facility] will see patients 18 years of age and older. °Monday - Tuesday (8am-5pm) °Most Wednesdays (8:30-5pm) °$30 per visit, cash only  °[facility] ° [address], [place] (336) 641-4533 Patients are seen by appointment only. Walk-ins are not accepted. [facility] will see patients 18 years of age and older. °One Wednesday Evening (Monthly: Volunteer Based).  $30 per visit, cash only  °[facility]  (919) 537-3737 for adults; Children under age 4, call Graduate Pediatric Dentistry at ([phone]) [phone]. Children aged 4-14, please call (919) [phone] to request a pediatric application. ° Dental services are provided in all areas of dental care including fillings, crowns and bridges, complete and partial dentures, implants, gum treatment, root canals, and extractions. Preventive care is also provided. Treatment is provided to both adults and children. °Patients are selected via a lottery and there is often a waiting list. °  °[facility] [address], °[place] ° (336) 763-8833 [email or web address] °  °[facility]  [address], [place], [place] (336)723-1848, Ext. 123 Second and Fourth Thursday of each month, opens at 6:30 AM; Clinic ends at 9 AM.  Patients are seen on a first-come first-served basis, and a limited number are seen during each clinic.  ° °[facility] ° [address], [place], [place] (336) 723-7904   Eligibility Requirements °You must have lived in [place], [place], or [place] counties for at least the last three months. °  You cannot be eligible for state or federal sponsored healthcare insurance, including Veterans Administration, Medicaid, or Medicare. °  You generally cannot be eligible for healthcare insurance through your employer.  °  How to apply: °Eligibility screenings are held every Tuesday and Wednesday afternoon from 1:00 pm until 4:00 pm. You do not need an appointment for the interview!  °[facility] [address], [place], [place] 336-631-2330   °[facility]  336-342-8273   °[facility]  336-703-3100   °[facility]  336-570-6415   ° °Behavioral Health Resources in the Community: °Intensive Outpatient Programs °Organization         Address  Phone  Notes  °[facility] [address], [place], [place] 336-878-6098   °[facility] [address], [place], [place] 336-832-9800   °[facility] [address], [place], [place] ° 336-882-2125   °[facility] [address],  °[place], [place] 1-800-853-5163 or 336-641-4981   °Substance Abuse Resources °Organization         Address  Phone  Notes  °[facility]  336-882-2125   °[facility]  336-784-9470   °[facility]  336-285-9073   °[facility]  336-845-3988   °Residential & Outpatient Substance Abuse Program  1-800-659-3381   °Psychological Services °Organization         Address  Phone  Notes  °[facility]  336- 832-9600     °[facility]  336- 378-7881   °[facility] [address], [place] 1-800-853-5163 or 336-641-4981   ° °Mobile Crisis Teams °Organization         Address  Phone  Notes  °[facility], Mobile Crisis Care Unit  1-877-626-1772   °Assertive °[facility] ° [address] [place], [place] 336-834-9664   °  Sharon DeEsch [address] °[place] 336-554-5454   ° °Self-Help/Support Groups °Organization         Address  Phone             Notes  °[facility] - variety of support groups  336- 373-1402 Call for more information  °Narcotics Anonymous (NA), [facility] [address], °[place]  2 meetings at this location  ° °Residential Treatment Programs °Organization         Address  Phone  Notes  °[facility] [address],    °[place]  1-866-801-8205   °[facility] ° [address], [place], [place] 704-293-8524   °[facility] [address], [place] 336-845-3988 Admissions: 8am-3pm M-F  °[facility] [address]    °[place], [place] 336-841-1104   °[facility] [address], [place], [place] 336-379-7146   °[facility] [address]  °[place], [place] 336-285-9073   °[facility] - Intensive Outpatient [address], [place], [place] 336-852-3033   °[facility] [address]  °[place], [place] 1-877-615-2722 or 336-784-9470   °[facility] [address] [place], [place] 336-227-7417 Accepts Medicaid  °[facility] [address]  ° [place] 1-800-659-3381 Substance Abuse/Addiction Treatment  ° °[place] Behavioral Health Resources °Organization         Address  Phone  Notes  °[facility]  (888) 581-9988   °Julie Brannon, PhD [address] [place], [place]   (336) 349-5553 or (336) 951-0000    °[facility]   [address] °[place], [place] (336) 349-4454   °[facility] [address], [place], [place] (336) 342-8316 Insurance/Medicaid/sponsorship through [facility]  °[facility] [address]                                    [place], [place] (336) 342-8316 Therapy/tele-psych/case  °[facility] [address]  ° [place],  (336) 349-2233    °Dr. Arfeen  (336) 349-4544   °[facility] [place]  [employer] [place] [facility] 1) [address], [place] °2) [address], [place] °3)  [address], [place] (336) 349-3220 °(336) 342-7768 ° °(336) 342-8140   °[place] [facility] (336) 342-1394 or (336) 342-3537 (After Hours)    ° ° ° °

## 2015-05-05 NOTE — ED Notes (Signed)
TTS in progress 
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
## 2015-05-24 ENCOUNTER — Encounter (HOSPITAL_COMMUNITY): Payer: Self-pay | Admitting: Emergency Medicine

## 2015-05-24 ENCOUNTER — Emergency Department (HOSPITAL_COMMUNITY)
Admission: EM | Admit: 2015-05-24 | Discharge: 2015-05-25 | Disposition: A | Discharge status: Home / Self Care | Payer: Self-pay | Attending: Emergency Medicine | Admitting: Emergency Medicine

## 2015-05-24 DIAGNOSIS — Z87438 Personal history of other diseases of male genital organs: Secondary | ICD-10-CM | POA: Insufficient documentation

## 2015-05-24 DIAGNOSIS — F1210 Cannabis abuse, uncomplicated: Secondary | ICD-10-CM | POA: Insufficient documentation

## 2015-05-24 DIAGNOSIS — Z8669 Personal history of other diseases of the nervous system and sense organs: Secondary | ICD-10-CM | POA: Insufficient documentation

## 2015-05-24 DIAGNOSIS — Z79899 Other long term (current) drug therapy: Secondary | ICD-10-CM | POA: Insufficient documentation

## 2015-05-24 DIAGNOSIS — Z720 Tobacco use: Secondary | ICD-10-CM | POA: Insufficient documentation

## 2015-05-24 DIAGNOSIS — F419 Anxiety disorder, unspecified: Secondary | ICD-10-CM | POA: Insufficient documentation

## 2015-05-24 DIAGNOSIS — Z880 Allergy status to penicillin: Secondary | ICD-10-CM | POA: Insufficient documentation

## 2015-05-24 DIAGNOSIS — I10 Essential (primary) hypertension: Secondary | ICD-10-CM | POA: Insufficient documentation

## 2015-05-24 LAB — COMPREHENSIVE METABOLIC PANEL
ALBUMIN: 3.9 g/dL (ref 3.5–5.0)
ALT: 18 U/L (ref 17–63)
AST: 18 U/L (ref 15–41)
Alkaline Phosphatase: 81 U/L (ref 38–126)
Anion gap: 9 (ref 5–15)
BUN: 21 mg/dL — AB (ref 6–20)
CO2: 26 mmol/L (ref 22–32)
Calcium: 9.4 mg/dL (ref 8.9–10.3)
Chloride: 106 mmol/L (ref 101–111)
Creatinine, Ser: 0.99 mg/dL (ref 0.61–1.24)
GFR calc non Af Amer: >60 mL/min (ref >60)
Glucose, Bld: 151 mg/dL — ABNORMAL HIGH (ref 65–99)
Potassium: 3.7 mmol/L (ref 3.5–5.1)
Sodium: 141 mmol/L (ref 135–145)
TOTAL PROTEIN: 7.2 g/dL (ref 6.5–8.1)
Total Bilirubin: 0.2 mg/dL — ABNORMAL LOW (ref 0.3–1.2)

## 2015-05-24 LAB — CBC
HEMATOCRIT: 41.7 % (ref 39.0–52.0)
HEMOGLOBIN: 13.6 g/dL (ref 13.0–17.0)
MCH: 29.8 pg (ref 26.0–34.0)
MCHC: 32.6 g/dL (ref 30.0–36.0)
MCV: 91.2 fL (ref 78.0–100.0)
Platelets: 265 10*3/uL (ref 150–400)
RBC: 4.57 MIL/uL (ref 4.22–5.81)
RDW: 14 % (ref 11.5–15.5)
WBC: 7.5 10*3/uL (ref 4.0–10.5)

## 2015-05-24 LAB — RAPID URINE DRUG SCREEN, HOSP PERFORMED
Amphetamines: NOT DETECTED
BENZODIAZEPINES: NOT DETECTED
Barbiturates: NOT DETECTED
Cocaine: NOT DETECTED
OPIATES: NOT DETECTED
Tetrahydrocannabinol: POSITIVE — AB

## 2015-05-24 LAB — ETHANOL: Alcohol, Ethyl (B): <5 mg/dL (ref <5)

## 2015-05-24 LAB — SALICYLATE LEVEL: Salicylate Lvl: <4 mg/dL (ref 2.8–30.0)

## 2015-05-24 LAB — ACETAMINOPHEN LEVEL: Acetaminophen (Tylenol), Serum: <10 ug/mL — ABNORMAL LOW (ref 10–30)

## 2015-05-24 NOTE — ED Notes (Addendum)
Patient with high anxiety levels, stress levels.  Patient states he is homeless and was at a homeless shelter, the noise was too much to take while he was there.  Friend states that he has been depressed lately.  Patient with headache that started earlier tonight.  Patient denies any SI or HI at this time.

## 2015-05-25 NOTE — ED Provider Notes (Signed)
CSN: [id number]     Arrival date & time 05/24/15  2005 History   First MD Initiated Contact with Patient 05/24/15 2344     Chief Complaint  Patient presents with  . Headache  . Anxiety     (Consider location/radiation/quality/duration/timing/severity/associated sxs/prior Treatment) HPI Comments: Should states he has anxiety disorder and at dinner tonight at the homeless shelter they were playing music that was way too loud.  When he asked that it be turned down.  He was refused.  This increased his anxiety.  He felt that his only option was to come to the emergency department because that way he can go back to the shelter later tonight.  He is not suicidal or homicidal. He states he is waiting for his disability to be approved for his depression and anxiety. He also states he has a mild headache.  He has not taken any medication for his symptoms  Patient is a 54 y.o. male presenting with headaches and anxiety. The history is provided by the patient.  Headache Pain location:  Generalized Radiates to:  Does not radiate Severity currently:  4/10 Timing:  Constant Chronicity:  Recurrent Similar to prior headaches: yes   Relieved by:  None tried Exacerbated by: noise. Associated symptoms: no dizziness, no fever, no nausea and no vomiting   Anxiety Associated symptoms include headaches. Pertinent negatives include no chills, fever, nausea or vomiting.    Past Medical History  Diagnosis Date  . Hypertension   . Prostate disorder   . Anxiety   . Asperger syndrome   . H/O suicide attempt     overdose "many years ago"  . Restless leg syndrome    History reviewed. No pertinent past surgical history. No family history on file. History  Substance Use Topics  . Smoking status: Current Some Day Smoker  . Smokeless tobacco: Never Used  . Alcohol Use: No    Review of Systems  Constitutional: Negative for fever and chills.  Gastrointestinal: Negative for nausea and vomiting.   Neurological: Positive for headaches. Negative for dizziness.  Psychiatric/Behavioral: The patient is nervous/anxious.   All other systems reviewed and are negative.     Allergies  Amoxicillin; Cephalexin; and Codeine  Home Medications   Prior to Admission medications   Medication Sig Start Date End Date Taking? Authorizing Provider  amLODipine (NORVASC) 5 MG tablet Take 10 mg by mouth daily before supper.    Historical Provider, MD  clonazePAM (KLONOPIN) 0.5 MG tablet Take 1 tablet (0.5 mg total) by mouth at bedtime as needed for anxiety. 02/01/15   Richardean Canal, MD  FLUoxetine (PROZAC) 20 MG capsule Take 60 mg by mouth daily before supper.     Historical Provider, MD  gabapentin (NEURONTIN) 300 MG capsule Take 300 mg by mouth 2 (two) times daily. One in am   One at 1200   2 at night    Historical Provider, MD  hydrOXYzine (ATARAX/VISTARIL) 25 MG tablet Take 50 mg by mouth daily before supper.    Historical Provider, MD  lisinopril (PRINIVIL,ZESTRIL) 20 MG tablet Take 1 tablet (20 mg total) by mouth daily. Patient taking differently: Take 20 mg by mouth daily before supper.  01/18/15   Zadie Rhine, MD  PRESCRIPTION MEDICATION Clonidine patch    Historical Provider, MD  propranolol (INDERAL) 40 MG tablet Take 0.5 tablets (20 mg total) by mouth daily. 05/05/15   Antony Madura, PA-C  QUEtiapine Fumarate (SEROQUEL PO) Take 1 tablet by mouth at bedtime. Unknown strength  Historical Provider, MD  tamsulosin (FLOMAX) 0.4 MG CAPS capsule Take 1 capsule (0.4 mg total) by mouth daily after supper. Patient taking differently: Take 0.4 mg by mouth daily before supper.  12/20/13   Christiane Haorinna L Sullivan, MD   BP 121/82 mmHg  Pulse 67  Temp(Src) 98.6 F (37 C) (Oral)  Resp 16  Ht 5\' 8"  (1.727 m)  Wt 167 lb 5 oz (75.892 kg)  BMI 25.45 kg/m2  SpO2 99% Physical Exam  Constitutional: He is oriented to person, place, and time. He appears well-developed and well-nourished.  HENT:  Head:  Normocephalic.  Eyes: Pupils are equal, round, and reactive to light.  Neck: Normal range of motion.  Cardiovascular: Normal rate and regular rhythm.   Pulmonary/Chest: Effort normal.  Musculoskeletal: Normal range of motion.  Neurological: He is alert and oriented to person, place, and time.  Skin: Skin is warm.  Psychiatric: His speech is normal and behavior is normal. Thought content normal. His mood appears anxious. Cognition and memory are normal. He expresses inappropriate judgment.  Nursing note and vitals reviewed.   ED Course  Procedures (including critical care time) Labs Review Labs Reviewed  ACETAMINOPHEN LEVEL - Abnormal; Notable for the following:    Acetaminophen (Tylenol), Serum <10 (*)    All other components within normal limits  COMPREHENSIVE METABOLIC PANEL - Abnormal; Notable for the following:    Glucose, Bld 151 (*)    BUN 21 (*)    Total Bilirubin 0.2 (*)    All other components within normal limits  URINE RAPID DRUG SCREEN (HOSP PERFORMED) NOT AT [facility] - Abnormal; Notable for the following:    Tetrahydrocannabinol POSITIVE (*)    All other components within normal limits  CBC  ETHANOL  SALICYLATE LEVEL    Imaging Review No results found.   EKG Interpretation None      MDM   Final diagnoses:  None         Earley FavorGail Paublo Warshawsky, NP 05/25/15 [id number]  Tomasita CrumbleAdeleke Oni, MD 05/25/15 [id number]

## 2015-05-25 NOTE — Discharge Instructions (Signed)
Panic Attacks °Panic attacks are sudden, short feelings of great fear or discomfort. You may have them for no reason when you are relaxed, when you are uneasy (anxious), or when you are sleeping.  °HOME CARE °· Take all your medicines as told. °· Check with your doctor before starting new medicines. °· Keep all doctor visits. °GET HELP IF: °· You are not able to take your medicines as told. °· Your symptoms do not get better. °· Your symptoms get worse. °GET HELP RIGHT AWAY IF: °· Your attacks seem different than your normal attacks. °· You have thoughts about hurting yourself or others. °· You take panic attack medicine and you have a side effect. °MAKE SURE YOU: °· Understand these instructions. °· Will watch your condition. °· Will get help right away if you are not doing well or get worse. °Document Released: 01/07/2011 Document Revised: 09/25/2013 Document Reviewed: 07/19/2013 °ExitCare® Patient Information ©2015 [employer]. This information is not intended to replace advice given to you by your health care provider. Make sure you discuss any questions you have with your health care provider. ° °

## 2015-06-16 ENCOUNTER — Encounter (HOSPITAL_COMMUNITY): Payer: Self-pay | Admitting: Emergency Medicine

## 2015-06-16 DIAGNOSIS — Z87438 Personal history of other diseases of male genital organs: Secondary | ICD-10-CM | POA: Insufficient documentation

## 2015-06-16 DIAGNOSIS — Z720 Tobacco use: Secondary | ICD-10-CM | POA: Insufficient documentation

## 2015-06-16 DIAGNOSIS — Z880 Allergy status to penicillin: Secondary | ICD-10-CM | POA: Insufficient documentation

## 2015-06-16 DIAGNOSIS — Z8669 Personal history of other diseases of the nervous system and sense organs: Secondary | ICD-10-CM | POA: Insufficient documentation

## 2015-06-16 DIAGNOSIS — F419 Anxiety disorder, unspecified: Secondary | ICD-10-CM | POA: Insufficient documentation

## 2015-06-16 DIAGNOSIS — Z79899 Other long term (current) drug therapy: Secondary | ICD-10-CM | POA: Insufficient documentation

## 2015-06-16 DIAGNOSIS — I10 Essential (primary) hypertension: Secondary | ICD-10-CM | POA: Insufficient documentation

## 2015-06-16 NOTE — ED Notes (Signed)
Pt. arrived with EMS from a homeless shelter reports anxiety attack this evening , denies hallucinations or suicidal ideations .

## 2015-06-17 ENCOUNTER — Emergency Department (HOSPITAL_COMMUNITY)
Admission: EM | Admit: 2015-06-17 | Discharge: 2015-06-17 | Disposition: A | Discharge status: Home / Self Care | Payer: Self-pay | Attending: Emergency Medicine | Admitting: Emergency Medicine

## 2015-06-17 DIAGNOSIS — F419 Anxiety disorder, unspecified: Secondary | ICD-10-CM

## 2015-06-17 MED ORDER — LORAZEPAM 1 MG PO TABS: 1.0000 mg | ORAL_TABLET | Freq: Four times a day (QID) | ORAL | Status: DC | PRN

## 2015-06-17 NOTE — ED Provider Notes (Signed)
CSN: [id number]     Arrival date & time 06/16/15  2327 History  This chart was scribed for Dione Boozeavid Chee Kinslow, MD by Evon Slackerrance Branch, ED Scribe. This patient was seen in room A12C/A12C and the patient's care was started at 3:37 AM.      Chief Complaint  Patient presents with  . Anxiety    Patient is a 54 y.o. male presenting with anxiety. The history is provided by the patient. No language interpreter was used.  Anxiety Pertinent negatives include no chest pain and no shortness of breath.   HPI Comments: Zachary Russo is a 54 y.o. male brought in by ambulance, who presents to the Emergency Department complaining of Anxiety attack onset PTA. Pt states that he had associated palpitations described as tachycardia. Pt states that after getting some rest his anxiety has resolved. Pt doesn't report any medications PTA. Denies SOB, nausea or CP. Pt states that he is an occasional smoker.   Past Medical History  Diagnosis Date  . Hypertension   . Prostate disorder   . Anxiety   . Asperger syndrome   . H/O suicide attempt     overdose "many years ago"  . Restless leg syndrome    History reviewed. No pertinent past surgical history. No family history on file. History  Substance Use Topics  . Smoking status: Current Some Day Smoker  . Smokeless tobacco: Never Used  . Alcohol Use: No    Review of Systems  Respiratory: Negative for shortness of breath.   Cardiovascular: Negative for chest pain.  Gastrointestinal: Negative for nausea.  Psychiatric/Behavioral: The patient is nervous/anxious.   All other systems reviewed and are negative.    Allergies  Amoxicillin; Cephalexin; and Codeine  Home Medications   Prior to Admission medications   Medication Sig Start Date End Date Taking? Authorizing Provider  amLODipine (NORVASC) 5 MG tablet Take 10 mg by mouth daily before supper.    Historical Provider, MD  clonazePAM (KLONOPIN) 0.5 MG tablet Take 1 tablet (0.5 mg total) by mouth at bedtime as  needed for anxiety. 02/01/15   Richardean Canalavid H Yao, MD  FLUoxetine (PROZAC) 20 MG capsule Take 60 mg by mouth daily before supper.     Historical Provider, MD  gabapentin (NEURONTIN) 300 MG capsule Take 300 mg by mouth 2 (two) times daily. One in am   One at 1200   2 at night    Historical Provider, MD  hydrOXYzine (ATARAX/VISTARIL) 25 MG tablet Take 50 mg by mouth daily before supper.    Historical Provider, MD  lisinopril (PRINIVIL,ZESTRIL) 20 MG tablet Take 1 tablet (20 mg total) by mouth daily. Patient taking differently: Take 20 mg by mouth daily before supper.  01/18/15   Zadie Rhineonald Wickline, MD  PRESCRIPTION MEDICATION Clonidine patch    Historical Provider, MD  propranolol (INDERAL) 40 MG tablet Take 0.5 tablets (20 mg total) by mouth daily. 05/05/15   Antony MaduraKelly Humes, PA-C  QUEtiapine Fumarate (SEROQUEL PO) Take 1 tablet by mouth at bedtime. Unknown strength    Historical Provider, MD  tamsulosin (FLOMAX) 0.4 MG CAPS capsule Take 1 capsule (0.4 mg total) by mouth daily after supper. Patient taking differently: Take 0.4 mg by mouth daily before supper.  12/20/13   Christiane Haorinna L Sullivan, MD   BP 130/94 mmHg  Pulse 56  Temp(Src) 97.6 F (36.4 C) (Oral)  Resp 20  SpO2 97%   Physical Exam  Constitutional: He is oriented to person, place, and time. He appears well-developed and well-nourished. No  distress.  HENT:  Head: Normocephalic and atraumatic.  Eyes: Conjunctivae and EOM are normal. Pupils are equal, round, and reactive to light.  Neck: Normal range of motion. Neck supple. No JVD present.  Cardiovascular: Normal rate, regular rhythm and normal heart sounds.   No murmur heard. Pulmonary/Chest: Effort normal and breath sounds normal. He has no wheezes. He has no rales. He exhibits no tenderness.  Abdominal: Soft. Bowel sounds are normal. He exhibits no distension and no mass. There is no tenderness.  Musculoskeletal: Normal range of motion. He exhibits no edema.  Lymphadenopathy:    He has no cervical  adenopathy.  Neurological: He is alert and oriented to person, place, and time. No cranial nerve deficit. Coordination normal.  Skin: Skin is warm and dry. No rash noted.  Psychiatric: He has a normal mood and affect. His behavior is normal. Judgment and thought content normal.  Nursing note and vitals reviewed.   ED Course  Procedures (including critical care time) DIAGNOSTIC STUDIES: Oxygen Saturation is 97% on RA, normal by my interpretation.    COORDINATION OF CARE: 3:42 AM-Discussed treatment plan with pt at bedside and pt agreed to plan.     MDM   Final diagnoses:  Anxiety      Transient episode of anxiety. Patient was actually sleeping when I entered the room. He states he no longer as anxious. Old records are reviewed and he has several ED visits with similar complaints. He is given a prescription for a small number of lorazepam tablets to use as needed when he has transient episode of anxiety. This is an alternative so that he does not have to return to the ED every time he has an anxiety attack.   I personally performed the services described in this documentation, which was scribed in my presence. The recorded information has been reviewed and is accurate.       Dione Booze, MD 06/17/15 [phone]

## 2015-06-17 NOTE — Discharge Instructions (Signed)
Panic Attacks °Panic attacks are sudden, short-lived surges of severe anxiety, fear, or discomfort. They may occur for no reason when you are relaxed, when you are anxious, or when you are sleeping. Panic attacks may occur for a number of reasons:  °· Healthy people occasionally have panic attacks in extreme, life-threatening situations, such as war or natural disasters. Normal anxiety is a protective mechanism of the body that helps us react to danger (fight or flight response). °· Panic attacks are often seen with anxiety disorders, such as panic disorder, social anxiety disorder, generalized anxiety disorder, and phobias. Anxiety disorders cause excessive or uncontrollable anxiety. They may interfere with your relationships or other life activities. °· Panic attacks are sometimes seen with other mental illnesses, such as depression and posttraumatic stress disorder. °· Certain medical conditions, prescription medicines, and drugs of abuse can cause panic attacks. °SYMPTOMS  °Panic attacks start suddenly, peak within 20 minutes, and are accompanied by four or more of the following symptoms: °· Pounding heart or fast heart rate (palpitations). °· Sweating. °· Trembling or shaking. °· Shortness of breath or feeling smothered. °· Feeling choked. °· Chest pain or discomfort. °· Nausea or strange feeling in your stomach. °· Dizziness, light-headedness, or feeling like you will faint. °· Chills or hot flushes. °· Numbness or tingling in your lips or hands and feet. °· Feeling that things are not real or feeling that you are not yourself. °· Fear of losing control or going crazy. °· Fear of dying. °Some of these symptoms can mimic serious medical conditions. For example, you may think you are having a heart attack. Although panic attacks can be very scary, they are not life threatening. °DIAGNOSIS  °Panic attacks are diagnosed through an assessment by your health care provider. Your health care provider will ask  questions about your symptoms, such as where and when they occurred. Your health care provider will also ask about your medical history and use of alcohol and drugs, including prescription medicines. Your health care provider may order blood tests or other studies to rule out a serious medical condition. Your health care provider may refer you to a mental health professional for further evaluation. °TREATMENT  °· Most healthy people who have one or two panic attacks in an extreme, life-threatening situation will not require treatment. °· The treatment for panic attacks associated with anxiety disorders or other mental illness typically involves counseling with a mental health professional, medicine, or a combination of both. Your health care provider will help determine what treatment is best for you. °· Panic attacks due to physical illness usually go away with treatment of the illness. If prescription medicine is causing panic attacks, talk with your health care provider about stopping the medicine, decreasing the dose, or substituting another medicine. °· Panic attacks due to alcohol or drug abuse go away with abstinence. Some adults need professional help in order to stop drinking or using drugs. °HOME CARE INSTRUCTIONS  °· Take all medicines as directed by your health care provider.   °· Schedule and attend follow-up visits as directed by your health care provider. It is important to keep all your appointments. °SEEK MEDICAL CARE IF: °· You are not able to take your medicines as prescribed. °· Your symptoms do not improve or get worse. °SEEK IMMEDIATE MEDICAL CARE IF:  °· You experience panic attack symptoms that are different than your usual symptoms. °· You have serious thoughts about hurting yourself or others. °· You are taking medicine for panic attacks and   have a serious side effect. MAKE SURE YOU:  Understand these instructions.  Will watch your condition.  Will get help right away if you are not  doing well or get worse. Document Released: 12/05/2005 Document Revised: 12/10/2013 Document Reviewed: 07/19/2013 [facility] Patient Information 2015 [place], [place]. This information is not intended to replace advice given to you by your health care provider. Make sure you discuss any questions you have with your health care provider.  Lorazepam tablets What is this medicine? LORAZEPAM (lor A ze pam) is a benzodiazepine. It is used to treat anxiety. This medicine may be used for other purposes; ask your health care provider or pharmacist if you have questions. COMMON BRAND NAME(S): Ativan What should I tell my health care provider before I take this medicine? They need to know if you have any of these conditions: -alcohol or drug abuse problem -bipolar disorder, depression, psychosis or other mental health condition -glaucoma -kidney or liver disease -lung disease or breathing difficulties -myasthenia gravis -Parkinson's disease -seizures or a history of seizures -suicidal thoughts -an unusual or allergic reaction to lorazepam, other benzodiazepines, foods, dyes, or preservatives -pregnant or trying to get pregnant -breast-feeding How should I use this medicine? Take this medicine by mouth with a glass of water. Follow the directions on the prescription label. If it upsets your stomach, take it with food or milk. Take your medicine at regular intervals. Do not take it more often than directed. Do not stop taking except on the advice of your doctor or health care professional. Talk to your pediatrician regarding the use of this medicine in children. Special care may be needed. Overdosage: If you think you have taken too much of this medicine contact a poison control center or emergency room at once. NOTE: This medicine is only for you. Do not share this medicine with others. What if I miss a dose? If you miss a dose, take it as soon as you can. If it is almost time for your next dose, take  only that dose. Do not take double or extra doses. What may interact with this medicine? -barbiturate medicines for inducing sleep or treating seizures, like phenobarbital -clozapine -medicines for depression, mental problems or psychiatric disturbances -medicines for sleep -phenytoin -probenecid -theophylline -valproic acid This list may not describe all possible interactions. Give your health care provider a list of all the medicines, herbs, non-prescription drugs, or dietary supplements you use. Also tell them if you smoke, drink alcohol, or use illegal drugs. Some items may interact with your medicine. What should I watch for while using this medicine? Visit your doctor or health care professional for regular checks on your progress. Your body may become dependent on this medicine, ask your doctor or health care professional if you still need to take it. However, if you have been taking this medicine regularly for some time, do not suddenly stop taking it. You must gradually reduce the dose or you may get severe side effects. Ask your doctor or health care professional for advice before increasing or decreasing the dose. Even after you stop taking this medicine it can still affect your body for several days. You may get drowsy or dizzy. Do not drive, use machinery, or do anything that needs mental alertness until you know how this medicine affects you. To reduce the risk of dizzy and fainting spells, do not stand or sit up quickly, especially if you are an older patient. Alcohol may increase dizziness and drowsiness. Avoid alcoholic drinks. Do  not treat yourself for coughs, colds or allergies without asking your doctor or health care professional for advice. Some ingredients can increase possible side effects. What side effects may I notice from receiving this medicine? Side effects that you should report to your doctor or health care professional as soon as possible: -changes in  vision -confusion -depression -mood changes, excitability or aggressive behavior -movement difficulty, staggering or jerky movements -muscle cramps -restlessness -weakness or tiredness Side effects that usually do not require medical attention (report to your doctor or health care professional if they continue or are bothersome): -constipation or diarrhea -difficulty sleeping, nightmares -dizziness, drowsiness -headache -nausea, vomiting This list may not describe all possible side effects. Call your doctor for medical advice about side effects. You may report side effects to FDA at 1-800-FDA-1088. Where should I keep my medicine? Keep out of the reach of children. This medicine can be abused. Keep your medicine in a safe place to protect it from theft. Do not share this medicine with anyone. Selling or giving away this medicine is dangerous and against the law. Store at room temperature between 20 and 25 degrees C (68 and 77 degrees F). Protect from light. Keep container tightly closed. Throw away any unused medicine after the expiration date. NOTE: This sheet is a summary. It may not cover all possible information. If you have questions about this medicine, talk to your doctor, pharmacist, or health care provider.  2015, [employer]vier/[employer] Standard. (2008-06-06 14:58:20)

## 2015-06-17 NOTE — ED Notes (Signed)
Pt left with all belongings and refused wheelchair. 

## 2015-07-07 ENCOUNTER — Encounter (HOSPITAL_COMMUNITY): Payer: Self-pay

## 2015-07-07 ENCOUNTER — Emergency Department (HOSPITAL_COMMUNITY)
Admission: EM | Admit: 2015-07-07 | Discharge: 2015-07-07 | Disposition: A | Discharge status: Other Acute Inpatient Hospital | Payer: Self-pay | Attending: Emergency Medicine | Admitting: Emergency Medicine

## 2015-07-07 ENCOUNTER — Inpatient Hospital Stay (HOSPITAL_COMMUNITY)
Admission: EM | Admit: 2015-07-07 | Discharge: 2015-07-12 | DRG: 885 | Disposition: A | Discharge status: Home / Self Care | Payer: Federal, State, Local not specified - Other | Source: Intra-hospital | Attending: Psychiatry | Admitting: Psychiatry

## 2015-07-07 ENCOUNTER — Encounter (HOSPITAL_COMMUNITY): Payer: Self-pay | Admitting: *Deleted

## 2015-07-07 DIAGNOSIS — N400 Enlarged prostate without lower urinary tract symptoms: Secondary | ICD-10-CM | POA: Diagnosis present

## 2015-07-07 DIAGNOSIS — R45851 Suicidal ideations: Secondary | ICD-10-CM | POA: Diagnosis present

## 2015-07-07 DIAGNOSIS — Z79899 Other long term (current) drug therapy: Secondary | ICD-10-CM | POA: Insufficient documentation

## 2015-07-07 DIAGNOSIS — Z720 Tobacco use: Secondary | ICD-10-CM | POA: Insufficient documentation

## 2015-07-07 DIAGNOSIS — G4700 Insomnia, unspecified: Secondary | ICD-10-CM | POA: Diagnosis present

## 2015-07-07 DIAGNOSIS — F329 Major depressive disorder, single episode, unspecified: Secondary | ICD-10-CM | POA: Insufficient documentation

## 2015-07-07 DIAGNOSIS — F419 Anxiety disorder, unspecified: Secondary | ICD-10-CM | POA: Insufficient documentation

## 2015-07-07 DIAGNOSIS — I10 Essential (primary) hypertension: Secondary | ICD-10-CM | POA: Insufficient documentation

## 2015-07-07 DIAGNOSIS — F339 Major depressive disorder, recurrent, unspecified: Principal | ICD-10-CM | POA: Diagnosis present

## 2015-07-07 DIAGNOSIS — F845 Asperger's syndrome: Secondary | ICD-10-CM | POA: Diagnosis present

## 2015-07-07 DIAGNOSIS — Z590 Homelessness: Secondary | ICD-10-CM | POA: Diagnosis not present

## 2015-07-07 DIAGNOSIS — F332 Major depressive disorder, recurrent severe without psychotic features: Secondary | ICD-10-CM | POA: Diagnosis not present

## 2015-07-07 DIAGNOSIS — F17290 Nicotine dependence, other tobacco product, uncomplicated: Secondary | ICD-10-CM | POA: Diagnosis present

## 2015-07-07 DIAGNOSIS — Z880 Allergy status to penicillin: Secondary | ICD-10-CM | POA: Insufficient documentation

## 2015-07-07 DIAGNOSIS — G2581 Restless legs syndrome: Secondary | ICD-10-CM | POA: Insufficient documentation

## 2015-07-07 DIAGNOSIS — Z915 Personal history of self-harm: Secondary | ICD-10-CM | POA: Insufficient documentation

## 2015-07-07 DIAGNOSIS — Z87438 Personal history of other diseases of male genital organs: Secondary | ICD-10-CM | POA: Insufficient documentation

## 2015-07-07 HISTORY — DX: Depression, unspecified: F32.A

## 2015-07-07 HISTORY — DX: Major depressive disorder, single episode, unspecified: F32.9

## 2015-07-07 LAB — COMPREHENSIVE METABOLIC PANEL
ALBUMIN: 3.9 g/dL (ref 3.5–5.0)
ALK PHOS: 74 U/L (ref 38–126)
ALT: 19 U/L (ref 17–63)
ANION GAP: 7 (ref 5–15)
AST: 17 U/L (ref 15–41)
BILIRUBIN TOTAL: 0.4 mg/dL (ref 0.3–1.2)
BUN: 14 mg/dL (ref 6–20)
CALCIUM: 9.2 mg/dL (ref 8.9–10.3)
CO2: 28 mmol/L (ref 22–32)
Chloride: 104 mmol/L (ref 101–111)
Creatinine, Ser: 1.23 mg/dL (ref 0.61–1.24)
GLUCOSE: 82 mg/dL (ref 65–99)
Potassium: 3.9 mmol/L (ref 3.5–5.1)
Sodium: 139 mmol/L (ref 135–145)
Total Protein: 7 g/dL (ref 6.5–8.1)

## 2015-07-07 LAB — CBC
HCT: 41.9 % (ref 39.0–52.0)
Hemoglobin: 14.2 g/dL (ref 13.0–17.0)
MCH: 30.5 pg (ref 26.0–34.0)
MCHC: 33.9 g/dL (ref 30.0–36.0)
MCV: 89.9 fL (ref 78.0–100.0)
Platelets: 199 10*3/uL (ref 150–400)
RBC: 4.66 MIL/uL (ref 4.22–5.81)
RDW: 13.6 % (ref 11.5–15.5)
WBC: 7.8 10*3/uL (ref 4.0–10.5)

## 2015-07-07 LAB — ACETAMINOPHEN LEVEL: Acetaminophen (Tylenol), Serum: <10 ug/mL — ABNORMAL LOW (ref 10–30)

## 2015-07-07 LAB — RAPID URINE DRUG SCREEN, HOSP PERFORMED
Amphetamines: NOT DETECTED
BARBITURATES: NOT DETECTED
Benzodiazepines: POSITIVE — AB
Cocaine: NOT DETECTED
OPIATES: NOT DETECTED
Tetrahydrocannabinol: NOT DETECTED

## 2015-07-07 LAB — SALICYLATE LEVEL

## 2015-07-07 LAB — ETHANOL

## 2015-07-07 MED ORDER — TRAZODONE HCL 50 MG PO TABS
50.0000 mg | ORAL_TABLET | Freq: Every evening | ORAL | Status: DC | PRN
  Filled 2015-07-07: qty 1

## 2015-07-07 MED ORDER — AMLODIPINE BESYLATE 10 MG PO TABS
10.0000 mg | ORAL_TABLET | Freq: Every day | ORAL | Status: DC
  Administered 2015-07-07 – 2015-07-11 (×5): 10 mg via ORAL
  Filled 2015-07-07 (×4): qty 1
  Filled 2015-07-07: qty 14
  Filled 2015-07-07 (×3): qty 1

## 2015-07-07 MED ORDER — MAGNESIUM HYDROXIDE 400 MG/5ML PO SUSP
30.0000 mL | Freq: Every day | ORAL | Status: DC | PRN
Start: 2015-07-07 — End: 2015-07-12

## 2015-07-07 MED ORDER — TAMSULOSIN HCL 0.4 MG PO CAPS
0.4000 mg | ORAL_CAPSULE | Freq: Every day | ORAL | Status: DC
  Administered 2015-07-07 – 2015-07-11 (×5): 0.4 mg via ORAL
  Filled 2015-07-07 (×6): qty 1
  Filled 2015-07-07: qty 14
  Filled 2015-07-07: qty 1

## 2015-07-07 MED ORDER — LISINOPRIL 20 MG PO TABS
20.0000 mg | ORAL_TABLET | Freq: Every day | ORAL | Status: DC
  Administered 2015-07-07 – 2015-07-12 (×6): 20 mg via ORAL
  Filled 2015-07-07: qty 14
  Filled 2015-07-07 (×8): qty 1

## 2015-07-07 MED ORDER — ACETAMINOPHEN 325 MG PO TABS
650.0000 mg | ORAL_TABLET | Freq: Four times a day (QID) | ORAL | Status: DC | PRN
  Administered 2015-07-09 – 2015-07-10 (×2): 650 mg via ORAL
  Filled 2015-07-07 (×2): qty 2

## 2015-07-07 MED ORDER — FLUOXETINE HCL 20 MG PO CAPS
60.0000 mg | ORAL_CAPSULE | Freq: Every day | ORAL | Status: DC
  Administered 2015-07-07 – 2015-07-11 (×5): 60 mg via ORAL
  Filled 2015-07-07 (×5): qty 3
  Filled 2015-07-07: qty 42
  Filled 2015-07-07 (×2): qty 3

## 2015-07-07 MED ORDER — PROPRANOLOL HCL 20 MG PO TABS
20.0000 mg | ORAL_TABLET | Freq: Every day | ORAL | Status: DC
  Administered 2015-07-07 – 2015-07-12 (×6): 20 mg via ORAL
  Filled 2015-07-07 (×2): qty 1
  Filled 2015-07-07: qty 2
  Filled 2015-07-07 (×3): qty 1
  Filled 2015-07-07: qty 14
  Filled 2015-07-07 (×2): qty 1

## 2015-07-07 MED ORDER — ALUM & MAG HYDROXIDE-SIMETH 200-200-20 MG/5ML PO SUSP
30.0000 mL | ORAL | Status: DC | PRN
Start: 2015-07-07 — End: 2015-07-12

## 2015-07-07 MED ORDER — CLONAZEPAM 0.5 MG PO TABS
0.5000 mg | ORAL_TABLET | Freq: Once | ORAL | Status: DC
  Filled 2015-07-07: qty 1

## 2015-07-07 MED ORDER — HYDROXYZINE HCL 50 MG PO TABS
50.0000 mg | ORAL_TABLET | Freq: Every day | ORAL | Status: DC
  Administered 2015-07-07 – 2015-07-08 (×2): 50 mg via ORAL
  Filled 2015-07-07 (×4): qty 1

## 2015-07-07 MED ORDER — NICOTINE 21 MG/24HR TD PT24
21.0000 mg | MEDICATED_PATCH | Freq: Every day | TRANSDERMAL | Status: DC
  Filled 2015-07-07: qty 1

## 2015-07-07 MED ORDER — GABAPENTIN 300 MG PO CAPS
300.0000 mg | ORAL_CAPSULE | Freq: Three times a day (TID) | ORAL | Status: DC
  Administered 2015-07-07 – 2015-07-08 (×4): 300 mg via ORAL
  Filled 2015-07-07 (×9): qty 1

## 2015-07-07 NOTE — ED Notes (Signed)
Patient presents stating he has been feeling lots of depression.  Also states he has talked about suicide and stated he has a plan

## 2015-07-07 NOTE — ED Notes (Signed)
Patient wanded by security. 

## 2015-07-07 NOTE — Progress Notes (Signed)
Adult Psychoeducational Group Note  Date:  07/07/2015 Time:  9:01 PM  Group Topic/Focus:  Wrap-Up Group:   The focus of this group is to help patients review their daily goal of treatment and discuss progress on daily workbooks.  Participation Level: Did not attend.  Modes of Intervention:  Activity  Additional Comments:  Pts played a therapeutic activity of Mental Health Jeopardy. Pt was invited, however stated he was going to try to get some sleep since he did not get much sleep last night.  Caswell CorwinOwen, Vuk Skillern C 07/07/2015, 9:01 PM

## 2015-07-07 NOTE — ED Notes (Signed)
Staffing office called and they have an available sitter and will be sending them.  Patient given scrubs to change in to.

## 2015-07-07 NOTE — Progress Notes (Signed)
Recreation Therapy Notes  Animal-Assisted Activity (AAA) Program Checklist/Progress Notes Patient Eligibility Criteria Checklist & Daily Group note for Rec Tx Intervention  Date: 07.19.16 Time: 2:45 pm [address]   AAA/T Program Assumption of Risk Form signed by Patient/ or Parent Legal Guardian yes  Patient is free of allergies or sever asthma yes  Patient reports no fear of animals yes  Patient reports no history of cruelty to animalsyes  Patient understands his/her participation is voluntary yes  Patient washes hands before animal contact yes  Patient washes hands after animal contact yes  Education: Hand Washing, Appropriate Animal Interaction   Education Outcome: Acknowledges understanding/In group clarification offered  Clinical Observations/Feedback: Patient did not attend group.   Zachary Russo, LRT/CTRS   Lillia AbedLindsay, Girtie Wiersma A 07/07/2015 4:00 PM

## 2015-07-07 NOTE — ED Notes (Signed)
Sitter at bedside.

## 2015-07-07 NOTE — [facility] Group Notes (Signed)
[facility] LCSW Group Therapy 07/07/2015 1:15 PM  Type of Therapy: Group Therapy- Feelings about Diagnosis  Pt did not attend, declined invitation. Pt reports that he would like to sleep since he was not able to the night before.  Chad CordialLauren Carter, LCSWA 07/07/2015 5:15 PM

## 2015-07-07 NOTE — ED Notes (Signed)
Gave pt meal bag and apple juice.

## 2015-07-07 NOTE — ED Provider Notes (Signed)
Pt alert ambulatory, pleasant and cooperative stable [facility] . Accepting MD Dr. Jama Flavorsobos. Results for orders placed or performed during the hospital encounter of 07/07/15  Comprehensive metabolic panel  Result Value Ref Range   Sodium 139 135 - 145 mmol/L   Potassium 3.9 3.5 - 5.1 mmol/L   Chloride 104 101 - 111 mmol/L   CO2 28 22 - 32 mmol/L   Glucose, Bld 82 65 - 99 mg/dL   BUN 14 6 - 20 mg/dL   Creatinine, Ser 4.091.23 0.61 - 1.24 mg/dL   Calcium 9.2 8.9 - 81.110.3 mg/dL   Total Protein 7.0 6.5 - 8.1 g/dL   Albumin 3.9 3.5 - 5.0 g/dL   AST 17 15 - 41 U/L   ALT 19 17 - 63 U/L   Alkaline Phosphatase 74 38 - 126 U/L   Total Bilirubin 0.4 0.3 - 1.2 mg/dL   GFR calc non Af Amer >60 >60 mL/min   GFR calc Af Amer >60 >60 mL/min   Anion gap 7 5 - 15  Ethanol (ETOH)  Result Value Ref Range   Alcohol, Ethyl (B) <5 <5 mg/dL  Salicylate level  Result Value Ref Range   Salicylate Lvl <4.0 2.8 - 30.0 mg/dL  Acetaminophen level  Result Value Ref Range   Acetaminophen (Tylenol), Serum <10 (L) 10 - 30 ug/mL  CBC  Result Value Ref Range   WBC 7.8 4.0 - 10.5 K/uL   RBC 4.66 4.22 - 5.81 MIL/uL   Hemoglobin 14.2 13.0 - 17.0 g/dL   HCT 91.441.9 78.239.0 - 95.652.0 %   MCV 89.9 78.0 - 100.0 fL   MCH 30.5 26.0 - 34.0 pg   MCHC 33.9 30.0 - 36.0 g/dL   RDW 21.313.6 08.611.5 - 57.815.5 %   Platelets 199 150 - 400 K/uL  Urine rapid drug screen (hosp performed) ([facility])  Result Value Ref Range   Opiates NONE DETECTED NONE DETECTED   Cocaine NONE DETECTED NONE DETECTED   Benzodiazepines POSITIVE (A) NONE DETECTED   Amphetamines NONE DETECTED NONE DETECTED   Tetrahydrocannabinol NONE DETECTED NONE DETECTED   Barbiturates NONE DETECTED NONE DETECTED   No results found.   Doug SouSam Saidah Kempton, MD 07/07/15 [phone]

## 2015-07-07 NOTE — Tx Team (Signed)
Initial Interdisciplinary Treatment Plan   PATIENT STRESSORS: Financial difficulties Loss of mother, 2 years ago Occupational concerns Traumatic event   PATIENT STRENGTHS: Ability for insight Active sense of humor Average or above average intelligence Capable of independent living Communication skills Physical Health Supportive family/friends   PROBLEM LIST: Problem List/Patient Goals Date to be addressed Date deferred Reason deferred Estimated date of resolution  "My worsening depression" - depression 07/07/2015      "I was thinking the other night, it's either me or them" - SI 07/07/2015     "My counselor said I needed an assessment for PTSD" 07/07/2015     "My mother died two years ago and before that she was taken away by social services" 07/07/2015     "I'm trying to get disability now" - homeless, unemployed 07/07/2015                              DISCHARGE CRITERIA:  Ability to meet basic life and health needs Improved stabilization in mood, thinking, and/or behavior Motivation to continue treatment in a less acute level of care Safe-care adequate arrangements made Verbal commitment to aftercare and medication compliance  PRELIMINARY DISCHARGE PLAN: Attend aftercare/continuing care group Outpatient therapy Placement in alternative living arrangements  PATIENT/FAMIILY INVOLVEMENT: This treatment plan has been presented to and reviewed with the patient, Zachary Russo . The patient and family have been given the opportunity to ask questions and make suggestions.  Aurora Maskwyman, Nicolena Schurman E 07/07/2015, 11:51 AM

## 2015-07-07 NOTE — Progress Notes (Signed)
[facility]1 to Dr. Adela Glimpseabos. Patient is voluntary and will need to sign his papers prior to being transferred after 10am. Report:  [phone]    Deretha EmoryHannah Tilak Oakley LCSW, MSW Clinical Social Work: Emergency Room [phone]

## 2015-07-07 NOTE — ED Notes (Signed)
Pt on phone at nurses' desk advising friend, Hale BogusMara, [facility]. Pt signed consent forms - [facility]

## 2015-07-07 NOTE — ED Notes (Signed)
TTS computer placed at bedside.  

## 2015-07-07 NOTE — ED Provider Notes (Signed)
CSN: [id number]     Arrival date & time 07/07/15  0140 History  This chart was scribed for Mirian Mo, MD by Evon Slack, ED Scribe. This patient was seen in room A11C/A11C and the patient's care was started at 2:33 AM.    Chief Complaint  Patient presents with  . Depression  . Suicidal Ideations    Patient is a 54 y.o. male presenting with mental health disorder. The history is provided by the patient. No language interpreter was used.  Mental Health Problem Presenting symptoms: depression and suicidal thoughts   Degree of incapacity (severity):  Mild Onset quality:  Gradual Chronicity:  New Relieved by:  None tried Worsened by:  Lack of sleep Ineffective treatments:  None tried Associated symptoms: no abdominal pain and no chest pain    HPI Comments: Zachary Russo is a 54 y.o. male who presents to the Emergency Department complaining of depression for several days. Pt states that he is staying in a homeless shelter and his sleep schedule is off due to staying in the homeless shelter. Pt states that a lot of the residents in the homeless shelter don't follow the rules. Pt does report some SI with a plan of taking a clonopin and over dosing on propanolol. Pt denies any other symptoms.    Past Medical History  Diagnosis Date  . Hypertension   . Prostate disorder   . Anxiety   . Asperger syndrome   . H/O suicide attempt     overdose "many years ago"  . Restless leg syndrome    History reviewed. No pertinent past surgical history. No family history on file. History  Substance Use Topics  . Smoking status: Current Some Day Smoker  . Smokeless tobacco: Never Used  . Alcohol Use: No    Review of Systems  Cardiovascular: Negative for chest pain.  Gastrointestinal: Negative for abdominal pain.  Psychiatric/Behavioral: Positive for suicidal ideas and dysphoric mood.  All other systems reviewed and are negative.    Allergies  Amoxicillin; Cephalexin; and Codeine  Home  Medications   Prior to Admission medications   Medication Sig Start Date End Date Taking? Authorizing Provider  amLODipine (NORVASC) 5 MG tablet Take 10 mg by mouth daily before supper.    Historical Provider, MD  clonazePAM (KLONOPIN) 0.5 MG tablet Take 1 tablet (0.5 mg total) by mouth at bedtime as needed for anxiety. 02/01/15   Richardean Canal, MD  FLUoxetine (PROZAC) 20 MG capsule Take 60 mg by mouth daily before supper.     Historical Provider, MD  gabapentin (NEURONTIN) 300 MG capsule Take 300 mg by mouth 2 (two) times daily. One in am   One at 1200   2 at night    Historical Provider, MD  hydrOXYzine (ATARAX/VISTARIL) 25 MG tablet Take 50 mg by mouth daily before supper.    Historical Provider, MD  lisinopril (PRINIVIL,ZESTRIL) 20 MG tablet Take 1 tablet (20 mg total) by mouth daily. Patient taking differently: Take 20 mg by mouth daily before supper.  01/18/15   Zadie Rhine, MD  LORazepam (ATIVAN) 1 MG tablet Take 1 tablet (1 mg total) by mouth every 6 (six) hours as needed for anxiety. 06/17/15   Dione Booze, MD  PRESCRIPTION MEDICATION Clonidine patch    Historical Provider, MD  propranolol (INDERAL) 40 MG tablet Take 0.5 tablets (20 mg total) by mouth daily. 05/05/15   Antony Madura, PA-C  QUEtiapine Fumarate (SEROQUEL PO) Take 1 tablet by mouth at bedtime. Unknown strength  Historical Provider, MD  tamsulosin (FLOMAX) 0.4 MG CAPS capsule Take 1 capsule (0.4 mg total) by mouth daily after supper. Patient taking differently: Take 0.4 mg by mouth daily before supper.  12/20/13   Christiane Haorinna L Sullivan, MD   BP 113/86 mmHg  Pulse 81  Temp(Src) 97.7 F (36.5 C)  Resp 16  Ht 5\' 8"  (1.727 m)  Wt 170 lb (77.111 kg)  BMI 25.85 kg/m2  SpO2 96%   Physical Exam  Constitutional: He is oriented to person, place, and time. He appears well-developed and well-nourished.  HENT:  Head: Normocephalic and atraumatic.  Eyes: Conjunctivae and EOM are normal.  Neck: Normal range of motion. Neck supple.   Cardiovascular: Normal rate, regular rhythm and normal heart sounds.   Pulmonary/Chest: Effort normal and breath sounds normal. No respiratory distress.  Abdominal: He exhibits no distension. There is no tenderness. There is no rebound and no guarding.  Musculoskeletal: Normal range of motion.  Neurological: He is alert and oriented to person, place, and time.  Skin: Skin is warm and dry.  Vitals reviewed.   ED Course  Procedures (including critical care time) DIAGNOSTIC STUDIES: Oxygen Saturation is 96% on RA, normal by my interpretation.    COORDINATION OF CARE: 2:57 AM-Discussed treatment plan with pt at bedside and pt agreed to plan.     Labs Review Labs Reviewed  CBC  COMPREHENSIVE METABOLIC PANEL  ETHANOL  SALICYLATE LEVEL  ACETAMINOPHEN LEVEL  URINE RAPID DRUG SCREEN, HOSP PERFORMED    Imaging Review No results found.   EKG Interpretation None      MDM   Final diagnoses:  None      54 y.o. male with pertinent PMH of SI presents with recurrent SI.  No medical complaints.  Physical exam benign.  Consulted TTS.    I have reviewed all laboratory and imaging studies if ordered as above  1. Suicidal ideation           Mirian MoMatthew Gentry, MD 07/18/15 [phone]
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
## 2015-07-08 ENCOUNTER — Encounter (HOSPITAL_COMMUNITY): Payer: Self-pay | Admitting: Psychiatry

## 2015-07-08 DIAGNOSIS — R45851 Suicidal ideations: Secondary | ICD-10-CM

## 2015-07-08 DIAGNOSIS — F332 Major depressive disorder, recurrent severe without psychotic features: Secondary | ICD-10-CM

## 2015-07-08 MED ORDER — CLONAZEPAM 0.5 MG PO TABS
0.5000 mg | ORAL_TABLET | Freq: Two times a day (BID) | ORAL | Status: DC | PRN
Start: 2015-07-08 — End: 2015-07-09
  Administered 2015-07-08: 0.5 mg via ORAL
  Filled 2015-07-08: qty 1

## 2015-07-08 MED ORDER — ARIPIPRAZOLE 2 MG PO TABS
2.0000 mg | ORAL_TABLET | Freq: Every day | ORAL | Status: DC
  Administered 2015-07-08 – 2015-07-12 (×5): 2 mg via ORAL
  Filled 2015-07-08 (×7): qty 1
  Filled 2015-07-08: qty 14

## 2015-07-08 MED ORDER — CLONAZEPAM 0.5 MG PO TABS: 0.5000 mg | ORAL_TABLET | Freq: Every evening | ORAL | Status: DC | PRN

## 2015-07-08 MED ORDER — GABAPENTIN 400 MG PO CAPS
400.0000 mg | ORAL_CAPSULE | Freq: Three times a day (TID) | ORAL | Status: DC
  Administered 2015-07-09 – 2015-07-10 (×5): 400 mg via ORAL
  Filled 2015-07-08 (×10): qty 1

## 2015-07-08 MED ORDER — HYDROXYZINE HCL 25 MG PO TABS
25.0000 mg | ORAL_TABLET | Freq: Four times a day (QID) | ORAL | Status: DC | PRN
  Administered 2015-07-12: 25 mg via ORAL
  Filled 2015-07-08: qty 1
  Filled 2015-07-08: qty 20

## 2015-07-08 NOTE — Progress Notes (Signed)
[facility] Group Notes:  (Nursing/MHT/Case Management/Adjunct)  Date:  07/08/2015  Time:  7:56 PM  Type of Therapy:  Psychoeducational Skills  Participation Level:  Active  Participation Quality:  Appropriate  Affect:  Appropriate  Cognitive:  Appropriate  Insight:  Good  Engagement in Group:  Engaged  Modes of Intervention:  Discussion  Summary of Progress/Problems: In Wrap up group tonight Zachary Russo stated that his day was a 4-5 he was able to catch up on some of his sleep. He stated that hes had difficulty in the past due to his sleeping arrangements but hes glad that he is able to here. He feels much better due to his friend coming to visit him since he's been here.  Zachary Russo 07/08/2015, 7:56 PM

## 2015-07-08 NOTE — Progress Notes (Signed)
Recreation Therapy Notes  Date: 07.20.16 Time: 9:30 am Location: [address] Group Room  Group Topic: Stress Management  Goal Area(s) Addresses:  Patient will verbalize importance of using healthy stress management.  Patient will identify positive emotions associated with healthy stress management.   Intervention: Stress Management  Activity :  Progressive Muscle Relaxation.  LRT will introduce and educate patients on the stress management technique of progressive muscle relaxation.  A script was used to present  the technique to the patients.  Patients were asked to follow a long with the script read a loud by the LRT.  Education:  Stress Management, Discharge Planning.   Education Outcome: Acknowledges edcuation/In group clarification offered/Needs additional education  Clinical Observations/Feedback: Patient did not attend group.   Sherlin Sonier, LRT/CTRS         Tremell Reimers A 07/08/2015 10:05 AM 

## 2015-07-08 NOTE — Plan of Care (Signed)
Problem: Diagnosis: Increased Risk For Suicide Attempt Goal: STG-Patient Will Attend All Groups On The Unit Outcome: Progressing Pt attended evening group on 07/08/15.

## 2015-07-08 NOTE — Progress Notes (Signed)
At the beginning of the shift this evening, pt was in his room with a visitor.  When writer introduced self and told him that the Flowmax was on it's way from the pharmacy, pt then stated that he needed to take his klonopin before bedtime.  Pt was informed that he did not have klonopin ordered.  Previous RN reported that pt had already been informed that the klonopin had not been reordered and that he would need to talk with the MD in the AM.  This writer told pt that when his visitor left, he could come to get his Flowmax and that Clinical research associatewriter would talk to PA to see if he could get a one time order for the klonopin and then he could talk to the MD in the AM.  Pt took the Flowmax, but by the time Clinical research associatewriter spoke with PA and the klonopin was ordered, pt was asleep.  He is still asleep at this time.  Pt contracts for safety.  He denies HI/AVH.  He did not attend evening group, stating that the previous night he did not sleep well and wanted to go to bed early.  Support and encouragement offered.  Safety maintained with q15 minute checks.

## 2015-07-08 NOTE — Progress Notes (Signed)
Patient resting in bed this morning. Denies pain or physical complaints. Rates depression at a 3/10, anxiety and hopelessness both at a 4/10. States his goal today is to "find work" and he plans to meet this goal by meeting with an Restaurant manager, fast foodemployment counselor. Patient medicated per orders. Support, reassurance offered. Encouraged patient to be more visible in the milieu. He denies SI/HI and remains safe. Lawrence MarseillesFriedman, Bryor Rami Eakes

## 2015-07-08 NOTE — Plan of Care (Signed)
Problem: Ineffective individual coping Goal: STG: Patient will remain free from self harm Outcome: Progressing Patient has not engaged in self harm and denies SI  Problem: Diagnosis: Increased Risk For Suicide Attempt Goal: STG-Patient Will Comply With Medication Regime Outcome: Progressing Patient has been med compliant.      
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
## 2015-07-08 NOTE — [facility] Group Notes (Signed)
[facility]  07/08/2015 8:45 AM  Participation Quality: Alert, Appropriate and Oriented  Mood/Affect: Flat  Depression Rating: 2  Anxiety Rating: 4  Thoughts of Suicide: Pt denies SI/HI  Will you contract for safety? Yes  Current AVH: Pt denies  Plan for Discharge/Comments: Pt attended discharge planning group and actively participated in group. CSW discussed suicide prevention education with the group and encouraged them to discuss discharge planning and any relevant barriers. Pt reports that he is feeling better this morning; no needs identified. Will follow-up with [facility].  Transportation Means: Pt reports access to transportation  Supports: [name]  [name] Carter, LCSWA 07/08/2015 12:20 PM

## 2015-07-08 NOTE — [facility] Suicide Risk Assessment (Signed)
[facility] INPATIENT:  Family/Significant Other Suicide Prevention Education  Suicide Prevention Education:  Education Completed; Hortencia ConradiMara Barker, Pt's friend [phone]) has been identified by the patient as the family member/significant other with whom the patient will be residing, and identified as the person(s) who will aid the patient in the event of a mental health crisis (suicidal ideations/suicide attempt).  With written consent from the patient, the family member/significant other has been provided the following suicide prevention education, prior to the and/or following the discharge of the patient.  The suicide prevention education provided includes the following:  Suicide risk factors  Suicide prevention and interventions  National Suicide Hotline telephone number  [facility] assessment telephone number  [facility] 911  [facility] and/or Residential Mobile Crisis Unit telephone number  Request made of family/significant other to:  Remove weapons (e.g., guns, rifles, knives), all items previously/currently identified as safety concern.    Remove drugs/medications (over-the-counter, prescriptions, illicit drugs), all items previously/currently identified as a safety concern.  The family member/significant other verbalizes understanding of the suicide prevention education information provided.  The family member/significant other agrees to remove the items of safety concern listed above.  Elaina Hoopsarter, Randall Colden M 07/08/2015, 5:04 PM

## 2015-07-08 NOTE — [facility] Group Notes (Signed)
[facility] LCSW Group Therapy 07/08/2015 1:15 PM  Type of Therapy: Group Therapy- Emotion Regulation  Participation Level: Active   Participation Quality:  Appropriate  Affect: Appropriate  Cognitive: Alert and Oriented   Insight:  Developing/Improving  Engagement in Therapy: Developing/Improving and Engaged   Modes of Intervention: Clarification, Confrontation, Discussion, Education, Exploration, Limit-setting, Orientation, Problem-solving, Rapport Building, Dance movement psychotherapisteality Testing, Socialization and Support  Summary of Progress/Problems: The topic for group today was emotional regulation. This group focused on both positive and negative emotion identification and allowed group members to process ways to identify feelings, regulate negative emotions, and find healthy ways to manage internal/external emotions. Group members were asked to reflect on a time when their reaction to an emotion led to a negative outcome and explored how alternative responses using emotion regulation would have benefited them. Group members were also asked to discuss a time when emotion regulation was utilized when a negative emotion was experienced. Pt participated appropriately and identified anger as a primary emotion he has difficulty regulating. Pt was able to discuss specific triggers to his anger but admitted that he is unsure of how to regulate his anger once he encounters a trigger. Pt was receptive to feedback from peers and offered insightful comments related to his experience with emotion regulation.   Chad CordialLauren Carter, LCSWA 07/08/2015 4:03 PM

## 2015-07-08 NOTE — [facility] Suicide Risk Assessment (Signed)
[facility]   Nursing information obtained from:    Demographic factors:   54 year old single male, homeless, no current source of income  Current Mental Status:   see below  Loss Factors:   homelessness, poor support network , no income  Historical Factors:   depression Risk Reduction Factors:   resilience  Total Time spent with patient: 45 minutes Principal Problem: Major depressive disorder, recurrent episode Diagnosis:   Patient Active Problem List   Diagnosis Date Noted  . Major depressive disorder, recurrent episode [F33.9] 07/07/2015  . Anxiety disorder [F41.9] 01/27/2015  . Adjustment disorder with depressed mood [F43.21] 01/18/2015  . Acute encephalopathy [G93.40] 12/18/2013  . Delirium [R41.0] 12/18/2013  . 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 Abnormal brain CT [R93.0] 12/18/2013     Continued Clinical Symptoms:  Alcohol Use Disorder Identification Test Final Score (AUDIT): 0 The "Alcohol Use Disorders Identification Test", Guidelines for Use in Primary Care, Second Edition.  World Science writerHealth Organization [facility](WHO). Score between 0-7:  no or low risk or alcohol related problems. Score between 8-15:  moderate risk of alcohol related problems. Score between 16-19:  high risk of alcohol related problems. Score 20 or above:  warrants further diagnostic evaluation for alcohol dependence and treatment.   CLINICAL FACTORS:  54 year old male, history of depression, struggling with severe and chronic psychosocial stressors- homelessness, no income, living in shelter- developed increased depression and suicidal ideations. No psychosis.   Musculoskeletal: Strength & Muscle Tone: within normal limits Gait & Station: normal Patient leans: N/A  Psychiatric Specialty Exam: Physical Exam  ROS  Blood pressure 134/85, pulse 71, temperature 98.1 F (36.7 C), temperature source Oral, resp. rate 18, height 5\' 8"  (1.727 m), weight 172 lb (78.019 kg), SpO2 100  %.Body mass index is 26.16 kg/(m^2).  See Admit Note MSE                                                        COGNITIVE FEATURES THAT CONTRIBUTE TO RISK:  Closed-mindedness and Loss of executive function    SUICIDE RISK:   Moderate:  Frequent suicidal ideation with limited intensity, and duration, some specificity in terms of plans, no associated intent, good self-control, limited dysphoria/symptomatology, some risk factors present, and identifiable protective factors, including available and accessible social support.  PLAN OF CARE: Patient will be admitted to inpatient psychiatric unit for stabilization and safety. Will provide and encourage milieu participation. Provide medication management and maked adjustments as needed.  Will follow daily.    Medical Decision Making:  Review of Psycho-Social Stressors (1), Review or order clinical lab tests (1), Established Problem, Worsening (2) and Review of Medication Regimen & Side Effects (2)  I certify that inpatient services furnished can reasonably be expected to improve the patient's condition.   COBOS, FERNANDO 07/08/2015, 6:07 PM
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
## 2015-07-09 LAB — LIPID PANEL
Cholesterol: 189 mg/dL (ref 0–200)
HDL: 45 mg/dL (ref >40)
LDL Cholesterol: 124 mg/dL — ABNORMAL HIGH (ref 0–99)
Total CHOL/HDL Ratio: 4.2 RATIO
Triglycerides: 102 mg/dL (ref <150)
VLDL: 20 mg/dL (ref 0–40)

## 2015-07-09 LAB — HIV ANTIBODY (ROUTINE TESTING W REFLEX): HIV SCREEN 4TH GENERATION: NONREACTIVE

## 2015-07-09 LAB — TSH: TSH: 2.992 u[IU]/mL (ref 0.350–4.500)

## 2015-07-09 MED ORDER — CLONAZEPAM 1 MG PO TABS
1.0000 mg | ORAL_TABLET | Freq: Every evening | ORAL | Status: DC | PRN
  Administered 2015-07-09 – 2015-07-11 (×3): 1 mg via ORAL
  Filled 2015-07-09 (×4): qty 1

## 2015-07-09 NOTE — Progress Notes (Signed)
Patient visible in the milieu. Pleasant and cooperative. Anxious in mood however affect appropriate. Rates his depression at a 5/10, hopelessness at a 4/10 and reports being "too sleepy to rate his anxiety." Reports poor sleep last night and desires an increase in klonopin at hs to . MD made aware. BP continues to be slightly elevated. Medicated per orders. Support, reassurance offered. Discussing plans for discharge and accepts he will most likely have to return to [facility]. Denies SI/HI and remains safe. Lawrence Marseilles

## 2015-07-09 NOTE — BHH Group Notes (Signed)
[facility] Mental Health Association Group Therapy 07/09/2015 1:15pm  Type of Therapy: Mental Health Association Presentation  Participation Level: Active  Participation Quality: Attentive  Affect: Appropriate  Cognitive: Oriented  Insight: Developing/Improving  Engagement in Therapy: Engaged  Modes of Intervention: Discussion, Education and Socialization  Summary of Progress/Problems: Mental Health Association (MHA) Speaker came to talk about his personal journey with substance abuse and addiction. The pt processed ways by which to relate to the speaker. MHA speaker provided handouts and educational information pertaining to groups and services offered by the [facility]. Pt was engaged in speaker's presentation and was receptive to resources provided.    Chad Cordial, LCSWA 07/09/2015 1:31 PM

## 2015-07-09 NOTE — Progress Notes (Signed)
Patient attended karaoke and sang several songs, his participation was great.

## 2015-07-09 NOTE — Progress Notes (Signed)
[facility] MD Progress Note  07/09/2015 5:23 PM Zachary Russo  MRN:  [id number] Subjective: patient reports he feels better, is less depressed, and is focusing more on being discharged soon.  Sleep remains fair .  States medications " seem to be working well for me ".  Objective :  I have discussed case with treatment team and have met with patient. Staff reports that patient is improved- less depressed, affect more reactive . He has been going to groups, and is visible in milieu.  Behavior on unit calm, in well control.  He is tolerating medications well, feels they are helping.  HIV Negative , TSH WNL, Lipid panel overall unremarkable, except for slight increase in LDL.   Principal Problem: Major depressive disorder, recurrent episode Diagnosis:   Patient Active Problem List   Diagnosis Date Noted  . Major depressive disorder, recurrent episode [F33.9] 07/07/2015  . Anxiety disorder [F41.9] 01/27/2015  . Adjustment disorder with depressed mood [F43.21] 01/18/2015  . Acute encephalopathy [G93.40] 12/18/2013  . Delirium [R41.0] 12/18/2013  . 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 Abnormal brain CT [R93.0] 12/18/2013   Total Time spent with patient: 20 minutes   Past Medical History:  Past Medical History  Diagnosis Date  . Hypertension   . Prostate disorder   . Anxiety   . Asperger syndrome   . H/O suicide attempt     overdose "many years ago"  . Restless leg syndrome   . Depression    History reviewed. No pertinent past surgical history. Family History: History reviewed. No pertinent family history. Social History:  History  Alcohol Use No     History  Drug Use  . Yes  . Special: Marijuana    History   Social History  . Marital Status: Single    Spouse Name: N/A  . Number of Children: N/A  . Years of Education: N/A   Social History Main Topics  . Smoking status: Current Every Day Smoker -- 2 years    Types: Cigars  . Smokeless tobacco: Never Used  . Alcohol  Use: No  . Drug Use: Yes    Special: Marijuana  . Sexual Activity: Not Currently   Other Topics Concern  . None   Social History Narrative   Additional History:    Sleep: fair but improved  Appetite:  Good   Assessment:   Musculoskeletal: Strength & Muscle Tone: within normal limits Gait & Station: normal Patient leans: N/A   Psychiatric Specialty Exam: Physical Exam  ROS  Blood pressure 135/92, pulse 89, temperature 98.3 F (36.8 C), temperature source Oral, resp. rate 18, height _0  (1.727 m), weight 172 lb (78.019 kg), SpO2 100 %.Body mass index is 26.16 kg/(m^2).  General Appearance: Fairly Groomed  Engineer, water::  Good  Speech:  Normal Rate  Volume:  Normal  Mood:  improved, " middle of the road"  Affect:  more reactive   Thought Process:  Goal Directed and Linear  Orientation:  Full (Time, Place, and Person)  Thought Content:  no hallucinations, no delusions  Suicidal Thoughts:  No- denies any thoughts of hurting self or of SI  Homicidal Thoughts:  No  Memory:  recent and remote grossly intact   Judgement:  Other:  improved   Insight:  improved   Psychomotor Activity:  Normal  Concentration:  Good  Recall:  Good  Fund of Knowledge:Good  Language: Good  Akathisia:  Negative  Handed:  Right  AIMS (if indicated):     Assets:  Communication Skills Desire for Improvement Resilience  ADL's: improved   Cognition: WNL  Sleep:  Number of Hours: 6.5     Current Medications: Current Facility-Administered Medications  Medication Dose Route Frequency Provider Last Rate Last Dose  . acetaminophen (TYLENOL) tablet 650 mg  650 mg Oral Q6H PRN Zachary Slates, NP      . alum & mag hydroxide-simeth (MAALOX/MYLANTA) 200-200-20 MG/5ML suspension 30 mL  30 mL Oral Q4H PRN Zachary Slates, NP      . amLODipine (NORVASC) tablet 10 mg  10 mg Oral QAC supper Zachary Slates, NP   10 mg at 07/09/15 1719  . ARIPiprazole (ABILIFY) tablet 2 mg  2 mg Oral Daily Zachary Campus,  MD   2 mg at 07/09/15 0813  . clonazePAM (KLONOPIN) tablet 0.5 mg  0.5 mg Oral Once Zachary Hobby, PA-C   0.5 mg at 07/07/15 2245  . clonazePAM (KLONOPIN) tablet 0.5 mg  0.5 mg Oral BID PRN Zachary Campus, MD   0.5 mg at 07/08/15 2125  . FLUoxetine (PROZAC) capsule 60 mg  60 mg Oral QAC supper Zachary Slates, NP   60 mg at 07/09/15 1719  . gabapentin (NEURONTIN) capsule 400 mg  400 mg Oral TID Zachary Campus, MD   400 mg at 07/09/15 1719  . hydrOXYzine (ATARAX/VISTARIL) tablet 25 mg  25 mg Oral Q6H PRN Zachary Peer Lashara Urey, MD      . lisinopril (PRINIVIL,ZESTRIL) tablet 20 mg  20 mg Oral Daily Zachary Slates, NP   20 mg at 07/09/15 0813  . magnesium hydroxide (MILK OF MAGNESIA) suspension 30 mL  30 mL Oral Daily PRN Zachary Slates, NP      . propranolol (INDERAL) tablet 20 mg  20 mg Oral Daily Zachary Slates, NP   20 mg at 07/09/15 0813  . tamsulosin (FLOMAX) capsule 0.4 mg  0.4 mg Oral QPC supper Zachary Slates, NP   0.4 mg at 07/09/15 1719    Lab Results:  Results for orders placed or performed during the hospital encounter of 07/07/15 (from the past 48 hour(s))  HIV antibody     Status: None   Collection Time: 07/09/15  6:30 AM  Result Value Ref Range   HIV Screen 4th Generation wRfx Non Reactive Non Reactive    Comment: (NOTE) Performed At: [facility] [place], [place] [id number] Lindon Romp MD BS:[id number] Performed at [facility]   TSH     Status: None   Collection Time: 07/09/15  6:30 AM  Result Value Ref Range   TSH 2.992 0.350 - 4.500 uIU/mL    Comment: Performed at [facility]  Lipid panel     Status: Abnormal   Collection Time: 07/09/15  6:30 AM  Result Value Ref Range   Cholesterol 189 0 - 200 mg/dL   Triglycerides 102 <150 mg/dL   HDL 45 >40 mg/dL   Total CHOL/HDL Ratio 4.2 RATIO   VLDL 20 0 - 40 mg/dL   LDL Cholesterol 124 (H) 0 - 99 mg/dL    Comment:        Total Cholesterol/HDL:CHD Risk Coronary  Heart Disease Risk Table                     Men   Women  1/2 Average Risk   3.4   3.3  Average Risk       5.0   4.4  2 X Average Risk   9.6   7.1  3 X Average Risk  23.4   11.0        Use the calculated Patient Ratio above and the CHD Risk Table to determine the patient's CHD Risk.        ATP III CLASSIFICATION (LDL):  <100     mg/dL   Optimal  100-129  mg/dL   Near or Above                    Optimal  130-159  mg/dL   Borderline  160-189  mg/dL   High  >190     mg/dL   Very High Performed at [facility]     Physical Findings: AIMS: Facial and Oral Movements Muscles of Facial Expression: None, normal Lips and Perioral Area: None, normal Jaw: None, normal Tongue: None, normal,Extremity Movements Upper (arms, wrists, hands, fingers): None, normal Lower (legs, knees, ankles, toes): None, normal, Trunk Movements Neck, shoulders, hips: None, normal, Overall Severity Severity of abnormal movements (highest score from questions above): None, normal Incapacitation due to abnormal movements: None, normal Patient's awareness of abnormal movements (rate only patient's report): No Awareness, Dental Status Current problems with teeth and/or dentures?: No Does patient usually wear dentures?: No  CIWA:    COWS:  COWS Total Score: 2   Assessment- Overall patient is improving, with improved mood and range of affect. Tolerating medications well. His sleep does remain somewhat sub optimal- we discussed options and decided to adjust Klonopin to 1 mgr QHS PRN for insomnia or night time anxiety.   Treatment Plan Summary: Daily contact with patient to assess and evaluate symptoms and progress in treatment, Medication management, Plan inpatient treatment and medications as below   Neurontin 400 mgrs TID for pain and anxiety Prozac 60 mgrs QDAY for depression, anxiety Abilify 2 mgrs QDAY as antidepressant augmentation Change Klonopin to 1 mgr QHS PRN for insomnia . On Amlodipine,  Zestril for HTN.  On Flomax for BPH.   Medical Decision Making:  Established Problem, Stable/Improving (1), Review of Psycho-Social Stressors (1), Review or order clinical lab tests (1) and Review of Medication Regimen & Side Effects (2)     Feliciana Narayan 07/09/2015, 5:23 PM

## 2015-07-09 NOTE — Plan of Care (Signed)
Problem: Alteration in mood Goal: STG-Patient is able to discuss feelings and issues (Patient is able to discuss feelings and issues leading to depression)  Outcome: Progressing Patient conversant and able to talk about previous stressors, plan for discharge.

## 2015-07-09 NOTE — Progress Notes (Signed)
D: Pt has anxious affect and mood.  He reports he had a good visit with "my really good friend."  Pt reports his day was "not too bad."  Pt reports his goal was "basically I was just catching up on my sleep."  Pt then discussed how he was having difficulty sleeping at the homeless shelter he was at prior to admission.   Pt denies SI/HI, denies hallucinations, denies pain.  Pt has been visible in milieu interacting with peers and staff appropriately.  Pt attended evening group.   A: Introduced self to pt.  Met with pt 1:1 and provided support and encouragement.  Actively listened to pt.  Medications administered per order.  PRN medication administered for sleep. R: Pt is compliant with medications.  Pt verbally contracts for safety.  Will continue to monitor and assess.

## 2015-07-10 LAB — HEMOGLOBIN A1C
Hgb A1c MFr Bld: 6.2 % — ABNORMAL HIGH (ref 4.8–5.6)
Mean Plasma Glucose: 131 mg/dL

## 2015-07-10 MED ORDER — GABAPENTIN 400 MG PO CAPS
800.0000 mg | ORAL_CAPSULE | Freq: Every day | ORAL | Status: DC
  Administered 2015-07-10 – 2015-07-11 (×2): 800 mg via ORAL
  Filled 2015-07-10 (×7): qty 2

## 2015-07-10 MED ORDER — GABAPENTIN 400 MG PO CAPS
400.0000 mg | ORAL_CAPSULE | Freq: Two times a day (BID) | ORAL | Status: DC
  Administered 2015-07-11 – 2015-07-12 (×3): 400 mg via ORAL
  Filled 2015-07-10: qty 1
  Filled 2015-07-10 (×2): qty 56
  Filled 2015-07-10 (×4): qty 1

## 2015-07-10 NOTE — Clinical Social Work Note (Signed)
REferral made to [facility] for help w housing.  Santa Genera, LCSW Clinical Social Worker

## 2015-07-10 NOTE — Progress Notes (Signed)
Recreation Therapy Notes  Date: 07.22.16 Time: 9:30 am Location: [address] Group Room  Group Topic: Stress Management  Goal Area(s) Addresses:  Patient will verbalize importance of using healthy stress management.  Patient will identify positive emotions associated with healthy stress management.   Intervention: Stress Management   Activity :  Guided Imagery Script.  LRT introduced and educated patients on stress management  Technique of guided imagery.  A script was used to deliver the technique to patients.  Patients were asked to follow script read a loud by LRT to engage in practicing the stress management technique.  Education:  Stress Management, Discharge Planning.   Education Outcome: Acknowledges edcuation/In group clarification offered/Needs additional education  Clinical Observations/Feedback: Patient did not attend group.   Nyshawn Gowdy, LRT/CTRS         Raynold Blankenbaker A 07/10/2015 3:31 PM 

## 2015-07-10 NOTE — Progress Notes (Signed)
D: Pt has appropriate affect and anxious mood.  Pt reports he had a "wonderful" visit with his friend tonight.  Pt reports his day was "better, I'm in a better mood today, the doctor even noticed I'm more animated today."  Pt denies SI/HI, denies hallucinations, reports pain from headache of 2/10.  Pt has been visible in milieu interacting with peers.  Pt attended evening group and reported that he sang multiple songs during [place].   A:  Met with pt 1:1 and provided support and encouragement.  Actively listened to pt.  PRN medication administered for sleep and pain.  Pt was redirected for stepping into peer's room.  Pt was standing inside the doorway talking to peer.  Reminded pt of treatment agreement and he verbalized understanding.  Charge nurse notified.   R:  Pt verbally contracts for safety and reports that he will notify staff of needs and concerns.  Will continue to monitor and assess.

## 2015-07-10 NOTE — BHH Group Notes (Signed)
[facility] LCSW Aftercare Discharge Planning Group Note  07/10/2015 8:45 AM  Pt did not attend, declined invitation.   Chad Cordial, LCSWA 07/10/2015 10:12 AM

## 2015-07-10 NOTE — Progress Notes (Signed)
D) Pt. Reports minimal issues.  Silly and flirtatious at times.  Inquisitive about medication and shows knowledgable about medications and his schedule.  A) Redirected from inappropriate interactions with male peer on unit, and encouraged to focus on issues surrounding hospitalization.  R) Pt. Cooperative, but continues focused on medication and shares little with regard to issues.  Pt. Continues on q 15 min. Observations and is safe at this time.

## 2015-07-10 NOTE — Plan of Care (Signed)
Problem: Alteration in mood Goal: LTG-Patient reports reduction in suicidal thoughts (Patient reports reduction in suicidal thoughts and is able to verbalize a safety plan for whenever patient is feeling suicidal)  Outcome: Progressing Pt denied SI this shift.  He verbally contracted for safety.       

## 2015-07-10 NOTE — Progress Notes (Signed)
Patient ID: Zachary Russo, male   DOB: 01-Dec-1961, 54 y.o.   MRN: [id number] [facility] MD Progress Note  07/10/2015 5:19 PM Zachary Russo  MRN:  [id number] Subjective:  Overall patient is feeling better, less depressed, and states that after finding out that Education officer, museum has referred him to "[facility]" he is feeling more hopeful. He is medication focused, and feels his Neurontin dose, particularly QHS dose, is not sufficient. He states that prior to admission he was on 400 mgrs BID and 800 mgrs QHS and that this higher dose was more effective . He states his sleep has been fair.  Objective :  I have discussed case with treatment team and have met with patient. Staff reports that patient  Has improved compared to admission- he is feeling less depressed, and affect is more reactive . No disruptive or agitated behaviors on unit. No side effects reported . We discussed options to manage insomnia- states Vistaril QHS has been effective and well tolerated . He is starting to focus more on discharge- states that he plans to return to homeless shelter but is hoping that he will eventually get help with other housing options. Labs reviewed.    Principal Problem: Major depressive disorder, recurrent episode Diagnosis:   Patient Active Problem List   Diagnosis Date Noted  . Major depressive disorder, recurrent episode [F33.9] 07/07/2015  . Anxiety disorder [F41.9] 01/27/2015  . Adjustment disorder with depressed mood [F43.21] 01/18/2015  . Acute encephalopathy [G93.40] 12/18/2013  . Delirium [R41.0] 12/18/2013  . 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 Abnormal brain CT [R93.0] 12/18/2013   Total Time spent with patient: 20 minutes   Past Medical History:  Past Medical History  Diagnosis Date  . Hypertension   . Prostate disorder   . Anxiety   . Asperger syndrome   . H/O suicide attempt     overdose "many years ago"  . Restless leg syndrome   . Depression    History reviewed. No  pertinent past surgical history. Family History: History reviewed. No pertinent family history. Social History:  History  Alcohol Use No     History  Drug Use  . Yes  . Special: Marijuana    History   Social History  . Marital Status: Single    Spouse Name: N/A  . Number of Children: N/A  . Years of Education: N/A   Social History Main Topics  . Smoking status: Current Every Day Smoker -- 2 years    Types: Cigars  . Smokeless tobacco: Never Used  . Alcohol Use: No  . Drug Use: Yes    Special: Marijuana  . Sexual Activity: Not Currently   Other Topics Concern  . None   Social History Narrative   Additional History:    Sleep: Fair  Appetite:  Good   Assessment:   Musculoskeletal: Strength & Muscle Tone: within normal limits Gait & Station: normal Patient leans: N/A   Psychiatric Specialty Exam: Physical Exam  ROS- denies chest pain, denies SOB, denies nausea or vomiting  Blood pressure 126/86, pulse 88, temperature 99.3 F (37.4 C), temperature source Oral, resp. rate 16, height 5' 8"  (1.727 m), weight 172 lb (78.019 kg), SpO2 100 %.Body mass index is 26.16 kg/(m^2).  General Appearance: improved grooming  Eye Contact::  Good  Speech:  Normal Rate  Volume:  Normal  Mood:  Improving mood   Affect:  more reactive , smiles at times , appropriately  Thought Process:  Goal Directed and Linear  Orientation:  Full (Time, Place, and Person)  Thought Content:  no hallucinations, no delusions  Suicidal Thoughts:  No- denies any thoughts of hurting self or of SI  Homicidal Thoughts:  No  Memory:  recent and remote grossly intact   Judgement:  Other:  improved   Insight:  improved   Psychomotor Activity:  Normal  Concentration:  Good  Recall:  Good  Fund of Knowledge:Good  Language: Good  Akathisia:  Negative  Handed:  Right  AIMS (if indicated):     Assets:  Communication Skills Desire for Improvement Resilience  ADL's: improved   Cognition: WNL   Sleep:  Number of Hours: 6.5     Current Medications: Current Facility-Administered Medications  Medication Dose Route Frequency Provider Last Rate Last Dose  . acetaminophen (TYLENOL) tablet 650 mg  650 mg Oral Q6H PRN Encarnacion Slates, NP   650 mg at 07/09/15 2014  . alum & mag hydroxide-simeth (MAALOX/MYLANTA) 200-200-20 MG/5ML suspension 30 mL  30 mL Oral Q4H PRN Encarnacion Slates, NP      . amLODipine (NORVASC) tablet 10 mg  10 mg Oral QAC supper Encarnacion Slates, NP   10 mg at 07/10/15 1719  . ARIPiprazole (ABILIFY) tablet 2 mg  2 mg Oral Daily Jenne Campus, MD   2 mg at 07/10/15 0849  . clonazePAM (KLONOPIN) tablet 0.5 mg  0.5 mg Oral Once Laverle Hobby, PA-C   0.5 mg at 07/07/15 2245  . clonazePAM (KLONOPIN) tablet 1 mg  1 mg Oral QHS PRN Jenne Campus, MD   1 mg at 07/09/15 2142  . FLUoxetine (PROZAC) capsule 60 mg  60 mg Oral QAC supper Encarnacion Slates, NP   60 mg at 07/10/15 1718  . gabapentin (NEURONTIN) capsule 400 mg  400 mg Oral TID Jenne Campus, MD   400 mg at 07/10/15 1209  . hydrOXYzine (ATARAX/VISTARIL) tablet 25 mg  25 mg Oral Q6H PRN Myer Peer Trinidi Toppins, MD      . lisinopril (PRINIVIL,ZESTRIL) tablet 20 mg  20 mg Oral Daily Encarnacion Slates, NP   20 mg at 07/10/15 0848  . magnesium hydroxide (MILK OF MAGNESIA) suspension 30 mL  30 mL Oral Daily PRN Encarnacion Slates, NP      . propranolol (INDERAL) tablet 20 mg  20 mg Oral Daily Encarnacion Slates, NP   20 mg at 07/10/15 0848  . tamsulosin (FLOMAX) capsule 0.4 mg  0.4 mg Oral QPC supper Encarnacion Slates, NP   0.4 mg at 07/09/15 1719    Lab Results:  Results for orders placed or performed during the hospital encounter of 07/07/15 (from the past 48 hour(s))  HIV antibody     Status: None   Collection Time: 07/09/15  6:30 AM  Result Value Ref Range   HIV Screen 4th Generation wRfx Non Reactive Non Reactive    Comment: (NOTE) Performed At: [facility] [address] [place], [place] [id number] Lindon Romp MD  [id number] Performed at [facility]   TSH     Status: None   Collection Time: 07/09/15  6:30 AM  Result Value Ref Range   TSH 2.992 0.350 - 4.500 uIU/mL    Comment: Performed at [facility]  Hemoglobin A1c     Status: Abnormal   Collection Time: 07/09/15  6:30 AM  Result Value Ref Range   Hgb A1c MFr Bld 6.2 (H) 4.8 - 5.6 %  Comment: (NOTE)         Pre-diabetes: 5.7 - 6.4         Diabetes: >6.4         Glycemic control for adults with diabetes: <7.0    Mean Plasma Glucose 131 mg/dL    Comment: (NOTE) Performed At: [facility] [place], [place] [id number] Lindon Romp MD [id number] Performed at [facility]   Lipid panel     Status: Abnormal   Collection Time: 07/09/15  6:30 AM  Result Value Ref Range   Cholesterol 189 0 - 200 mg/dL   Triglycerides 102 <150 mg/dL   HDL 45 >40 mg/dL   Total CHOL/HDL Ratio 4.2 RATIO   VLDL 20 0 - 40 mg/dL   LDL Cholesterol 124 (H) 0 - 99 mg/dL    Comment:        Total Cholesterol/HDL:CHD Risk Coronary Heart Disease Risk Table                     Men   Women  1/2 Average Risk   3.4   3.3  Average Risk       5.0   4.4  2 X Average Risk   9.6   7.1  3 X Average Risk  23.4   11.0        Use the calculated Patient Ratio above and the CHD Risk Table to determine the patient's CHD Risk.        ATP III CLASSIFICATION (LDL):  <100     mg/dL   Optimal  100-129  mg/dL   Near or Above                    Optimal  130-159  mg/dL   Borderline  160-189  mg/dL   High  >190     mg/dL   Very High Performed at [facility]     Physical Findings: AIMS: Facial and Oral Movements Muscles of Facial Expression: None, normal Lips and Perioral Area: None, normal Jaw: None, normal Tongue: None, normal,Extremity Movements Upper (arms, wrists, hands, fingers): None, normal Lower (legs, knees, ankles, toes): None, normal, Trunk Movements Neck, shoulders,  hips: None, normal, Overall Severity Severity of abnormal movements (highest score from questions above): None, normal Incapacitation due to abnormal movements: None, normal Patient's awareness of abnormal movements (rate only patient's report): No Awareness, Dental Status Current problems with teeth and/or dentures?: No Does patient usually wear dentures?: No  CIWA:    COWS:  COWS Total Score: 2   Assessment- mood and affect improving, seems more hopeful and  Future oriented. Tolerating medications well. Insomnia is still an issue . Focused on medication issues, and particularly in increasing Neurontin dose, as above .    Treatment Plan Summary: Daily contact with patient to assess and evaluate symptoms and progress in treatment, Medication management, Plan inpatient treatment and medications as below   Increase Neurontin 400 mgrs BID and 800 mgrs QHS  for pain and anxiety Prozac 60 mgrs QDAY for depression, anxiety Abilify 2 mgrs QDAY as antidepressant augmentation Vistaril PRNs for anxiety or insomnia if needed . Change Klonopin to 1 mgr QHS PRN for insomnia . On Amlodipine, Zestril for HTN.  On Flomax for BPH.   Medical Decision Making:  Established Problem, Stable/Improving (1), Review of Psycho-Social Stressors (1), Review or order clinical lab tests (1) and Review of Medication Regimen & Side Effects (2)  Zachary Russo 07/10/2015, 5:19 PM

## 2015-07-10 NOTE — Progress Notes (Signed)
Adult Psychoeducational Group Note  Date:  07/10/2015 Time:  9:57 PM  Group Topic/Focus:  Wrap-Up Group:   The focus of this group is to help patients review their daily goal of treatment and discuss progress on daily workbooks.  Participation Level:  Active  Participation Quality:  Appropriate  Affect:  Appropriate and Flat  Cognitive:  Appropriate  Insight: Appropriate  Engagement in Group:  Engaged  Modes of Intervention:  Discussion  Additional Comments:  Patient rated his day a 6.5 He stated that his goal was to catch up on sleep and improve his mood. Says since he has been here he has gotten more sleep.  Natasha Mead 07/10/2015, 9:57 PM

## 2015-07-10 NOTE — Progress Notes (Signed)
D: Patient reported headache of 4/10. Offered and accepted acetaminophen 650 mg PO PRN for pain. Remains in his room most time during the shift. Made no other complaint. Cheerful and polite. Will continue to monitor patient.  A: Patient encouraged to verbalize needs to staff. Offered support. Due medication given as ordered. Every 15 minutes check for safety maintained. Will continue to  Monitor patient for safety and stability.  R: Patient is safe.

## 2015-07-10 NOTE — [facility] Group Notes (Signed)
[facility] LCSW Group Therapy 07/10/2015 1:15pm  Type of Therapy: Group Therapy- Feelings Around Relapse and Recovery  Pt did not attend, declined invitation.   Chad Cordial, Theresia Majors [phone] 07/10/2015 2:58 PM

## 2015-07-11 DIAGNOSIS — F339 Major depressive disorder, recurrent, unspecified: Principal | ICD-10-CM

## 2015-07-11 MED ORDER — CLONIDINE HCL 0.1 MG/24HR TD PTWK
0.1000 mg | MEDICATED_PATCH | TRANSDERMAL | Status: DC
  Administered 2015-07-11: 0.1 mg via TRANSDERMAL
  Filled 2015-07-11: qty 1

## 2015-07-11 NOTE — [facility] Group Notes (Signed)
[facility] LCSW Group Therapy  07/11/2015 1:15 PM  Type of Therapy:  Group Therapy  Participation Level:  Active  Participation Quality:  Intrusive, Monopolizing and Sharing  Affect:  Appropriate  Cognitive:  Alert and Oriented  Insight:  Monopolizing  Engagement in Therapy:  Monopolizing  Modes of Intervention:  Discussion, Education, Problem-solving, Socialization and Support  Summary of Progress/Problems:  The main focus of today's process group was for the patient to identify ways in which they have in the past sabotaged their own recovery. Motivational Interviewing was utilized to ask the group members what they get out of their substance use, and what reasons they may have for wanting to change. The Stages of Change were explained using a handout, and patients identified where they currently are with regard to stages of change. Patient shared his tendency to focus on the negative and his plan to reinforce the positive in the future verses looking only at the negative. Patient was monopolizing with his comments and intrusive behaviors, taking the marker away from facilitator and rewriting comments. Patient responded well to redirection what was ongoing.    Clide Dales 07/11/2015, 1:15 PM

## 2015-07-11 NOTE — Plan of Care (Signed)
Problem: Alteration in mood Goal: STG-Patient reports thoughts of self-harm to staff Outcome: Progressing Patient open about progress. Denies SI  Problem: Diagnosis: Increased Risk For Suicide Attempt Goal: LTG-Patient Will Report Absence of Withdrawal Symptoms LTG (by discharge): Patient will report absence of withdrawal symptoms.  Outcome: Completed/Met Date Met:  07/11/15 Patient free of withdrawal x several days.     

## 2015-07-11 NOTE — Progress Notes (Signed)
D: Pt presents appropriate in affect and pleasant in mood. Pt reports readiness to go home on Sunday. Pt is active and visible within the milieu. Pt anticipates that he may have difficulty going to sleep. Writer informed pt that I would review his MAR for the available options. Pt will be informed accordingly. Pt discussed how Seroquel helped in the past but how it was discontinued due to weight gain. Pt informed that this may not be restarted due to being d/c tomorrow and his past concerns. Pt is currently negative for any SI/HI/AVH. Pt verbally contracts for safety.  A: Writer administered scheduled and prn medications to pt, per MD orders. Continued support and availability as needed was extended to this pt. Staff continue to monitor pt with q2min checks.  R: No adverse drug reactions noted. Pt receptive to treatment. Pt remains safe at this time.

## 2015-07-11 NOTE — Progress Notes (Signed)
Patient ID: Zachary Russo, male   DOB: 07/01/1961, 54 y.o.   MRN: [id number] [facility] MD Progress Note  07/11/2015 3:07 PM [name]  MRN:  [id number] Subjective:  "I thought I was going to get home today.  I feel ready."  States medications " seem to be working."   Objective :  I have discussed case with treatment team and have met with patient. Patient is calm and pleasant.  He was hoping to have been discharged today. He has been going to groups, and is visible in milieu.  Behavior on unit calm, in well control.  No disruptive behaviors noted  He is tolerating medications well, feels they are helping.  No adverse drug reactions.    Principal Problem: Major depressive disorder, recurrent episode Diagnosis:   Patient Active Problem List   Diagnosis Date Noted  . Major depressive disorder, recurrent episode [F33.9] 07/07/2015  . Anxiety disorder [F41.9] 01/27/2015  . Adjustment disorder with depressed mood [F43.21] 01/18/2015  . Acute encephalopathy [G93.40] 12/18/2013  . Delirium [R41.0] 12/18/2013  . 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 Abnormal brain CT [R93.0] 12/18/2013   Total Time spent with patient: 20 minutes   Past Medical History:  Past Medical History  Diagnosis Date  . Hypertension   . Prostate disorder   . Anxiety   . Asperger syndrome   . H/O suicide attempt     overdose "many years ago"  . Restless leg syndrome   . Depression    History reviewed. No pertinent past surgical history. Family History: History reviewed. No pertinent family history. Social History:  History  Alcohol Use No     History  Drug Use  . Yes  . Special: Marijuana    History   Social History  . Marital Status: Single    Spouse Name: N/A  . Number of Children: N/A  . Years of Education: N/A   Social History Main Topics  . Smoking status: Current Every Day Smoker -- 2 years    Types: Cigars  . Smokeless tobacco: Never Used  . Alcohol Use: No  . Drug Use: Yes    Special:  Marijuana  . Sexual Activity: Not Currently   Other Topics Concern  . None   Social History Narrative   Additional History:    Sleep: fair but improved  Appetite:  Good   Assessment:  Zachary Russo is a 54 year old admitted for depression.  He also has hx of Asperger's.  Musculoskeletal: Strength & Muscle Tone: within normal limits Gait & Station: normal Patient leans: N/A   Psychiatric Specialty Exam: Physical Exam  ROS  Blood pressure 141/99, pulse 88, temperature 97.5 F (36.4 C), temperature source Oral, resp. rate 16, height 5' 8"  (1.727 m), weight 78.019 kg (172 lb), SpO2 100 %.Body mass index is 26.16 kg/(m^2).  General Appearance: Fairly Groomed  Engineer, water::  Good  Speech:  Normal Rate  Volume:  Normal  Mood:  improved, " middle of the road"  Affect:  more reactive   Thought Process:  Goal Directed and Linear  Orientation:  Full (Time, Place, and Person)  Thought Content:  no hallucinations, no delusions  Suicidal Thoughts:  No- denies any thoughts of hurting self or of SI  Homicidal Thoughts:  No  Memory:  recent and remote grossly intact   Judgement:  Other:  improved   Insight:  improved   Psychomotor Activity:  Normal  Concentration:  Good  Recall:  [place] of Knowledge:Good  Language: Good  Akathisia:  Negative  Handed:  Right  AIMS (if indicated):     Assets:  Communication Skills Desire for Improvement Resilience  ADL's: improved   Cognition: WNL  Sleep:  Number of Hours: 6.5     Current Medications: Current Facility-Administered Medications  Medication Dose Route Frequency Provider Last Rate Last Dose  . acetaminophen (TYLENOL) tablet 650 mg  650 mg Oral Q6H PRN Encarnacion Slates, NP   650 mg at 07/10/15 2118  . alum & mag hydroxide-simeth (MAALOX/MYLANTA) 200-200-20 MG/5ML suspension 30 mL  30 mL Oral Q4H PRN Encarnacion Slates, NP      . amLODipine (NORVASC) tablet 10 mg  10 mg Oral QAC supper Encarnacion Slates, NP   10 mg at 07/10/15 1719  .  ARIPiprazole (ABILIFY) tablet 2 mg  2 mg Oral Daily Jenne Campus, MD   2 mg at 07/11/15 0808  . clonazePAM (KLONOPIN) tablet 0.5 mg  0.5 mg Oral Once Laverle Hobby, PA-C   0.5 mg at 07/07/15 2245  . clonazePAM (KLONOPIN) tablet 1 mg  1 mg Oral QHS PRN Jenne Campus, MD   1 mg at 07/10/15 2118  . cloNIDine (CATAPRES - Dosed in mg/24 hr) patch 0.1 mg  0.1 mg Transdermal Weekly Myer Peer Cobos, MD   0.1 mg at 07/11/15 1316  . FLUoxetine (PROZAC) capsule 60 mg  60 mg Oral QAC supper Encarnacion Slates, NP   60 mg at 07/10/15 1718  . gabapentin (NEURONTIN) capsule 400 mg  400 mg Oral BID Jenne Campus, MD   400 mg at 07/11/15 0807  . gabapentin (NEURONTIN) capsule 800 mg  800 mg Oral QHS Myer Peer Cobos, MD   800 mg at 07/10/15 2117  . hydrOXYzine (ATARAX/VISTARIL) tablet 25 mg  25 mg Oral Q6H PRN Myer Peer Cobos, MD      . lisinopril (PRINIVIL,ZESTRIL) tablet 20 mg  20 mg Oral Daily Encarnacion Slates, NP   20 mg at 07/11/15 0808  . magnesium hydroxide (MILK OF MAGNESIA) suspension 30 mL  30 mL Oral Daily PRN Encarnacion Slates, NP      . propranolol (INDERAL) tablet 20 mg  20 mg Oral Daily Encarnacion Slates, NP   20 mg at 07/11/15 0808  . tamsulosin (FLOMAX) capsule 0.4 mg  0.4 mg Oral QPC supper Encarnacion Slates, NP   0.4 mg at 07/10/15 1846    Lab Results:  No results found for this or any previous visit (from the past 48 hour(s)).  Physical Findings: AIMS: Facial and Oral Movements Muscles of Facial Expression: None, normal Lips and Perioral Area: None, normal Jaw: None, normal Tongue: None, normal,Extremity Movements Upper (arms, wrists, hands, fingers): None, normal Lower (legs, knees, ankles, toes): None, normal, Trunk Movements Neck, shoulders, hips: None, normal, Overall Severity Severity of abnormal movements (highest score from questions above): None, normal Incapacitation due to abnormal movements: None, normal Patient's awareness of abnormal movements (rate only patient's report): No  Awareness, Dental Status Current problems with teeth and/or dentures?: No Does patient usually wear dentures?: No  CIWA:    COWS:  COWS Total Score: 2   Assessment- Overall patient is improving, with improved mood and range of affect. Tolerating medications well. His sleep does remain somewhat sub optimal- we discussed options and decided to adjust Klonopin to 1 mgr QHS PRN for insomnia or night time anxiety.   Treatment Plan Summary: Daily contact with patient to assess and evaluate symptoms  and progress in treatment, Medication management, Plan inpatient treatment and medications as below   Continue Neurontin 400 mgrs TID for pain and anxiety Continue Prozac 60 mgrs QDAY for depression, anxiety Continue Abilify 2 mgrs QDAY as antidepressant augmentation Continue Klonopin to 1 mgr QHS PRN for insomnia . On Amlodipine, Zestril for HTN.  On Flomax for BPH.   Medical Decision Making:  Established Problem, Stable/Improving (1), Review of Psycho-Social Stressors (1), Review or order clinical lab tests (1) and Review of Medication Regimen & Side Effects (2)  Freda Munro May Agustin AGNP-BC 07/11/2015, 3:07 PM Patient seen face-to-face for psychiatric evaluation, chart reviewed and case discussed with the physician extender and developed treatment plan. Reviewed the information documented and agree with the treatment plan. Corena Pilgrim, MD

## 2015-07-11 NOTE — Progress Notes (Signed)
Patient up this morning and visible in the milieu. Complaining of poor sleep and requesting that seroquel be restarted at a low dose. Reports it was helpful in the past however it caused weight gain. Feels a low dose might accomplish both issues. Rates his depression is a 1/10, hopelessness and anxiety both at a 2/10. Medicated per orders. Support, reassurance provided. No prn's given as patient denies pain or physical problems. He denies SI/HI and remains safe. Lawrence Marseilles

## 2015-07-11 NOTE — [facility] Group Notes (Signed)
[facility] Group Notes:  (Nursing/MHT/Case Management/Adjunct)  Date:  07/11/2015  Time:  1000  Type of Therapy:  Nurse Education  Participation Level:  Active  Participation Quality:  Attentive  Affect:  Appropriate  Cognitive:  Appropriate  Insight:  Good  Engagement in Group:  Engaged  Modes of Intervention:  Discussion, Education and Support  Summary of Progress/Problems: Patient attended and participated in group about SMART goal setting. States his goal for today is to get more sleep and move forward. Long term goal is to decrease hopelessness by hopefully finding alternate living arrangements.  Merian Capron [facility] 07/11/2015, 1045

## 2015-07-12 MED ORDER — AMLODIPINE BESYLATE 10 MG PO TABS: 10.0000 mg | ORAL_TABLET | Freq: Every day | ORAL | Status: DC

## 2015-07-12 MED ORDER — TAMSULOSIN HCL 0.4 MG PO CAPS: 0.4000 mg | ORAL_CAPSULE | Freq: Every day | ORAL | Status: DC

## 2015-07-12 MED ORDER — GABAPENTIN 400 MG PO CAPS
800.0000 mg | ORAL_CAPSULE | Freq: Every day | ORAL | Status: DC
Start: 2015-07-12 — End: 2015-08-25

## 2015-07-12 MED ORDER — LISINOPRIL 20 MG PO TABS
20.0000 mg | ORAL_TABLET | Freq: Every day | ORAL | Status: DC
Start: 2015-07-12 — End: 2015-09-02

## 2015-07-12 MED ORDER — ARIPIPRAZOLE 2 MG PO TABS: 2.0000 mg | ORAL_TABLET | Freq: Every day | ORAL | Status: DC

## 2015-07-12 MED ORDER — GABAPENTIN 400 MG PO CAPS: 400.0000 mg | ORAL_CAPSULE | Freq: Two times a day (BID) | ORAL | Status: DC

## 2015-07-12 MED ORDER — FLUOXETINE HCL 20 MG PO CAPS: 60.0000 mg | ORAL_CAPSULE | Freq: Every day | ORAL | Status: DC

## 2015-07-12 MED ORDER — PROPRANOLOL HCL 20 MG PO TABS: 20.0000 mg | ORAL_TABLET | Freq: Every day | ORAL | Status: DC

## 2015-07-12 MED ORDER — HYDROXYZINE HCL 25 MG PO TABS: 25.0000 mg | ORAL_TABLET | Freq: Four times a day (QID) | ORAL | Status: DC | PRN

## 2015-07-12 NOTE — Progress Notes (Signed)
  [facility] Adult Case Management Discharge Plan :  Will you be returning to the same living situation after discharge:  Yes,  to [facility], has a letter that will allow him to access bed during day At discharge, do you have transportation home?: Yes,  with friend Do you have the ability to pay for your medications: Yes,  prescriptions given, to receive help at [facility]  Release of information consent forms completed and in the chart;  Patient's signature needed at discharge.  Patient to Follow up at: Follow-up Information    Follow up with [facility] On 07/14/2015.   Specialty:  Behavioral Health   Why:  at 10:40am for medication management with Dr. Jillene Bucks.   Contact information:   [address] [place] [address] [phone]       Patient denies SI/HI: Yes,  see doctor note    Safety Planning and Suicide Prevention discussed: Yes,  with pt and with friend  Have you used any form of tobacco in the last 30 days? (Cigarettes, Smokeless Tobacco, Cigars, and/or Pipes): Yes  Has patient been referred to the Quitline?: N/A patient is not a smoker  Sarina Ser 07/12/2015, 1:17 PM

## 2015-07-12 NOTE — Progress Notes (Signed)
Orders received for patients discharge. Patient verbalized understanding of discharge plan, AVS reviewed at length and patient verbalized understanding of follow up care. Rx given with free supply of medications, as well as letter from SW, and a bus pass. Crisis services reviewed at length. Pt denies any SI/HI/A/V Hallucinations. No signs of acute decompensation. All belongings returned to the patient and patient escorted from unit to the lobby.

## 2015-07-12 NOTE — [facility] Group Notes (Signed)
[facility] Group Notes:  (Nursing/MHT/Case Management/Adjunct)  Date:  07/12/2015  Time:  10:47 AM  Type of Therapy:  Psychoeducational Skills  Participation Level:  Active  Participation Quality:  Appropriate  Affect:  Appropriate  Cognitive:  Appropriate  Insight:  Appropriate  Engagement in Group:  Engaged  Modes of Intervention:  Discussion and Education  Summary of Progress/Problems:  Zachary Russo 07/12/2015, 10:47 AM

## 2015-07-12 NOTE — [facility] Suicide Risk Assessment (Signed)
[facility] Discharge Suicide Risk Assessment   Demographic Factors:  Male, Caucasian, Low socioeconomic status, Unemployed and Homeless, live in the shelter  Total Time spent with patient: 30 minutes  Musculoskeletal: Strength & Muscle Tone: within normal limits Gait & Station: normal Patient leans: N/A  Psychiatric Specialty Exam: Physical Exam  Psychiatric: He has a normal mood and affect. His speech is normal and behavior is normal. Judgment and thought content normal. Cognition and memory are normal.    Review of Systems  Constitutional: Negative.   HENT: Negative.   Eyes: Negative.   Respiratory: Negative.   Cardiovascular: Negative.   Gastrointestinal: Negative.   Genitourinary: Negative.   Musculoskeletal: Negative.   Skin: Negative.   Neurological: Negative.   Endo/Heme/Allergies: Negative.   Psychiatric/Behavioral: Negative.     Blood pressure 131/98, pulse 93, temperature 98.3 F (36.8 C), temperature source Oral, resp. rate 18, height  (1.727 m), weight 78.019 kg (172 lb), SpO2 100 %.Body mass index is 26.16 kg/(m^2).  General Appearance: Fairly Groomed  Patent attorney::  Good  Speech:  Clear and Coherent409  Volume:  Normal  Mood:  Euthymic  Affect:  Appropriate  Thought Process:  Goal Directed and Linear  Orientation:  Full (Time, Place, and Person)  Thought Content:  Negative  Suicidal Thoughts:  No  Homicidal Thoughts:  No  Memory:  Immediate;   Good Recent;   Fair Remote;   Fair  Judgement:  Fair  Insight:  Fair  Psychomotor Activity:  Normal  Concentration:  Good  Recall:  Good  Fund of Knowledge:Good  Language: Good  Akathisia:  No  Handed:  Right  AIMS (if indicated):     Assets:  Communication Skills Desire for Improvement Physical Health  Sleep:  Number of Hours: 6.5  Cognition: WNL  ADL's:  Intact   Have you used any form of tobacco in the last 30 days? (Cigarettes, Smokeless Tobacco, Cigars, and/or Pipes): Yes  Has this patient used  any form of tobacco in the last 30 days? (Cigarettes, Smokeless Tobacco, Cigars, and/or Pipes) Yes, Prescription not provided because: patient refused  Mental Status Per Nursing Assessment::   On Admission:     Current Mental Status by Physician: patient denies suicidal ideation, intent or plan  Loss Factors: Financial problems/change in socioeconomic status  Historical Factors: NA  Risk Reduction Factors:   Sense of responsibility to family and Positive coping skills or problem solving skills  Continued Clinical Symptoms:  Resolving depression  Cognitive Features That Contribute To Risk:  None    Suicide Risk:  Minimal: No identifiable suicidal ideation.  Patients presenting with no risk factors but with morbid ruminations; may be classified as minimal risk based on the severity of the depressive symptoms  Principal Problem: Major depressive disorder, recurrent episode Discharge Diagnoses:  Patient Active Problem List   Diagnosis Date Noted  . Major depressive disorder, recurrent episode [F33.9] 07/07/2015  . Anxiety disorder [F41.9] 01/27/2015  . Adjustment disorder with depressed mood [F43.21] 01/18/2015  . Acute encephalopathy [G93.40] 12/18/2013  . Delirium [R41.0] 12/18/2013  . 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 Abnormal brain CT [R93.0] 12/18/2013    Follow-up Information    Follow up with [facility] On 07/14/2015.   Specialty:  Behavioral Health   Why:  at 10:40am for medication management with Dr. Jillene Bucks.   Contact information:   [address] [place] [address] [phone]       Plan Of Care/Follow-up recommendations:  Activity:  as tolerated Diet:  healthy  Is patient on multiple antipsychotic therapies at discharge:  No   Has Patient had three or more failed trials of antipsychotic monotherapy by history:  No  Recommended Plan for Multiple Antipsychotic Therapies: NA    Thedore Mins, MD 07/12/2015, 10:52 AM

## 2015-07-12 NOTE — Progress Notes (Signed)
[facility] Group Notes:  (Nursing/MHT/Case Management/Adjunct)  Date:  07/12/2015  Time:  12:02 AM  Type of Therapy:  Group Therapy  Participation Level:  Active  Participation Quality:  Appropriate  Affect:  Appropriate  Cognitive:  Appropriate  Insight:  Appropriate  Engagement in Group:  Engaged  Modes of Intervention:  Socialization and Support  Summary of Progress/Problems: Pt. Was engaged and participated in group discussion.  Pt. Stated he would exercise by walking more after discharged.  Sondra Come 07/12/2015, 12:02 AM

## 2015-07-13 NOTE — Clinical Social Work Note (Signed)
CSW received VM from Ebony Hail [phone]) re patient, says pt did not get bed at shelter and is in urgent need of housing.    Santa Genera, LCSW Clinical Social Worker
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
## 2015-07-16 ENCOUNTER — Emergency Department (HOSPITAL_COMMUNITY)
Admission: EM | Admit: 2015-07-16 | Discharge: 2015-07-17 | Disposition: A | Discharge status: Home / Self Care | Payer: Self-pay | Attending: Emergency Medicine | Admitting: Emergency Medicine

## 2015-07-16 DIAGNOSIS — G2581 Restless legs syndrome: Secondary | ICD-10-CM | POA: Insufficient documentation

## 2015-07-16 DIAGNOSIS — Z720 Tobacco use: Secondary | ICD-10-CM | POA: Insufficient documentation

## 2015-07-16 DIAGNOSIS — I10 Essential (primary) hypertension: Secondary | ICD-10-CM | POA: Insufficient documentation

## 2015-07-16 DIAGNOSIS — Z87438 Personal history of other diseases of male genital organs: Secondary | ICD-10-CM | POA: Insufficient documentation

## 2015-07-16 DIAGNOSIS — F329 Major depressive disorder, single episode, unspecified: Secondary | ICD-10-CM | POA: Insufficient documentation

## 2015-07-16 DIAGNOSIS — F419 Anxiety disorder, unspecified: Secondary | ICD-10-CM | POA: Insufficient documentation

## 2015-07-16 DIAGNOSIS — Z880 Allergy status to penicillin: Secondary | ICD-10-CM | POA: Insufficient documentation

## 2015-07-16 DIAGNOSIS — Z79899 Other long term (current) drug therapy: Secondary | ICD-10-CM | POA: Insufficient documentation

## 2015-07-17 ENCOUNTER — Encounter (HOSPITAL_COMMUNITY): Payer: Self-pay | Admitting: Emergency Medicine

## 2015-07-17 NOTE — ED Notes (Signed)
Pt arrived to the ED with a complaint of anxiety.  Pt states he is at the [facility] and that the music is loud and it causes him anxiety.  Pt denies he has any suicidal ideations, but if he stayed in that particular situation he would feel suicidal.  He wishes social work to write a Physicist, medical to the [employer] telling the management to uphold the rules.  Pt states he has Asperger's syndrome.  Pt states he has been compliant with his medications but forgot his Vistaril this evening.

## 2015-07-17 NOTE — Discharge Instructions (Signed)
Social Anxiety Disorder °Social anxiety disorder, previously called social phobia, is a mental disorder. People with social anxiety disorder frequently feel nervous, afraid, or embarrassed when around other people in social situations. They constantly worry that other people are judging or criticizing them for how they look, what they say, or how they act. They may worry that other people might reject them because of their appearance or behavior. °Social anxiety disorder is more than just occasional shyness or self-consciousness. It can cause severe emotional distress. It can interfere with daily life activities. Social anxiety disorder also may lead to excessive alcohol or drug use and even suicide.  °Social anxiety disorder is actually one of the most common mental disorders. It can develop at any time but usually starts in the teenage years. Women are more commonly affected than men. Social anxiety disorder is also more common in people who have family members with anxiety disorders. It also is more common in people who have physical deformities or conditions with characteristics that are obvious to others, such as stuttered speech or movement abnormalities (Parkinson disease).  °SYMPTOMS  °In addition to feeling anxious or fearful in social situations, people with social anxiety disorder frequently have physical symptoms. Examples include: °· Red face (blushing). °· Racing heart. °· Sweating. °· Shaky hands or voice. °· Confusion. °· Light-headedness. °· Upset stomach and diarrhea. °DIAGNOSIS  °Social anxiety disorder is diagnosed through an assessment by your health care provider. Your health care provider will ask you questions about your mood, thoughts, and reactions in social situations. Your health care provider may ask you about your medical history and use of alcohol or drugs, including prescription medicines. Certain medical conditions and the use of certain substances, including caffeine, can cause  symptoms similar to social anxiety disorder. Your health care provider may refer you to a mental health specialist for further evaluation or treatment. °The criteria for diagnosis of social anxiety disorder are: °· Marked fear or anxiety in one or more social situations in which you may be closely watched or studied by others. Examples of such situations include: °¨ Interacting socially (having a conversation with others, going to a party, or meeting strangers). °¨ Being observed (eating or drinking in public or being called on in class). °¨ Performing in front of others (giving a speech). °· The social situations of concern almost always cause fear or anxiety, not just occasionally. °· People with social anxiety disorder fear that they will be viewed negatively in a way that will be embarrassing, will lead to rejection, or will offend others. This fear is out of proportion to the actual threat posed by the social situation. °· Often the triggering social situations are avoided, or they are endured with intense fear or anxiety. The fear, anxiety, or avoidance is persistent and lasts for 6 months or longer. °· The anxiety causes difficulty functioning in at least some parts of your daily life. °TREATMENT  °Several types of treatment are available for social anxiety disorder. These treatments are often used in combination and include:  °· Talk therapy. Group talk therapy allows you to see that you are not alone with these problems. Individual talk therapy helps you address your specific anxiety issues with a caring professional. The most effective forms of talk therapy for social anxiety disorder are cognitive-behavioral therapy and exposure therapy. Cognitive-behavioral therapy helps you to identify and change negative thoughts and beliefs that are at the root of the disorder. Exposure therapy allows you to gradually face the situations   that you fear most.  Relaxation and coping techniques. These include deep  breathing, self-talk, meditation, visual imagery, and yoga. Relaxation techniques help to keep you calm in social situations.  Social Optician, dispensing.Social skills can be learned on your own or with the help of a talk therapist. They can help you feel more confident and comfortable in social situations.  Medicine. For anxiety limited to performance situations (performance anxiety), medicine called beta blockers can help by reducing or preventing the physical symptoms of social anxiety disorder. For more persistent and generalized social anxiety, antidepressant medicine may be prescribed to help control symptoms. In severe cases of social anxiety disorder, strong antianxiety medicine, called benzodiazepines, may be prescribed on a limited basis and for a short time. Document Released: 11/03/2005 Document Revised: 04/21/2014 Document Reviewed: 03/05/2013 [facility] Patient Information 2015 [place], [place]. This information is not intended to replace advice given to you by your health care provider. Make sure you discuss any questions you have with your health care provider. Follow up with your PCP as needed

## 2015-07-17 NOTE — ED Provider Notes (Signed)
CSN: [id number]     Arrival date & time 07/16/15  2246 History   First MD Initiated Contact with Patient 07/17/15 [phone]     Chief Complaint  Patient presents with  . Anxiety     (Consider location/radiation/quality/duration/timing/severity/associated sxs/prior Treatment) HPI Comments: This is a 54 year old male who lives at [facility].  He is awaiting placement at [facility]  He states he's had intermittent problems at [facility] with people playing.  He is at that is not to.  His taste.  He becomes very anxious tonight presents with increased anxiety due to environmental stimuli.  He is not suicidal or homicidal.  He states he's been taking his medications on a regular basis  Patient is a 54 y.o. male presenting with anxiety. The history is provided by the patient.  Anxiety This is a recurrent problem. The current episode started today. The problem occurs intermittently. The problem has been unchanged. Pertinent negatives include no abdominal pain, chills, fever or headaches. Nothing aggravates the symptoms. He has tried nothing for the symptoms.    Past Medical History  Diagnosis Date  . Hypertension   . Prostate disorder   . Anxiety   . Asperger syndrome   . H/O suicide attempt     overdose "many years ago"  . Restless leg syndrome   . Depression    History reviewed. No pertinent past surgical history. History reviewed. No pertinent family history. History  Substance Use Topics  . Smoking status: Current Every Day Smoker -- 2 years    Types: Cigars  . Smokeless tobacco: Never Used  . Alcohol Use: No    Review of Systems  Constitutional: Negative for fever and chills.  Gastrointestinal: Negative for abdominal pain.  Neurological: Negative for dizziness and headaches.  Psychiatric/Behavioral: Negative for behavioral problems and agitation. The patient is nervous/anxious.   All other systems reviewed and are negative.     Allergies   Amoxicillin; Cephalexin; and Codeine  Home Medications   Prior to Admission medications   Medication Sig Start Date End Date Taking? Authorizing Provider  amLODipine (NORVASC) 10 MG tablet Take 1 tablet (10 mg total) by mouth daily before supper. 07/12/15  Yes Adonis Brook, NP  ARIPiprazole (ABILIFY) 2 MG tablet Take 1 tablet (2 mg total) by mouth daily. 07/12/15  Yes Adonis Brook, NP  FLUoxetine (PROZAC) 20 MG capsule Take 3 capsules (60 mg total) by mouth daily before supper. 07/12/15  Yes Adonis Brook, NP  gabapentin (NEURONTIN) 400 MG capsule Take 1 capsule (400 mg total) by mouth 2 (two) times daily. 07/12/15  Yes Adonis Brook, NP  gabapentin (NEURONTIN) 400 MG capsule Take 2 capsules (800 mg total) by mouth at bedtime. 07/12/15  Yes Adonis Brook, NP  hydrOXYzine (ATARAX/VISTARIL) 25 MG tablet Take 1 tablet (25 mg total) by mouth every 6 (six) hours as needed for anxiety (insomnia). 07/12/15  Yes Adonis Brook, NP  lisinopril (PRINIVIL,ZESTRIL) 20 MG tablet Take 1 tablet (20 mg total) by mouth daily. 07/12/15  Yes Adonis Brook, NP  propranolol (INDERAL) 20 MG tablet Take 1 tablet (20 mg total) by mouth daily. 07/12/15  Yes Adonis Brook, NP  tamsulosin (FLOMAX) 0.4 MG CAPS capsule Take 1 capsule (0.4 mg total) by mouth daily after supper. 07/12/15  Yes Adonis Brook, NP   BP 134/86 mmHg  Pulse 116  Temp(Src) 98 F (36.7 C) (Oral)  Resp 18  Ht 5\' 8"  (1.727 m)  Wt 170 lb (77.111 kg)  BMI 25.85 kg/m2  SpO2 97% Physical Exam  Constitutional: He is oriented to person, place, and time. He appears well-developed and well-nourished.  HENT:  Head: Normocephalic.  Eyes: Pupils are equal, round, and reactive to light.  Neck: Normal range of motion.  Cardiovascular: Normal rate.   Pulmonary/Chest: Effort normal.  Musculoskeletal: Normal range of motion.  Neurological: He is alert and oriented to person, place, and time.  Skin: Skin is warm and dry.  Psychiatric: His speech is  normal and behavior is normal. His mood appears anxious.  Vitals reviewed.   ED Course  Procedures (including critical care time) Labs Review Labs Reviewed - No data to display  Imaging Review No results found.   EKG Interpretation None      MDM   Final diagnoses:  Anxiety         Earley Favor, NP 07/17/15 0347  Devoria Albe, MD 07/17/15 [phone]

## 2015-07-18 ENCOUNTER — Encounter (HOSPITAL_COMMUNITY): Payer: Self-pay | Admitting: Emergency Medicine

## 2015-07-18 ENCOUNTER — Emergency Department (HOSPITAL_COMMUNITY)
Admission: EM | Admit: 2015-07-18 | Discharge: 2015-07-18 | Disposition: A | Discharge status: Home / Self Care | Payer: Self-pay | Attending: Emergency Medicine | Admitting: Emergency Medicine

## 2015-07-18 ENCOUNTER — Emergency Department (HOSPITAL_COMMUNITY)
Admission: EM | Admit: 2015-07-18 | Discharge: 2015-07-20 | Disposition: A | Discharge status: Home / Self Care | Payer: Federal, State, Local not specified - Other | Attending: Emergency Medicine | Admitting: Emergency Medicine

## 2015-07-18 DIAGNOSIS — Z720 Tobacco use: Secondary | ICD-10-CM | POA: Insufficient documentation

## 2015-07-18 DIAGNOSIS — Z880 Allergy status to penicillin: Secondary | ICD-10-CM | POA: Insufficient documentation

## 2015-07-18 DIAGNOSIS — F1310 Sedative, hypnotic or anxiolytic abuse, uncomplicated: Secondary | ICD-10-CM | POA: Insufficient documentation

## 2015-07-18 DIAGNOSIS — F4321 Adjustment disorder with depressed mood: Secondary | ICD-10-CM | POA: Diagnosis present

## 2015-07-18 DIAGNOSIS — Z87438 Personal history of other diseases of male genital organs: Secondary | ICD-10-CM | POA: Insufficient documentation

## 2015-07-18 DIAGNOSIS — Z8669 Personal history of other diseases of the nervous system and sense organs: Secondary | ICD-10-CM | POA: Insufficient documentation

## 2015-07-18 DIAGNOSIS — I10 Essential (primary) hypertension: Secondary | ICD-10-CM | POA: Insufficient documentation

## 2015-07-18 DIAGNOSIS — F419 Anxiety disorder, unspecified: Secondary | ICD-10-CM | POA: Insufficient documentation

## 2015-07-18 DIAGNOSIS — Z79899 Other long term (current) drug therapy: Secondary | ICD-10-CM | POA: Insufficient documentation

## 2015-07-18 DIAGNOSIS — F329 Major depressive disorder, single episode, unspecified: Secondary | ICD-10-CM | POA: Insufficient documentation

## 2015-07-18 LAB — RAPID URINE DRUG SCREEN, HOSP PERFORMED
Amphetamines: NOT DETECTED
Amphetamines: NOT DETECTED
BENZODIAZEPINES: POSITIVE — AB
Barbiturates: NOT DETECTED
Barbiturates: NOT DETECTED
Benzodiazepines: POSITIVE — AB
COCAINE: NOT DETECTED
Cocaine: NOT DETECTED
Opiates: NOT DETECTED
Opiates: NOT DETECTED
TETRAHYDROCANNABINOL: NOT DETECTED
TETRAHYDROCANNABINOL: NOT DETECTED

## 2015-07-18 LAB — CBC
HCT: 41.7 % (ref 39.0–52.0)
HEMOGLOBIN: 14.2 g/dL (ref 13.0–17.0)
MCH: 30.4 pg (ref 26.0–34.0)
MCHC: 34.1 g/dL (ref 30.0–36.0)
MCV: 89.3 fL (ref 78.0–100.0)
Platelets: 231 10*3/uL (ref 150–400)
RBC: 4.67 MIL/uL (ref 4.22–5.81)
RDW: 13.9 % (ref 11.5–15.5)
WBC: 7.7 10*3/uL (ref 4.0–10.5)

## 2015-07-18 LAB — COMPREHENSIVE METABOLIC PANEL
ALBUMIN: 3.8 g/dL (ref 3.5–5.0)
ALT: 31 U/L (ref 17–63)
ANION GAP: 6 (ref 5–15)
AST: 22 U/L (ref 15–41)
Alkaline Phosphatase: 78 U/L (ref 38–126)
BUN: 17 mg/dL (ref 6–20)
CO2: 26 mmol/L (ref 22–32)
Calcium: 9.1 mg/dL (ref 8.9–10.3)
Chloride: 108 mmol/L (ref 101–111)
Creatinine, Ser: 1.09 mg/dL (ref 0.61–1.24)
GFR calc Af Amer: >60 mL/min (ref >60)
GFR calc non Af Amer: >60 mL/min (ref >60)
Glucose, Bld: 111 mg/dL — ABNORMAL HIGH (ref 65–99)
POTASSIUM: 3.9 mmol/L (ref 3.5–5.1)
Sodium: 140 mmol/L (ref 135–145)
TOTAL PROTEIN: 6.8 g/dL (ref 6.5–8.1)
Total Bilirubin: 0.3 mg/dL (ref 0.3–1.2)

## 2015-07-18 LAB — ETHANOL
Alcohol, Ethyl (B): <5 mg/dL (ref <5)
Alcohol, Ethyl (B): <5 mg/dL (ref <5)

## 2015-07-18 NOTE — ED Notes (Signed)
Pt in lobby stating that he left his headphones behind.  This RN and two others searched room and bags in trash.  No headphones found.
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
## 2015-07-18 NOTE — ED Notes (Signed)
Pt. To [facility] from ED ambulatory without difficulty, to room 37. Report from Britany RN. Pt. Is alert and oriented, warm and dry in no distress. Pt. Denies SI, HI, and AVH. Pt. Calm and cooperative. Pt. Made aware of security cameras and Q15 minute rounds. Pt. Encouraged to let Nursing staff know of any concerns or needs.  

## 2015-07-18 NOTE — ED Notes (Signed)
Pt. reports feeling depressed with suicidal ideation plans to overdose on his medications , no hallucinations .

## 2015-07-18 NOTE — ED Notes (Signed)
Security wanded pt ?
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
## 2015-07-18 NOTE — ED Notes (Signed)
Pt had a work up at this a.m at [facility]

## 2015-07-18 NOTE — ED Provider Notes (Signed)
CSN: [id number]     Arrival date & time 07/18/15  0127 History  This chart was scribed for Azalia Bilis, MD by Abel Presto, ED Scribe. This patient was seen in room D31C/D31C and the patient's care was started at 3:12 AM.    Chief Complaint  Patient presents with  . Suicidal     The history is provided by the patient. No language interpreter was used.   HPI Comments: Zachary Russo is a 54 y.o. male who presents to the Emergency Department with no specific medical complaints. Pt lives at [facility] and states his upstairs neighbors make too much noise and he is unable to get some rest. Pt called Crisis Control because of this. Pt was last seen at [facility] on 07/16/15 for anxiety.   Denies HI and SI at this time. Requests a sandwich  Past Medical History  Diagnosis Date  . Hypertension   . Prostate disorder   . Anxiety   . Asperger syndrome   . H/O suicide attempt     overdose "many years ago"  . Restless leg syndrome   . Depression    History reviewed. No pertinent past surgical history. No family history on file. History  Substance Use Topics  . Smoking status: Current Every Day Smoker -- 2 years    Types: Cigars  . Smokeless tobacco: Never Used  . Alcohol Use: No    Review of Systems A complete 10 system review of systems was obtained and all systems are negative except as noted in the HPI and PMH.     Allergies  Amoxicillin; Cephalexin; and Codeine  Home Medications   Prior to Admission medications   Medication Sig Start Date End Date Taking? Authorizing Provider  amLODipine (NORVASC) 10 MG tablet Take 1 tablet (10 mg total) by mouth daily before supper. 07/12/15  Yes Adonis Brook, NP  ARIPiprazole (ABILIFY) 2 MG tablet Take 1 tablet (2 mg total) by mouth daily. 07/12/15  Yes Adonis Brook, NP  FLUoxetine (PROZAC) 20 MG capsule Take 3 capsules (60 mg total) by mouth daily before supper. 07/12/15  Yes Adonis Brook, NP  gabapentin (NEURONTIN) 400 MG capsule Take 1  capsule (400 mg total) by mouth 2 (two) times daily. 07/12/15  Yes Adonis Brook, NP  gabapentin (NEURONTIN) 400 MG capsule Take 2 capsules (800 mg total) by mouth at bedtime. 07/12/15  Yes Adonis Brook, NP  hydrOXYzine (ATARAX/VISTARIL) 25 MG tablet Take 1 tablet (25 mg total) by mouth every 6 (six) hours as needed for anxiety (insomnia). 07/12/15  Yes Adonis Brook, NP  lisinopril (PRINIVIL,ZESTRIL) 20 MG tablet Take 1 tablet (20 mg total) by mouth daily. 07/12/15  Yes Adonis Brook, NP  propranolol (INDERAL) 20 MG tablet Take 1 tablet (20 mg total) by mouth daily. 07/12/15  Yes Adonis Brook, NP  tamsulosin (FLOMAX) 0.4 MG CAPS capsule Take 1 capsule (0.4 mg total) by mouth daily after supper. 07/12/15  Yes Adonis Brook, NP   BP 115/84 mmHg  Pulse 90  Temp(Src) 97.4 F (36.3 C) (Oral)  Resp 22  Wt 174 lb 3.2 oz (79.017 kg)  SpO2 95% Physical Exam  Constitutional: He is oriented to person, place, and time. He appears well-developed and well-nourished.  HENT:  Head: Normocephalic and atraumatic.  Eyes: EOM are normal.  Neck: Normal range of motion.  Cardiovascular: Normal rate, regular rhythm, normal heart sounds and intact distal pulses.   Pulmonary/Chest: Effort normal and breath sounds normal. No respiratory distress.  Abdominal: Soft. He exhibits  no distension. There is no tenderness.  Musculoskeletal: Normal range of motion.  Neurological: He is alert and oriented to person, place, and time.  Skin: Skin is warm and dry.  Psychiatric: He has a normal mood and affect. Judgment normal.  Nursing note and vitals reviewed.   ED Course  Procedures (including critical care time) DIAGNOSTIC STUDIES: Oxygen Saturation is 95% on room air, normal by my interpretation.    COORDINATION OF CARE: 3:17 AM Discussed treatment plan with patient at beside, the patient agrees with the plan and has no further questions at this time.   Labs Review Labs Reviewed  COMPREHENSIVE METABOLIC PANEL  - Abnormal; Notable for the following:    Glucose, Bld 111 (*)    All other components within normal limits  URINE RAPID DRUG SCREEN, HOSP PERFORMED - Abnormal; Notable for the following:    Benzodiazepines POSITIVE (*)    All other components within normal limits  ETHANOL  CBC    Imaging Review No results found.   EKG Interpretation None      MDM   Final diagnoses:  Anxiety disorder, unspecified anxiety disorder type    MSE completed. No HI or SI. Dc back home   I personally performed the services described in this documentation, which was scribed in my presence. The recorded information has been reviewed and is accurate.        Azalia Bilis, MD 07/18/15 [phone]
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
## 2015-07-18 NOTE — ED Notes (Signed)
At this time pt refusing to leave the room. Wants help to find somewhere else to live. Primary RN aware and speaking with pt and made Charge RN aware of situation.

## 2015-07-18 NOTE — BH Assessment (Signed)
Assessment completed. Consulted Charles Kober, PA-C who recommended inpatient treatment. TTS will contact other facilities for placement. Dr. Plunkett has been informed of the recommendation. 

## 2015-07-18 NOTE — ED Notes (Signed)
Sandwich and soft drink given.  

## 2015-07-18 NOTE — ED Notes (Signed)
Pt discharged to the lobby at this time. Will wait for the buses to run.
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
## 2015-07-18 NOTE — ED Notes (Signed)
Pt. Noted sleeping in room. No complaints or concerns voiced. No distress or abnormal behavior noted. Will continue to monitor with security cameras. Q 15 minute rounds continue. 

## 2015-07-19 DIAGNOSIS — F332 Major depressive disorder, recurrent severe without psychotic features: Secondary | ICD-10-CM | POA: Diagnosis not present

## 2015-07-19 DIAGNOSIS — F4321 Adjustment disorder with depressed mood: Secondary | ICD-10-CM | POA: Diagnosis not present

## 2015-07-19 DIAGNOSIS — R45851 Suicidal ideations: Secondary | ICD-10-CM | POA: Diagnosis not present

## 2015-07-19 MED ORDER — GABAPENTIN 400 MG PO CAPS
400.0000 mg | ORAL_CAPSULE | Freq: Two times a day (BID) | ORAL | Status: DC
  Administered 2015-07-19 – 2015-07-20 (×2): 400 mg via ORAL
  Filled 2015-07-19: qty 1

## 2015-07-19 MED ORDER — CLONAZEPAM 1 MG PO TABS
1.0000 mg | ORAL_TABLET | Freq: Once | ORAL | Status: AC
  Administered 2015-07-19: 1 mg via ORAL
  Filled 2015-07-19: qty 1

## 2015-07-19 MED ORDER — FLUOXETINE HCL 20 MG PO CAPS
60.0000 mg | ORAL_CAPSULE | Freq: Every day | ORAL | Status: DC
  Administered 2015-07-19: 60 mg via ORAL
  Filled 2015-07-19 (×2): qty 3

## 2015-07-19 MED ORDER — TAMSULOSIN HCL 0.4 MG PO CAPS
0.4000 mg | ORAL_CAPSULE | Freq: Every day | ORAL | Status: DC
  Administered 2015-07-19: 0.4 mg via ORAL
  Filled 2015-07-19 (×2): qty 1

## 2015-07-19 MED ORDER — GABAPENTIN 400 MG PO CAPS
800.0000 mg | ORAL_CAPSULE | Freq: Every day | ORAL | Status: DC
  Administered 2015-07-19: 800 mg via ORAL
  Filled 2015-07-19: qty 2

## 2015-07-19 MED ORDER — ARIPIPRAZOLE 2 MG PO TABS
2.0000 mg | ORAL_TABLET | Freq: Every day | ORAL | Status: DC
  Administered 2015-07-19 – 2015-07-20 (×2): 2 mg via ORAL
  Filled 2015-07-19 (×2): qty 1

## 2015-07-19 MED ORDER — ACETAMINOPHEN 325 MG PO TABS
650.0000 mg | ORAL_TABLET | Freq: Four times a day (QID) | ORAL | Status: DC | PRN
  Administered 2015-07-19: 650 mg via ORAL
  Filled 2015-07-19: qty 2

## 2015-07-19 MED ORDER — QUETIAPINE FUMARATE 100 MG PO TABS
200.0000 mg | ORAL_TABLET | Freq: Every day | ORAL | Status: DC
  Administered 2015-07-19: 200 mg via ORAL
  Filled 2015-07-19: qty 2

## 2015-07-19 MED ORDER — LISINOPRIL 20 MG PO TABS
20.0000 mg | ORAL_TABLET | Freq: Every day | ORAL | Status: DC
  Administered 2015-07-19 – 2015-07-20 (×2): 20 mg via ORAL
  Filled 2015-07-19 (×2): qty 1

## 2015-07-19 MED ORDER — HYDROXYZINE HCL 25 MG PO TABS
25.0000 mg | ORAL_TABLET | Freq: Four times a day (QID) | ORAL | Status: DC | PRN
  Administered 2015-07-19: 25 mg via ORAL
  Filled 2015-07-19: qty 1

## 2015-07-19 MED ORDER — AMLODIPINE BESYLATE 10 MG PO TABS
10.0000 mg | ORAL_TABLET | Freq: Every day | ORAL | Status: DC
  Administered 2015-07-19: 10 mg via ORAL
  Filled 2015-07-19 (×2): qty 1

## 2015-07-19 MED ORDER — PROPRANOLOL HCL 20 MG PO TABS
20.0000 mg | ORAL_TABLET | Freq: Every day | ORAL | Status: DC
  Administered 2015-07-19 – 2015-07-20 (×2): 20 mg via ORAL
  Filled 2015-07-19 (×2): qty 1

## 2015-07-19 NOTE — ED Notes (Signed)
Pt. Noted in room. No complaints or concerns voiced. No distress or abnormal behavior noted. Will continue to monitor with security cameras. Q 15 minute rounds continue. 

## 2015-07-19 NOTE — ED Notes (Signed)
Pt. Noted sleeping in room. No complaints or concerns voiced. No distress or abnormal behavior noted. Will continue to monitor with security cameras. Q 15 minute rounds continue. 

## 2015-07-19 NOTE — ED Provider Notes (Signed)
He is requesting HS Seroquel and Klonipin for sleep. Will give 1 time dose and have Psychiatry determine if it should be continued.  Mancel Bale, MD 07/19/15 [phone]
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
## 2015-07-19 NOTE — ED Notes (Signed)
On the phone 

## 2015-07-19 NOTE — ED Notes (Addendum)
Pt reports that he is also on  Clonidine patch for his HTN--0.1mg  weekly--pt has patch on and reports it should have been changed yesterday

## 2015-07-19 NOTE — ED Notes (Signed)
Report received from Janie Rambo RN. Pt. Alert and oriented in no distress denies SI, HI, AVH and pain.  Pt. Instructed to come to me with problems or concerns.Will continue to monitor for safety via security cameras and Q 15 minute checks. 

## 2015-07-19 NOTE — BH Assessment (Signed)
Pt has been referred to the following facilities for placement:   [facility]

## 2015-07-19 NOTE — Progress Notes (Signed)
Disposition CSW completed referrals to [facility] and [facility] for inpatient psych due to Asperger's diagnosis.  [facility] declined due to abnormal head CT and [facility] declined due to Asperger's diagnosis.  Seward Speck [facility] Behavioral Health Disposition CSW [phone]

## 2015-07-19 NOTE — ED Notes (Signed)
Visitor into see 

## 2015-07-20 DIAGNOSIS — F4321 Adjustment disorder with depressed mood: Secondary | ICD-10-CM

## 2015-07-20 NOTE — BHH Suicide Risk Assessment (Signed)
Suicide Risk Assessment  Discharge Assessment   [facility] Discharge Suicide Risk Assessment   Demographic Factors:  Male and Caucasian  Total Time spent with patient: 30 minutes  Musculoskeletal: Strength & Muscle Tone: within normal limits Gait & Station: normal Patient leans: N/A  Psychiatric Specialty Exam: Physical Exam  Review of Systems  Constitutional: Negative.   HENT: Negative.   Eyes: Negative.   Respiratory: Negative.   Cardiovascular: Negative.   Gastrointestinal: Negative.   Genitourinary: Negative.   Musculoskeletal: Negative.   Skin: Negative.   Neurological: Negative.   Endo/Heme/Allergies: Negative.   Psychiatric/Behavioral:       None    Blood pressure 120/80, pulse 85, temperature 97.8 F (36.6 C), temperature source Oral, resp. rate 18, height  (1.727 m), weight 78.926 kg (174 lb), SpO2 97 %.Body mass index is 26.46 kg/(m^2).  General Appearance: Casual  Eye Contact::  Good  Speech:  Normal Rate  Volume:  Normal  Mood:  Euthymic  Affect:  Congruent  Thought Process:  Coherent  Orientation:  Full (Time, Place, and Person)  Thought Content:  WDL  Suicidal Thoughts:  No  Homicidal Thoughts:  No  Memory:  Immediate;   Good Recent;   Good Remote;   Good  Judgement:  Fair  Insight:  Good  Psychomotor Activity:  Normal  Concentration:  Good  Recall:  Good  Fund of Knowledge:Good  Language: Good  Akathisia:  No  Handed:  Right  AIMS (if indicated):     Assets:  Leisure Time Physical Health Resilience  ADL's:  Intact  Cognition: WNL  Sleep:        Has this patient used any form of tobacco in the last 30 days? (Cigarettes, Smokeless Tobacco, Cigars, and/or Pipes) No  Mental Status Per Nursing Assessment::   On Admission:   Depression  Current Mental Status by Physician: NA  Loss Factors: NA  Historical Factors: NA  Risk Reduction Factors:   Positive coping skills or problem solving skills  Continued Clinical Symptoms:   None  Cognitive Features That Contribute To Risk:  None    Suicide Risk:  Minimal: No identifiable suicidal ideation.  Patients presenting with no risk factors but with morbid ruminations; may be classified as minimal risk based on the severity of the depressive symptoms  Principal Problem: Adjustment disorder with depressed mood Discharge Diagnoses:  Patient Active Problem List   Diagnosis Date Noted  . Adjustment disorder with depressed mood [F43.21] 01/18/2015    Priority: High  . MDD (major depressive disorder), recurrent severe, without psychosis [F33.2] 07/19/2015    Priority: Low  . Major depressive disorder, recurrent episode [F33.9] 07/07/2015  . Anxiety disorder [F41.9] 01/27/2015  . Acute encephalopathy [G93.40] 12/18/2013  . Delirium [R41.0] 12/18/2013  . 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 Abnormal brain CT [R93.0] 12/18/2013      Plan Of Care/Follow-up recommendations:  Activity:  as tolerated Diet:  heart healthy diet  Is patient on multiple antipsychotic therapies at discharge:  No   Has Patient had three or more failed trials of antipsychotic monotherapy by history:  No  Recommended Plan for Multiple Antipsychotic Therapies: NA    LORD, JAMISON, PMH-NP 07/20/2015, 1:25 PM
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
## 2015-07-20 NOTE — ED Notes (Signed)
Pt. Noted sleeping in room. No complaints or concerns voiced. No distress or abnormal behavior noted. Will continue to monitor with security cameras. Q 15 minute rounds continue. 

## 2015-08-13 ENCOUNTER — Emergency Department (HOSPITAL_COMMUNITY)
Admission: EM | Admit: 2015-08-13 | Discharge: 2015-08-13 | Disposition: A | Discharge status: Home / Self Care | Payer: Self-pay | Attending: Emergency Medicine | Admitting: Emergency Medicine

## 2015-08-13 ENCOUNTER — Encounter (HOSPITAL_COMMUNITY): Payer: Self-pay | Admitting: Emergency Medicine

## 2015-08-13 DIAGNOSIS — Z720 Tobacco use: Secondary | ICD-10-CM | POA: Insufficient documentation

## 2015-08-13 DIAGNOSIS — Z79899 Other long term (current) drug therapy: Secondary | ICD-10-CM | POA: Insufficient documentation

## 2015-08-13 DIAGNOSIS — F329 Major depressive disorder, single episode, unspecified: Secondary | ICD-10-CM | POA: Insufficient documentation

## 2015-08-13 DIAGNOSIS — F419 Anxiety disorder, unspecified: Secondary | ICD-10-CM | POA: Insufficient documentation

## 2015-08-13 DIAGNOSIS — I10 Essential (primary) hypertension: Secondary | ICD-10-CM | POA: Insufficient documentation

## 2015-08-13 DIAGNOSIS — F332 Major depressive disorder, recurrent severe without psychotic features: Secondary | ICD-10-CM

## 2015-08-13 DIAGNOSIS — Z1210 Encounter for screening for malignant neoplasm of intestinal tract, unspecified: Secondary | ICD-10-CM | POA: Insufficient documentation

## 2015-08-13 LAB — COMPREHENSIVE METABOLIC PANEL
ALBUMIN: 3.9 g/dL (ref 3.5–5.0)
ALT: 35 U/L (ref 17–63)
AST: 26 U/L (ref 15–41)
Alkaline Phosphatase: 86 U/L (ref 38–126)
Anion gap: 8 (ref 5–15)
BILIRUBIN TOTAL: 0.3 mg/dL (ref 0.3–1.2)
BUN: 16 mg/dL (ref 6–20)
CHLORIDE: 102 mmol/L (ref 101–111)
CO2: 29 mmol/L (ref 22–32)
Calcium: 9.1 mg/dL (ref 8.9–10.3)
Creatinine, Ser: 1.07 mg/dL (ref 0.61–1.24)
GFR calc Af Amer: >60 mL/min (ref >60)
GFR calc non Af Amer: >60 mL/min (ref >60)
GLUCOSE: 98 mg/dL (ref 65–99)
POTASSIUM: 4.2 mmol/L (ref 3.5–5.1)
Sodium: 139 mmol/L (ref 135–145)
Total Protein: 6.6 g/dL (ref 6.5–8.1)

## 2015-08-13 LAB — CBC
HEMATOCRIT: 42.3 % (ref 39.0–52.0)
Hemoglobin: 14.1 g/dL (ref 13.0–17.0)
MCH: 30 pg (ref 26.0–34.0)
MCHC: 33.3 g/dL (ref 30.0–36.0)
MCV: 90 fL (ref 78.0–100.0)
Platelets: 236 10*3/uL (ref 150–400)
RBC: 4.7 MIL/uL (ref 4.22–5.81)
RDW: 14.2 % (ref 11.5–15.5)
WBC: 7.3 10*3/uL (ref 4.0–10.5)

## 2015-08-13 LAB — RAPID URINE DRUG SCREEN, HOSP PERFORMED
AMPHETAMINES: NOT DETECTED
BARBITURATES: NOT DETECTED
BENZODIAZEPINES: NOT DETECTED
Cocaine: NOT DETECTED
Opiates: NOT DETECTED
TETRAHYDROCANNABINOL: POSITIVE — AB

## 2015-08-13 LAB — SALICYLATE LEVEL: Salicylate Lvl: <4 mg/dL (ref 2.8–30.0)

## 2015-08-13 LAB — ETHANOL

## 2015-08-13 LAB — ACETAMINOPHEN LEVEL: Acetaminophen (Tylenol), Serum: <10 ug/mL — ABNORMAL LOW (ref 10–30)

## 2015-08-13 MED ORDER — ARIPIPRAZOLE 2 MG PO TABS
2.0000 mg | ORAL_TABLET | Freq: Every day | ORAL | Status: DC
  Administered 2015-08-13: 2 mg via ORAL
  Filled 2015-08-13: qty 1

## 2015-08-13 MED ORDER — FLUOXETINE HCL 20 MG PO CAPS
60.0000 mg | ORAL_CAPSULE | Freq: Every day | ORAL | Status: DC
Start: 2015-08-13 — End: 2015-08-13
  Filled 2015-08-13: qty 3

## 2015-08-13 MED ORDER — AMLODIPINE BESYLATE 5 MG PO TABS: 10.0000 mg | ORAL_TABLET | Freq: Every day | ORAL | Status: DC

## 2015-08-13 MED ORDER — GABAPENTIN 400 MG PO CAPS
400.0000 mg | ORAL_CAPSULE | Freq: Two times a day (BID) | ORAL | Status: DC
  Administered 2015-08-13 (×2): 400 mg via ORAL
  Filled 2015-08-13 (×2): qty 1

## 2015-08-13 MED ORDER — PROPRANOLOL HCL 20 MG PO TABS
20.0000 mg | ORAL_TABLET | Freq: Every day | ORAL | Status: DC
  Administered 2015-08-13: 20 mg via ORAL
  Filled 2015-08-13: qty 1

## 2015-08-13 MED ORDER — HYDROXYZINE HCL 25 MG PO TABS: 25.0000 mg | ORAL_TABLET | Freq: Four times a day (QID) | ORAL | Status: DC | PRN

## 2015-08-13 MED ORDER — LISINOPRIL 20 MG PO TABS
20.0000 mg | ORAL_TABLET | Freq: Every day | ORAL | Status: DC
  Administered 2015-08-13: 20 mg via ORAL
  Filled 2015-08-13: qty 1

## 2015-08-13 MED ORDER — TAMSULOSIN HCL 0.4 MG PO CAPS: 0.4000 mg | ORAL_CAPSULE | Freq: Every day | ORAL | Status: DC

## 2015-08-13 MED ORDER — GABAPENTIN 400 MG PO CAPS
800.0000 mg | ORAL_CAPSULE | Freq: Every day | ORAL | Status: DC
  Filled 2015-08-13: qty 2
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
## 2015-08-13 NOTE — ED Notes (Signed)
Mary at [facility] advised pt next in line for TTS.
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
## 2015-08-13 NOTE — Discharge Instructions (Signed)
Major Depressive Disorder °Major depressive disorder is a mental illness. It also may be called clinical depression or unipolar depression. Major depressive disorder usually causes feelings of sadness, hopelessness, or helplessness. Some people with this disorder do not feel particularly sad but lose interest in doing things they used to enjoy (anhedonia). Major depressive disorder also can cause physical symptoms. It can interfere with work, school, relationships, and other normal everyday activities. The disorder varies in severity but is longer lasting and more serious than the sadness we all feel from time to time in our lives. °Major depressive disorder often is triggered by stressful life events or major life changes. Examples of these triggers include divorce, loss of your job or home, a move, and the death of a family member or close friend. Sometimes this disorder occurs for no obvious reason at all. People who have family members with major depressive disorder or bipolar disorder are at higher risk for developing this disorder, with or without life stressors. Major depressive disorder can occur at any age. It may occur just once in your life (single episode major depressive disorder). It may occur multiple times (recurrent major depressive disorder). °SYMPTOMS °People with major depressive disorder have either anhedonia or depressed mood on nearly a daily basis for at least 2 weeks or longer. Symptoms of depressed mood include: °· Feelings of sadness (blue or down in the dumps) or emptiness. °· Feelings of hopelessness or helplessness. °· Tearfulness or episodes of crying (may be observed by others). °· Irritability (children and adolescents). °In addition to depressed mood or anhedonia or both, people with this disorder have at least four of the following symptoms: °· Difficulty sleeping or sleeping too much.   °· Significant change (increase or decrease) in appetite or weight.   °· Lack of energy or  motivation. °· Feelings of guilt and worthlessness.   °· Difficulty concentrating, remembering, or making decisions. °· Unusually slow movement (psychomotor retardation) or restlessness (as observed by others).   °· Recurrent wishes for death, recurrent thoughts of self-harm (suicide), or a suicide attempt. °People with major depressive disorder commonly have persistent negative thoughts about themselves, other people, and the world. People with severe major depressive disorder may experience distorted beliefs or perceptions about the world (psychotic delusions). They also may see or hear things that are not real (psychotic hallucinations). °DIAGNOSIS °Major depressive disorder is diagnosed through an assessment by your health care provider. Your health care provider will ask about aspects of your daily life, such as mood, sleep, and appetite, to see if you have the diagnostic symptoms of major depressive disorder. Your health care provider may ask about your medical history and use of alcohol or drugs, including prescription medicines. Your health care provider also may do a physical exam and blood work. This is because certain medical conditions and the use of certain substances can cause major depressive disorder-like symptoms (secondary depression). Your health care provider also may refer you to a mental health specialist for further evaluation and treatment. °TREATMENT °It is important to recognize the symptoms of major depressive disorder and seek treatment. The following treatments can be prescribed for this disorder:   °· Medicine. Antidepressant medicines usually are prescribed. Antidepressant medicines are thought to correct chemical imbalances in the brain that are commonly associated with major depressive disorder. Other types of medicine may be added if the symptoms do not respond to antidepressant medicines alone or if psychotic delusions or hallucinations occur. °· Talk therapy. Talk therapy can be  helpful in treating major depressive disorder by providing   support, education, and guidance. Certain types of talk therapy also can help with negative thinking (cognitive behavioral therapy) and with relationship issues that trigger this disorder (interpersonal therapy). °A mental health specialist can help determine which treatment is best for you. Most people with major depressive disorder do well with a combination of medicine and talk therapy. Treatments involving electrical stimulation of the brain can be used in situations with extremely severe symptoms or when medicine and talk therapy do not work over time. These treatments include electroconvulsive therapy, transcranial magnetic stimulation, and vagal nerve stimulation. °Document Released: 04/01/2013 Document Revised: 04/21/2014 Document Reviewed: 04/01/2013 °ExitCare® Patient Information ©2015 [employer]. This information is not intended to replace advice given to you by your health care provider. Make sure you discuss any questions you have with your health care provider. ° °

## 2015-08-13 NOTE — ED Notes (Signed)
Patient belongings and valuables returned to patient, patient signed valuables and belongings release form and confirmed all belongings and valuables were present.

## 2015-08-13 NOTE — ED Provider Notes (Signed)
10:08 AM Psychiatry has evaluated patient and feel he is stable for discharge. I agree. On my exam he states he has been depressed but denies SI or plan. Wants to go home. Feels like his new meds are improving his symptoms. Plan to follow closely with his psychologist/psychiatrist and discussed strict return precautions.  Pricilla Loveless, MD 08/13/15 1009

## 2015-08-13 NOTE — ED Notes (Signed)
C/o suicidal ideation with a plan to overdose on inderal.

## 2015-08-13 NOTE — ED Notes (Signed)
Belenda Cruise from [facility] called to advise that patient can be discharged as long as he has follow up care in place. Belenda Cruise requested that patient sign release of information form and form be faxed to her.

## 2015-08-13 NOTE — BH Assessment (Signed)
[facility] Assessment Progress Note  Pt just came on shift at 7 am and was informed of this pt needing an assessment.  Called and scheduled pt's tele assessment with this clinician as well as gathered clinical information from EDP [place].  Casimer Lanius, MS, [facility] Therapeutic Triage Specialist [facility]

## 2015-08-13 NOTE — ED Provider Notes (Signed)
CSN: [id number]     Arrival date & time 08/13/15  0027 History  This chart was scribed for Zachary Severin, MD by Doreatha Martin, ED Scribe. This patient was seen in room C24C/C24C and the patient's care was started at 2:22 AM.     Chief Complaint  Patient presents with  . Suicidal   The history is provided by the patient. No language interpreter was used.    HPI Comments: Zachary Russo is a 54 y.o. male with Hx of HTN, prostate disorder, anxiety, Asperger syndrome, depression who presents to the Emergency Department complaining of moderate, gradually worsening feelings of depression and hopelessness onset 2 months ago. He denies specific SI, but notes that he has lost a will to live. He notes that he does have a backup plan "in the back of his mind" to overdose on Inderal, but states he has no desire to follow through at this time. Pt states that he was supposed to hear from the [facility] yesterday and did not get a call. He is looking to relocate and was disappointed when he wasn't called. Pt is followed by a psychiatrist, with his last visit 3 days ago. Pt was seen in the ED for SI on 07/18/15, 07/17/15. He is also taking Clonidine, Lisinopril, Flomax, Gabapentin, Seroquel, Vistaril.   Past Medical History  Diagnosis Date  . Hypertension   . Prostate disorder   . Anxiety   . Asperger syndrome   . H/O suicide attempt     overdose "many years ago"  . Restless leg syndrome   . Depression    History reviewed. No pertinent past surgical history. No family history on file. Social History  Substance Use Topics  . Smoking status: Current Every Day Smoker -- 2 years    Types: Cigars  . Smokeless tobacco: Never Used  . Alcohol Use: No    Review of Systems  Psychiatric/Behavioral: Positive for suicidal ideas and dysphoric mood.  All other systems reviewed and are negative.   Allergies  Amoxicillin; Cephalexin; and Codeine  Home Medications   Prior to Admission medications   Medication Sig  Start Date End Date Taking? Authorizing Provider  amLODipine (NORVASC) 10 MG tablet Take 1 tablet (10 mg total) by mouth daily before supper. 07/12/15   Adonis Brook, NP  ARIPiprazole (ABILIFY) 2 MG tablet Take 1 tablet (2 mg total) by mouth daily. 07/12/15   Adonis Brook, NP  FLUoxetine (PROZAC) 20 MG capsule Take 3 capsules (60 mg total) by mouth daily before supper. 07/12/15   Adonis Brook, NP  gabapentin (NEURONTIN) 400 MG capsule Take 1 capsule (400 mg total) by mouth 2 (two) times daily. 07/12/15   Adonis Brook, NP  gabapentin (NEURONTIN) 400 MG capsule Take 2 capsules (800 mg total) by mouth at bedtime. 07/12/15   Adonis Brook, NP  hydrOXYzine (ATARAX/VISTARIL) 25 MG tablet Take 1 tablet (25 mg total) by mouth every 6 (six) hours as needed for anxiety (insomnia). 07/12/15   Adonis Brook, NP  lisinopril (PRINIVIL,ZESTRIL) 20 MG tablet Take 1 tablet (20 mg total) by mouth daily. 07/12/15   Adonis Brook, NP  propranolol (INDERAL) 20 MG tablet Take 1 tablet (20 mg total) by mouth daily. 07/12/15   Adonis Brook, NP  tamsulosin (FLOMAX) 0.4 MG CAPS capsule Take 1 capsule (0.4 mg total) by mouth daily after supper. 07/12/15   Adonis Brook, NP   BP 111/82 mmHg  Pulse 84  Temp(Src) 98.1 F (36.7 C) (Oral)  Resp 20  SpO2 94%  Physical Exam  Constitutional: He is oriented to person, place, and time. He appears well-developed and well-nourished.  HENT:  Head: Normocephalic and atraumatic.  Nose: Nose normal.  Mouth/Throat: Oropharynx is clear and moist.  Eyes: Conjunctivae and EOM are normal. Pupils are equal, round, and reactive to light.  Neck: Normal range of motion. Neck supple. No JVD present. No tracheal deviation present. No thyromegaly present.  Cardiovascular: Normal rate, regular rhythm, normal heart sounds and intact distal pulses.  Exam reveals no gallop and no friction rub.   No murmur heard. Pulmonary/Chest: Effort normal and breath sounds normal. No stridor. No  respiratory distress. He has no wheezes. He has no rales. He exhibits no tenderness.  Abdominal: Soft. Bowel sounds are normal. He exhibits no distension and no mass. There is no tenderness. There is no rebound and no guarding.  Musculoskeletal: Normal range of motion. He exhibits no edema or tenderness.  Lymphadenopathy:    He has no cervical adenopathy.  Neurological: He is alert and oriented to person, place, and time. He displays normal reflexes. He exhibits normal muscle tone. Coordination normal.  Skin: Skin is warm and dry. No rash noted. No erythema. No pallor.  Psychiatric: He has a normal mood and affect. His behavior is normal. Judgment and thought content normal.  Nursing note and vitals reviewed.   ED Course  Procedures (including critical care time) DIAGNOSTIC STUDIES: Oxygen Saturation is 94% on RA, adequate by my interpretation.    COORDINATION OF CARE: 2:28 AM Discussed treatment plan with pt at bedside and pt agreed to plan.   Labs Review Labs Reviewed  ACETAMINOPHEN LEVEL - Abnormal; Notable for the following:    Acetaminophen (Tylenol), Serum <10 (*)    All other components within normal limits  URINE RAPID DRUG SCREEN, HOSP PERFORMED - Abnormal; Notable for the following:    Tetrahydrocannabinol POSITIVE (*)    All other components within normal limits  COMPREHENSIVE METABOLIC PANEL  ETHANOL  SALICYLATE LEVEL  CBC    Imaging Review No results found. I have personally reviewed and evaluated these images and lab results as part of my medical decision-making.   EKG Interpretation None      MDM   Final diagnoses:  MDD (major depressive disorder), recurrent severe, without psychosis    I personally performed the services described in this documentation, which was scribed in my presence. The recorded information has been reviewed and is accurate.  54 year old with long-standing depression which is worsened.  He has some suicidal ideation with plan, but  reports he is not actively pursuing it.  Will have him seen by TTS.  Patient is medically clear.  Zachary Severin, MD 08/13/15 [phone]

## 2015-08-13 NOTE — ED Notes (Signed)
TTS machine placed at bedside.   

## 2015-08-16 ENCOUNTER — Encounter (HOSPITAL_COMMUNITY): Payer: Self-pay | Admitting: Emergency Medicine

## 2015-08-16 ENCOUNTER — Emergency Department (HOSPITAL_COMMUNITY): Payer: Self-pay

## 2015-08-16 ENCOUNTER — Emergency Department (HOSPITAL_COMMUNITY)
Admission: EM | Admit: 2015-08-16 | Discharge: 2015-08-16 | Disposition: A | Discharge status: Home / Self Care | Payer: Self-pay | Attending: Emergency Medicine | Admitting: Emergency Medicine

## 2015-08-16 DIAGNOSIS — Z720 Tobacco use: Secondary | ICD-10-CM | POA: Insufficient documentation

## 2015-08-16 DIAGNOSIS — I861 Scrotal varices: Secondary | ICD-10-CM | POA: Insufficient documentation

## 2015-08-16 DIAGNOSIS — F419 Anxiety disorder, unspecified: Secondary | ICD-10-CM | POA: Insufficient documentation

## 2015-08-16 DIAGNOSIS — G2581 Restless legs syndrome: Secondary | ICD-10-CM | POA: Insufficient documentation

## 2015-08-16 DIAGNOSIS — N400 Enlarged prostate without lower urinary tract symptoms: Secondary | ICD-10-CM | POA: Insufficient documentation

## 2015-08-16 DIAGNOSIS — I10 Essential (primary) hypertension: Secondary | ICD-10-CM | POA: Insufficient documentation

## 2015-08-16 DIAGNOSIS — Z79899 Other long term (current) drug therapy: Secondary | ICD-10-CM | POA: Insufficient documentation

## 2015-08-16 DIAGNOSIS — F329 Major depressive disorder, single episode, unspecified: Secondary | ICD-10-CM | POA: Insufficient documentation

## 2015-08-16 DIAGNOSIS — Z880 Allergy status to penicillin: Secondary | ICD-10-CM | POA: Insufficient documentation

## 2015-08-16 DIAGNOSIS — IMO0002 Reserved for concepts with insufficient information to code with codable children: Secondary | ICD-10-CM

## 2015-08-16 LAB — URINALYSIS, ROUTINE W REFLEX MICROSCOPIC
Bilirubin Urine: NEGATIVE
GLUCOSE, UA: NEGATIVE mg/dL
HGB URINE DIPSTICK: NEGATIVE
Ketones, ur: NEGATIVE mg/dL
Leukocytes, UA: NEGATIVE
Nitrite: NEGATIVE
Protein, ur: NEGATIVE mg/dL
SPECIFIC GRAVITY, URINE: 1.023 (ref 1.005–1.030)
Urobilinogen, UA: 1 mg/dL (ref 0.0–1.0)
pH: 6 (ref 5.0–8.0)

## 2015-08-16 NOTE — ED Notes (Signed)
Pt with pain in scrotum since yesterday.  States he has a hx of prostate enlargement. Pt states pain does not change with urination but patient is unable to report the last time of urination. Denies hematuria, denies discharge.

## 2015-08-16 NOTE — ED Provider Notes (Signed)
CSN: [id number]     Arrival date & time 08/16/15  1357 History   First MD Initiated Contact with Patient 08/16/15 1456     Chief Complaint  Patient presents with  . Groin Swelling     (Consider location/radiation/quality/duration/timing/severity/associated sxs/prior Treatment) HPI 54 year old male who presents with groin swelling. History of BPH. Yesterday and today, having intermittent dull pain behind the scrotum. States that urination brings it on, but also has pain without urination. Denies urinary retention or incontinence, but feels sensation of not urinating fully. Denies fever or chills or night sweats. Denies abdominal pain, vomiting, diarrhea, flank pain.   Past Medical History  Diagnosis Date  . Hypertension   . Prostate disorder   . Anxiety   . Asperger syndrome   . H/O suicide attempt     overdose "many years ago"  . Restless leg syndrome   . Depression    History reviewed. No pertinent past surgical history. History reviewed. No pertinent family history. Social History  Substance Use Topics  . Smoking status: Current Every Day Smoker -- 2 years    Types: Cigars  . Smokeless tobacco: Never Used  . Alcohol Use: No    Review of Systems 10/14 systems reviewed and are negative other than those stated in the HPI  Allergies  Amoxicillin; Cephalexin; Lemon oil; and Codeine  Home Medications   Prior to Admission medications   Medication Sig Start Date End Date Taking? Authorizing Provider  acetaminophen (TYLENOL) 500 MG tablet Take 1,000 mg by mouth every 6 (six) hours as needed for headache.   Yes Historical Provider, MD  amLODipine (NORVASC) 10 MG tablet Take 1 tablet (10 mg total) by mouth daily before supper. Patient taking differently: Take 10 mg by mouth daily.  07/12/15  Yes Adonis Brook, NP  ARIPiprazole (ABILIFY) 2 MG tablet Take 1 tablet (2 mg total) by mouth daily. 07/12/15  Yes Adonis Brook, NP  clonazePAM (KLONOPIN) 0.5 MG tablet Take 0.75 mg by  mouth at bedtime. Takes 1.5 tablets (0.75 mg) at bedtime.   Yes Historical Provider, MD  FLUoxetine (PROZAC) 20 MG capsule Take 3 capsules (60 mg total) by mouth daily before supper. Patient taking differently: Take 60 mg by mouth daily.  07/12/15  Yes Adonis Brook, NP  gabapentin (NEURONTIN) 400 MG capsule Take 1 capsule (400 mg total) by mouth 2 (two) times daily. Patient taking differently: Take 400-800 mg by mouth 2 (two) times daily. Takes 1 capsule (400 mg) in the morning and Take 2 capsules (800 mg) in the afternoon. 07/12/15  Yes Adonis Brook, NP  hydrOXYzine (ATARAX/VISTARIL) 25 MG tablet Take 1 tablet (25 mg total) by mouth every 6 (six) hours as needed for anxiety (insomnia). 07/12/15  Yes Adonis Brook, NP  lisinopril (PRINIVIL,ZESTRIL) 20 MG tablet Take 1 tablet (20 mg total) by mouth daily. 07/12/15  Yes Adonis Brook, NP  propranolol (INDERAL) 20 MG tablet Take 1 tablet (20 mg total) by mouth daily. 07/12/15  Yes Adonis Brook, NP  QUEtiapine (SEROQUEL) 200 MG tablet Take 200 mg by mouth at bedtime.   Yes Historical Provider, MD  tamsulosin (FLOMAX) 0.4 MG CAPS capsule Take 1 capsule (0.4 mg total) by mouth daily after supper. Patient taking differently: Take 0.4 mg by mouth daily.  07/12/15  Yes Adonis Brook, NP  gabapentin (NEURONTIN) 400 MG capsule Take 2 capsules (800 mg total) by mouth at bedtime. Patient not taking: Reported on 08/16/2015 07/12/15   Adonis Brook, NP   BP 110/71  mmHg  Pulse 66  Temp(Src) 97.9 F (36.6 C) (Oral)  Resp 18  SpO2 99% Physical Exam Physical Exam  Nursing note and vitals reviewed. Constitutional: Well developed, well nourished, non-toxic, and in no acute distress Head: Normocephalic and atraumatic.  Mouth/Throat: Oropharynx is clear and moist.  Neck: Normal range of motion. Neck supple.  Cardiovascular: Normal rate and regular rhythm.   Pulmonary/Chest: Effort normal and breath sounds normal.  Abdominal: Soft. There is no tenderness.  There is no rebound and no guarding.  GU: Normal external genitalia. No scrotal discoloration. No testicular swelling, mass or pain. Tenderness at the base of the left testicle. No significant bogginess or tenderness of the prostate on rectal exam. Musculoskeletal: Normal range of motion.  Neurological: Alert, no facial droop, fluent speech, moves all extremities symmetrically Skin: Skin is warm and dry.  Psychiatric: Cooperative  ED Course  Procedures (including critical care time) Labs Review Labs Reviewed  URINALYSIS, ROUTINE W REFLEX MICROSCOPIC (NOT AT [facility]) - Abnormal; Notable for the following:    APPearance CLOUDY (*)    All other components within normal limits    Imaging Review US Scrotum  08/16/2015   CLINICAL DATA:  Tenderness at base of scrotum.  EXAM: SCROTAL ULTRASOUND  DOPPLER ULTRASOUND OF THE TESTICLES  TECHNIQUE: Complete ultrasound examination of the testicles, epididymis, and other scrotal structures was performed. Color and spectral Doppler ultrasound were also utilized to evaluate blood flow to the testicles.  COMPARISON:  None.  FINDINGS: Right testicle  Measurements: 4.9 x 3.0 x 3.2 cm. No mass or microlithiasis visualized.  Left testicle  Measurements: 4.5 x 2.7 x 3.0 cm. No mass or microlithiasis visualized.  Right epididymis:  Normal in size and appearance.  Left epididymis:  Normal in size and appearance.  Hydrocele:  Small amount of fluid bilaterally, likely physiologic.  Varicocele:  Mild to moderate left varicocele.  Pulsed Doppler interrogation of both testes demonstrates normal low resistance arterial and venous waveforms bilaterally.  IMPRESSION: No testicular abnormality.  Mild-to-moderate left varicocele.   Electronically Signed   By: Charlett Nose M.D.   On: 08/16/2015 16:34   [place] Art/ven Flow Abd Pelv Doppler  08/16/2015   CLINICAL DATA:  Tenderness at base of scrotum.  EXAM: SCROTAL ULTRASOUND  DOPPLER ULTRASOUND OF THE TESTICLES  TECHNIQUE: Complete  ultrasound examination of the testicles, epididymis, and other scrotal structures was performed. Color and spectral Doppler ultrasound were also utilized to evaluate blood flow to the testicles.  COMPARISON:  None.  FINDINGS: Right testicle  Measurements: 4.9 x 3.0 x 3.2 cm. No mass or microlithiasis visualized.  Left testicle  Measurements: 4.5 x 2.7 x 3.0 cm. No mass or microlithiasis visualized.  Right epididymis:  Normal in size and appearance.  Left epididymis:  Normal in size and appearance.  Hydrocele:  Small amount of fluid bilaterally, likely physiologic.  Varicocele:  Mild to moderate left varicocele.  Pulsed Doppler interrogation of both testes demonstrates normal low resistance arterial and venous waveforms bilaterally.  IMPRESSION: No testicular abnormality.  Mild-to-moderate left varicocele.   Electronically Signed   By: Charlett Nose M.D.   On: 08/16/2015 16:34   I have personally reviewed and evaluated these images and lab results as part of my medical decision-making.   MDM   Final diagnoses:  Varicocele  BPH (benign prostatic hyperplasia)    54 year old male who presents with mild pain at the base of his scrotum. Vital signs are unremarkable, and he is well-appearing. He has normal external genitalia  on exam, with normal cremasteric reflexes and no testicular pain. He does have some mild tenderness noted at the base of the scrotum. Rectal exam did not reveal significant prostate tenderness either. UA does not show evidence of infection. Scrotal ultrasound was performed and he has a small left-sided varicocele, but no other GU abnormalities. He'll be given follow-up with urology. Strict return and follow-up instructions reviewed. He expressed understanding of all discharge instructions for comfortable to plan of care.  Lavera Guise, MD 08/16/15 [phone]

## 2015-08-16 NOTE — ED Notes (Signed)
Bed: ZO10 Expected date:  Expected time:  Means of arrival:  Comments: EMS- 54yo M, "prostate problems," Hx of BPH

## 2015-08-16 NOTE — Discharge Instructions (Signed)
Varicocele A varicocele is a swelling of veins in the scrotum (the bag of skin that contains the testicles). It is most common in young men. It occurs most often on the left side. Small or painless varicoceles do not need treatment. Most often, this is not a serious problem, but further tests may be needed to confirm the diagnosis. Surgery may be needed if complications of varicoceles arise. Rarely, varicoceles can reoccur after surgery. CAUSES  The swelling is due to blood backing up in the vein that leads from the testicle back to the body. Blood backs up because the valves inside the vein are not working properly. Veins normally return blood to the heart. Valves in veins are supposed to be one-way valves. They should not allow blood to flow backwards. If the valves do not work well, blood can pool in a vein and make it swell. The same thing happens with varicose veins in the leg. SYMPTOMS  A varicocele most often causes no symptoms. When they occur, symptoms include:   Swelling on one side of the scrotum.  Swelling that is more obvious when standing up.  A lumpy feeling in the scrotum.  Heaviness on one side of the scrotum.  Dull ache in the scrotum, especially after exercise or prolonged standing or sitting.  Slower growth or reduced size of the testicle on the side of the varicocele (in young males).  Problems with fertility can arise if the testicle does not grow normally. DIAGNOSIS  Varicocele is usually diagnosed by a physical exam. Sometimes ultrasonography is done. TREATMENT  Usually, varicoceles need no treatment. They are often routinely monitored on exam by your caregiver to ensure they do not slow the growth of the testicle on that side. Treatment may be needed if:  The varicocele is large.  There is a lot of pain.  The varicocele causes a decrease in the size of the testicle in a growing adolescent.  The other testicle is absent or not normal.  Varicoceles are found on  both sides of the scrotum.  There is pain when exercising.  There are fertility problems. There are two types of treatment:  Surgery. The surgeon ties off the swollen veins. Surgery may be done with an incision in the skin or through a laparoscope. The surgery is usually done in an outpatient setting. Outpatient means there is no overnight stay in a hospital.  Embolization. A small tube is placed in a vein and guided into the swollen veins. X-rays are used to guide the small tube. Tiny metal coils or other blocking items are put through the tube. This blocks swollen veins and the flow of blood. This is usually done in an outpatient setting without the use of general anesthesia. HOME CARE INSTRUCTIONS  To decrease discomfort:  Wear supportive underwear.  Use an athletic supporter for sports.  Only take over-the-counter or prescription medicines for pain or discomfort as directed by your caregiver. SEEK MEDICAL CARE IF:   Pain is increasing.  Swelling does not decrease when lying down.  Testicle is smaller.  The testicle becomes enlarged, swollen, red, or painful. Document Released: 03/13/2001 Document Revised: 02/27/2012 Document Reviewed: 03/17/2010 ExitCare Patient Information 2015 [employer]. This information is not intended to replace advice given to you by your health care provider. Make sure you discuss any questions you have with your health care provider.  

## 2015-08-16 NOTE — ED Notes (Signed)
Pt able to give urine sample, clear yellow urine obtained.

## 2015-08-16 NOTE — ED Notes (Signed)
Pt provided with bus pass, sandwich and apple juice. Patient was alert, oriented, ambulatory, and stable upon discharge. RN went over AVS and patient had no further questions.

## 2015-08-24 ENCOUNTER — Emergency Department (HOSPITAL_COMMUNITY)
Admission: EM | Admit: 2015-08-24 | Discharge: 2015-08-25 | Disposition: A | Discharge status: Home / Self Care | Payer: Medicaid Other | Attending: Emergency Medicine | Admitting: Emergency Medicine

## 2015-08-24 DIAGNOSIS — G2581 Restless legs syndrome: Secondary | ICD-10-CM | POA: Insufficient documentation

## 2015-08-24 DIAGNOSIS — F339 Major depressive disorder, recurrent, unspecified: Secondary | ICD-10-CM | POA: Diagnosis present

## 2015-08-24 DIAGNOSIS — F330 Major depressive disorder, recurrent, mild: Secondary | ICD-10-CM | POA: Insufficient documentation

## 2015-08-24 DIAGNOSIS — N429 Disorder of prostate, unspecified: Secondary | ICD-10-CM | POA: Insufficient documentation

## 2015-08-24 DIAGNOSIS — F419 Anxiety disorder, unspecified: Secondary | ICD-10-CM | POA: Insufficient documentation

## 2015-08-24 DIAGNOSIS — I10 Essential (primary) hypertension: Secondary | ICD-10-CM | POA: Insufficient documentation

## 2015-08-24 DIAGNOSIS — Z880 Allergy status to penicillin: Secondary | ICD-10-CM | POA: Insufficient documentation

## 2015-08-24 DIAGNOSIS — Z915 Personal history of self-harm: Secondary | ICD-10-CM | POA: Insufficient documentation

## 2015-08-24 DIAGNOSIS — F1210 Cannabis abuse, uncomplicated: Secondary | ICD-10-CM | POA: Insufficient documentation

## 2015-08-24 DIAGNOSIS — Z720 Tobacco use: Secondary | ICD-10-CM | POA: Insufficient documentation

## 2015-08-25 ENCOUNTER — Encounter (HOSPITAL_COMMUNITY): Payer: Self-pay | Admitting: *Deleted

## 2015-08-25 DIAGNOSIS — F339 Major depressive disorder, recurrent, unspecified: Secondary | ICD-10-CM | POA: Diagnosis not present

## 2015-08-25 LAB — RAPID URINE DRUG SCREEN, HOSP PERFORMED
Amphetamines: NOT DETECTED
BARBITURATES: NOT DETECTED
Benzodiazepines: NOT DETECTED
Cocaine: NOT DETECTED
Opiates: NOT DETECTED
TETRAHYDROCANNABINOL: POSITIVE — AB

## 2015-08-25 LAB — CBC
HEMATOCRIT: 42.5 % (ref 39.0–52.0)
HEMOGLOBIN: 13.9 g/dL (ref 13.0–17.0)
MCH: 29.4 pg (ref 26.0–34.0)
MCHC: 32.7 g/dL (ref 30.0–36.0)
MCV: 89.9 fL (ref 78.0–100.0)
Platelets: 232 10*3/uL (ref 150–400)
RBC: 4.73 MIL/uL (ref 4.22–5.81)
RDW: 13.7 % (ref 11.5–15.5)
WBC: 7.4 10*3/uL (ref 4.0–10.5)

## 2015-08-25 LAB — COMPREHENSIVE METABOLIC PANEL
ALBUMIN: 4.1 g/dL (ref 3.5–5.0)
ALK PHOS: 79 U/L (ref 38–126)
ALT: 22 U/L (ref 17–63)
AST: 17 U/L (ref 15–41)
Anion gap: 9 (ref 5–15)
BUN: 17 mg/dL (ref 6–20)
CALCIUM: 9.1 mg/dL (ref 8.9–10.3)
CO2: 26 mmol/L (ref 22–32)
CREATININE: 1.03 mg/dL (ref 0.61–1.24)
Chloride: 106 mmol/L (ref 101–111)
GFR calc non Af Amer: >60 mL/min (ref >60)
GLUCOSE: 130 mg/dL — AB (ref 65–99)
Potassium: 4 mmol/L (ref 3.5–5.1)
SODIUM: 141 mmol/L (ref 135–145)
Total Bilirubin: 0.3 mg/dL (ref 0.3–1.2)
Total Protein: 7 g/dL (ref 6.5–8.1)

## 2015-08-25 LAB — ACETAMINOPHEN LEVEL: Acetaminophen (Tylenol), Serum: <10 ug/mL — ABNORMAL LOW (ref 10–30)

## 2015-08-25 LAB — SALICYLATE LEVEL: Salicylate Lvl: <4 mg/dL (ref 2.8–30.0)

## 2015-08-25 LAB — ETHANOL: Alcohol, Ethyl (B): <5 mg/dL (ref <5)

## 2015-08-25 MED ORDER — AMLODIPINE BESYLATE 10 MG PO TABS
10.0000 mg | ORAL_TABLET | Freq: Every day | ORAL | Status: DC
  Administered 2015-08-25: 10 mg via ORAL
  Filled 2015-08-25: qty 1

## 2015-08-25 MED ORDER — LISINOPRIL 20 MG PO TABS
20.0000 mg | ORAL_TABLET | Freq: Every day | ORAL | Status: DC
  Administered 2015-08-25: 20 mg via ORAL
  Filled 2015-08-25: qty 1

## 2015-08-25 MED ORDER — CLONAZEPAM 0.5 MG PO TABS
0.7500 mg | ORAL_TABLET | Freq: Every day | ORAL | Status: DC
  Administered 2015-08-25: 0.75 mg via ORAL
  Filled 2015-08-25: qty 2

## 2015-08-25 MED ORDER — ACETAMINOPHEN 325 MG PO TABS: 650.0000 mg | ORAL_TABLET | ORAL | Status: DC | PRN

## 2015-08-25 MED ORDER — ONDANSETRON HCL 4 MG PO TABS: 4.0000 mg | ORAL_TABLET | Freq: Three times a day (TID) | ORAL | Status: DC | PRN

## 2015-08-25 MED ORDER — HYDROXYZINE HCL 25 MG PO TABS: 25.0000 mg | ORAL_TABLET | Freq: Four times a day (QID) | ORAL | Status: DC | PRN

## 2015-08-25 MED ORDER — QUETIAPINE FUMARATE 100 MG PO TABS
200.0000 mg | ORAL_TABLET | Freq: Every day | ORAL | Status: DC
  Administered 2015-08-25: 200 mg via ORAL
  Filled 2015-08-25: qty 2

## 2015-08-25 MED ORDER — ARIPIPRAZOLE 2 MG PO TABS
2.0000 mg | ORAL_TABLET | Freq: Every day | ORAL | Status: DC
  Administered 2015-08-25: 2 mg via ORAL
  Filled 2015-08-25: qty 1

## 2015-08-25 MED ORDER — PROPRANOLOL HCL 20 MG PO TABS
20.0000 mg | ORAL_TABLET | Freq: Every day | ORAL | Status: DC
  Administered 2015-08-25: 20 mg via ORAL
  Filled 2015-08-25: qty 1

## 2015-08-25 MED ORDER — FLUOXETINE HCL 20 MG PO CAPS
60.0000 mg | ORAL_CAPSULE | Freq: Every day | ORAL | Status: DC
  Administered 2015-08-25: 60 mg via ORAL
  Filled 2015-08-25: qty 3

## 2015-08-25 NOTE — BHH Suicide Risk Assessment (Cosign Needed)
Suicide Risk Assessment  Discharge Assessment   [facility] Discharge Suicide Risk Assessment   Demographic Factors:  Male, Caucasian, Low socioeconomic status and Unemployed  Total Time spent with patient: 20 minutes  Musculoskeletal: Strength & Muscle Tone: within normal limits Gait & Station: normal Patient leans: N/A  Psychiatric Specialty Exam:     Blood pressure 115/64, pulse 86, temperature 98.1 F (36.7 C), temperature source Oral, resp. rate 16, SpO2 99 %.There is no weight on file to calculate BMI.  General Appearance: Casual and Fairly Groomed  Patent attorney::  Good  Speech:  Clear and Coherent and Normal Rate  Volume:  Increased  Mood:  Depressed  Affect:  Congruent and Depressed  Thought Process:  Coherent, Goal Directed and Intact  Orientation:  Full (Time, Place, and Person)  Thought Content:  WDL  Suicidal Thoughts:  No  Homicidal Thoughts:  No  Memory:  Immediate;   Good Recent;   Good Remote;   Good  Judgement:  Good  Insight:  Fair  Psychomotor Activity:  Normal  Concentration:  Good  Recall:  NA  Fund of Knowledge:Fair  Language: Fair  Akathisia:  NA  Handed:  Right  AIMS (if indicated):     Assets:  Desire for Improvement  ADL's:  Intact  Cognition: WNL        Has this patient used any form of tobacco in the last 30 days? (Cigarettes, Smokeless Tobacco, Cigars, and/or Pipes) N/A  Mental Status Per Nursing Assessment::   On Admission:     Current Mental Status by Physician: NA  Loss Factors: NA  Historical Factors: NA  Risk Reduction Factors:   Positive coping skills or problem solving skills  Continued Clinical Symptoms:  Severe Anxiety and/or Agitation Depression:   Insomnia  Cognitive Features That Contribute To Risk:  Polarized thinking    Suicide Risk:  Minimal: No identifiable suicidal ideation.  Patients presenting with no risk factors but with morbid ruminations; may be classified as minimal risk based on the severity of the  depressive symptoms  Principal Problem: Major depressive disorder, recurrent episode Discharge Diagnoses:  Patient Active Problem List   Diagnosis Date Noted  . Major depressive disorder, recurrent episode [F33.9] 07/07/2015    Priority: High  . Anxiety disorder [F41.9] 01/27/2015    Priority: High  . 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 Adjustment disorder with depressed mood [F43.21] 01/18/2015  . Acute encephalopathy [G93.40] 12/18/2013  . Delirium [R41.0] 12/18/2013  . 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 Abnormal brain CT [R93.0] 12/18/2013      Plan Of Care/Follow-up recommendations:  Activity:  as tolerated Diet:  regular  Is patient on multiple antipsychotic therapies at discharge:  No   Has Patient had three or more failed trials of antipsychotic monotherapy by history:  No  Recommended Plan for Multiple Antipsychotic Therapies: NA    Ayiana Winslett C    PMHNP-BC 08/25/2015, 11:22 AM
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
## 2015-08-25 NOTE — ED Notes (Signed)
Pt brought in by [facility], from the homeless shelter.  Reports SI without any plans.  Pt reports heart burn from what he ate tonight.  Pt is calm and cooperative at this time.

## 2015-08-25 NOTE — ED Notes (Signed)
Awake. Verbally responsive. A/O x4. Resp even and unlabored. No audible adventitious breath sounds noted. ABC's intact.  

## 2015-08-25 NOTE — ED Notes (Addendum)
NP and psychiatrist at bedside to speak with pt.

## 2015-08-25 NOTE — ED Provider Notes (Signed)
CSN: [id number]     Arrival date & time 08/24/15  2341 History   First MD Initiated Contact with Patient 08/25/15 0006     Chief Complaint  Patient presents with  . Suicidal     (Consider location/radiation/quality/duration/timing/severity/associated sxs/prior Treatment) HPI Comments: Patient with a history of HTN, Asperger syndrome, previous suicide attempt and depression presents for symptoms of depression with suicidal thoughts. No formed plan. He denies HI, hallucinations. He denies any self harm.  The history is provided by the patient. No language interpreter was used.    Past Medical History  Diagnosis Date  . Hypertension   . Prostate disorder   . Anxiety   . Asperger syndrome   . H/O suicide attempt     overdose "many years ago"  . Restless leg syndrome   . Depression    History reviewed. No pertinent past surgical history. No family history on file. Social History  Substance Use Topics  . Smoking status: Current Every Day Smoker -- 2 years    Types: Cigars  . Smokeless tobacco: Never Used  . Alcohol Use: No    Review of Systems  Constitutional: Negative for fever and chills.  Respiratory: Negative.   Cardiovascular: Negative.   Gastrointestinal: Negative.   Musculoskeletal: Negative.   Skin: Negative.   Neurological: Negative.       Allergies  Amoxicillin; Cephalexin; Lemon oil; and Codeine  Home Medications   Prior to Admission medications   Medication Sig Start Date End Date Taking? Authorizing Provider  acetaminophen (TYLENOL) 500 MG tablet Take 1,000 mg by mouth every 6 (six) hours as needed for headache.   Yes Historical Provider, MD  amLODipine (NORVASC) 10 MG tablet Take 1 tablet (10 mg total) by mouth daily before supper. Patient taking differently: Take 10 mg by mouth daily.  07/12/15  Yes Adonis Brook, NP  ARIPiprazole (ABILIFY) 2 MG tablet Take 1 tablet (2 mg total) by mouth daily. 07/12/15  Yes Adonis Brook, NP  clonazePAM (KLONOPIN)  0.5 MG tablet Take 0.75 mg by mouth at bedtime. Takes 1.5 tablets (0.75 mg) at bedtime.   Yes Historical Provider, MD  FLUoxetine (PROZAC) 20 MG capsule Take 3 capsules (60 mg total) by mouth daily before supper. Patient taking differently: Take 60 mg by mouth daily.  07/12/15  Yes Adonis Brook, NP  gabapentin (NEURONTIN) 400 MG capsule Take 1 capsule (400 mg total) by mouth 2 (two) times daily. Patient taking differently: Take 400-800 mg by mouth 2 (two) times daily. Takes 1 capsule (400 mg) in the morning and Take 2 capsules (800 mg) in the afternoon. 07/12/15  Yes Adonis Brook, NP  hydrOXYzine (ATARAX/VISTARIL) 25 MG tablet Take 1 tablet (25 mg total) by mouth every 6 (six) hours as needed for anxiety (insomnia). 07/12/15  Yes Adonis Brook, NP  lisinopril (PRINIVIL,ZESTRIL) 20 MG tablet Take 1 tablet (20 mg total) by mouth daily. 07/12/15  Yes Adonis Brook, NP  propranolol (INDERAL) 20 MG tablet Take 1 tablet (20 mg total) by mouth daily. 07/12/15  Yes Adonis Brook, NP  QUEtiapine (SEROQUEL) 200 MG tablet Take 200 mg by mouth at bedtime.   Yes Historical Provider, MD  tamsulosin (FLOMAX) 0.4 MG CAPS capsule Take 1 capsule (0.4 mg total) by mouth daily after supper. Patient taking differently: Take 0.4 mg by mouth daily.  07/12/15  Yes Adonis Brook, NP  gabapentin (NEURONTIN) 400 MG capsule Take 2 capsules (800 mg total) by mouth at bedtime. Patient not taking: Reported on 08/16/2015 07/12/15  Adonis Brook, NP   BP 123/85 mmHg  Pulse 83  Temp(Src) 97.8 F (36.6 C) (Oral)  Resp 16  SpO2 98% Physical Exam  Constitutional: He is oriented to person, place, and time. He appears well-developed and well-nourished.  HENT:  Head: Normocephalic.  Neck: Normal range of motion. Neck supple.  Cardiovascular: Normal rate and regular rhythm.   Pulmonary/Chest: Effort normal and breath sounds normal. He has no wheezes. He has no rales.  Abdominal: Soft. Bowel sounds are normal. There is no  tenderness. There is no rebound and no guarding.  Musculoskeletal: Normal range of motion.  Neurological: He is alert and oriented to person, place, and time.  Skin: Skin is warm and dry. No rash noted.  Psychiatric: He has a normal mood and affect.    ED Course  Procedures (including critical care time) Labs Review Labs Reviewed  COMPREHENSIVE METABOLIC PANEL - Abnormal; Notable for the following:    Glucose, Bld 130 (*)    All other components within normal limits  ACETAMINOPHEN LEVEL - Abnormal; Notable for the following:    Acetaminophen (Tylenol), Serum <10 (*)    All other components within normal limits  URINE RAPID DRUG SCREEN, HOSP PERFORMED - Abnormal; Notable for the following:    Tetrahydrocannabinol POSITIVE (*)    All other components within normal limits  ETHANOL  SALICYLATE LEVEL  CBC    Imaging Review No results found. I have personally reviewed and evaluated these images and lab results as part of my medical decision-making.   EKG Interpretation None      MDM   Final diagnoses:  None    1. Depression  2. Suicidal ideation  TTS Consultation to determine disposition.    Elpidio Anis, PA-C 08/25/15 0600  Tomasita Crumble, MD 08/25/15 [phone]

## 2015-08-25 NOTE — ED Notes (Signed)
Resting quietly with eye closed. Easily arousable. Verbally responsive. Resp even and unlabored. ABC's intact. No behavior problems noted. Pt denies audible/visual hallucinations and thoughts of SI. NAD noted. Sitter at bedside.

## 2015-08-25 NOTE — ED Notes (Signed)
Pt eaten 100% of breakfast. Sitter at bedside.
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
## 2015-09-01 ENCOUNTER — Emergency Department (HOSPITAL_COMMUNITY)
Admission: EM | Admit: 2015-09-01 | Discharge: 2015-09-02 | Disposition: A | Discharge status: Home / Self Care | Payer: Medicaid Other | Attending: Emergency Medicine | Admitting: Emergency Medicine

## 2015-09-01 ENCOUNTER — Encounter (HOSPITAL_COMMUNITY): Payer: Self-pay | Admitting: Emergency Medicine

## 2015-09-01 DIAGNOSIS — N429 Disorder of prostate, unspecified: Secondary | ICD-10-CM | POA: Insufficient documentation

## 2015-09-01 DIAGNOSIS — I10 Essential (primary) hypertension: Secondary | ICD-10-CM | POA: Insufficient documentation

## 2015-09-01 DIAGNOSIS — Z720 Tobacco use: Secondary | ICD-10-CM | POA: Insufficient documentation

## 2015-09-01 DIAGNOSIS — G2581 Restless legs syndrome: Secondary | ICD-10-CM | POA: Insufficient documentation

## 2015-09-01 DIAGNOSIS — Z880 Allergy status to penicillin: Secondary | ICD-10-CM | POA: Insufficient documentation

## 2015-09-01 DIAGNOSIS — F419 Anxiety disorder, unspecified: Secondary | ICD-10-CM | POA: Insufficient documentation

## 2015-09-01 DIAGNOSIS — F332 Major depressive disorder, recurrent severe without psychotic features: Secondary | ICD-10-CM | POA: Insufficient documentation

## 2015-09-01 DIAGNOSIS — F1210 Cannabis abuse, uncomplicated: Secondary | ICD-10-CM | POA: Insufficient documentation

## 2015-09-01 LAB — COMPREHENSIVE METABOLIC PANEL
ALK PHOS: 84 U/L (ref 38–126)
ALT: 29 U/L (ref 17–63)
ANION GAP: 7 (ref 5–15)
AST: 24 U/L (ref 15–41)
Albumin: 4.5 g/dL (ref 3.5–5.0)
BILIRUBIN TOTAL: 0.4 mg/dL (ref 0.3–1.2)
BUN: 19 mg/dL (ref 6–20)
CALCIUM: 9.1 mg/dL (ref 8.9–10.3)
CO2: 30 mmol/L (ref 22–32)
Chloride: 103 mmol/L (ref 101–111)
Creatinine, Ser: 1.26 mg/dL — ABNORMAL HIGH (ref 0.61–1.24)
GFR calc Af Amer: >60 mL/min (ref >60)
GLUCOSE: 115 mg/dL — AB (ref 65–99)
POTASSIUM: 4.2 mmol/L (ref 3.5–5.1)
Sodium: 140 mmol/L (ref 135–145)
TOTAL PROTEIN: 7.9 g/dL (ref 6.5–8.1)

## 2015-09-01 LAB — ETHANOL

## 2015-09-01 LAB — CBC
HEMATOCRIT: 45.6 % (ref 39.0–52.0)
Hemoglobin: 15.1 g/dL (ref 13.0–17.0)
MCH: 29.7 pg (ref 26.0–34.0)
MCHC: 33.1 g/dL (ref 30.0–36.0)
MCV: 89.8 fL (ref 78.0–100.0)
PLATELETS: 227 10*3/uL (ref 150–400)
RBC: 5.08 MIL/uL (ref 4.22–5.81)
RDW: 13.6 % (ref 11.5–15.5)
WBC: 7.7 10*3/uL (ref 4.0–10.5)

## 2015-09-01 LAB — SALICYLATE LEVEL: Salicylate Lvl: <4 mg/dL (ref 2.8–30.0)

## 2015-09-01 LAB — ACETAMINOPHEN LEVEL: Acetaminophen (Tylenol), Serum: <10 ug/mL — ABNORMAL LOW (ref 10–30)

## 2015-09-01 MED ORDER — HYDROXYZINE HCL 25 MG PO TABS
25.0000 mg | ORAL_TABLET | Freq: Four times a day (QID) | ORAL | Status: DC | PRN
  Administered 2015-09-01 – 2015-09-02 (×2): 25 mg via ORAL
  Filled 2015-09-01 (×2): qty 1

## 2015-09-01 MED ORDER — QUETIAPINE FUMARATE 100 MG PO TABS
200.0000 mg | ORAL_TABLET | Freq: Every day | ORAL | Status: DC
  Administered 2015-09-01: 200 mg via ORAL
  Filled 2015-09-01: qty 2

## 2015-09-01 MED ORDER — ONDANSETRON HCL 4 MG PO TABS: 4.0000 mg | ORAL_TABLET | Freq: Three times a day (TID) | ORAL | Status: DC | PRN

## 2015-09-01 MED ORDER — PROPRANOLOL HCL 20 MG PO TABS
20.0000 mg | ORAL_TABLET | Freq: Every day | ORAL | Status: DC
  Administered 2015-09-02: 20 mg via ORAL
  Filled 2015-09-01: qty 1

## 2015-09-01 MED ORDER — LISINOPRIL 20 MG PO TABS
20.0000 mg | ORAL_TABLET | Freq: Every day | ORAL | Status: DC
  Administered 2015-09-02: 20 mg via ORAL
  Filled 2015-09-01: qty 1

## 2015-09-01 MED ORDER — ARIPIPRAZOLE 2 MG PO TABS
2.0000 mg | ORAL_TABLET | Freq: Every day | ORAL | Status: DC
  Administered 2015-09-02: 2 mg via ORAL
  Filled 2015-09-01: qty 1

## 2015-09-01 MED ORDER — ALUM & MAG HYDROXIDE-SIMETH 200-200-20 MG/5ML PO SUSP: 30.0000 mL | ORAL | Status: DC | PRN

## 2015-09-01 MED ORDER — NICOTINE 21 MG/24HR TD PT24
21.0000 mg | MEDICATED_PATCH | Freq: Every day | TRANSDERMAL | Status: DC
  Filled 2015-09-01: qty 1

## 2015-09-01 NOTE — [facility] Counselor (Signed)
Disposition: Per Donell Sievert, PA, pt to be re-evaluated by psychiatry in the morning to uphold or rescind IVC.  Counselor shared disposition with pt personally.    Cyndie Mull, [facility] Therapeutic Triage

## 2015-09-01 NOTE — ED Notes (Addendum)
Pt brought in by [employer] for SI. Pt was seen by [facility] today at the homeless shelter that pt is currently residing in. Pt told them that he is Si with a plan of taking enough sleeping pills to make him fall asleep and then before he falls asleep take medications to make his heart stop while he is asleep and he wont know.  Pt has attempted Si about 5 years ago.  Pt upset that he was brought here instead of [place]. Pt states that "i dont like the [place] [employer] quack that works here".
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
## 2015-09-01 NOTE — ED Provider Notes (Signed)
CSN: [id number]     Arrival date & time 09/01/15  1815 History   First MD Initiated Contact with Patient 09/01/15 1854     Chief Complaint  Patient presents with  . Suicidal     (Consider location/radiation/quality/duration/timing/severity/associated sxs/prior Treatment) HPI Comments: Patient here with suicidal ideations with a plan to take enough sleeping pills to make his heart stop. [facility] by [facility] shelter and placed under IVC at that time. Patient does have a history of a prior suicide attempt 5 years ago. Denies any current ingestions at this time. Denies any command hallucinations. Patient is upset and is current living situation which is at least homeless. Patient feels that the county is not doing enough for him.  The history is provided by the patient.    Past Medical History  Diagnosis Date  . Hypertension   . Prostate disorder   . Anxiety   . Asperger syndrome   . H/O suicide attempt     overdose "many years ago"  . Restless leg syndrome   . Depression    History reviewed. No pertinent past surgical history. No family history on file. Social History  Substance Use Topics  . Smoking status: Light Tobacco Smoker -- 2 years    Types: Cigars  . Smokeless tobacco: Never Used  . Alcohol Use: No    Review of Systems  All other systems reviewed and are negative.     Allergies  Amoxicillin; Cephalexin; Lemon oil; and Codeine  Home Medications   Prior to Admission medications   Medication Sig Start Date End Date Taking? Authorizing Provider  acetaminophen (TYLENOL) 500 MG tablet Take 1,000 mg by mouth every 6 (six) hours as needed for headache.    Historical Provider, MD  amLODipine (NORVASC) 10 MG tablet Take 1 tablet (10 mg total) by mouth daily before supper. Patient taking differently: Take 10 mg by mouth daily.  07/12/15   Adonis Brook, NP  ARIPiprazole (ABILIFY) 2 MG tablet Take 1 tablet (2 mg total) by mouth  daily. 07/12/15   Adonis Brook, NP  FLUoxetine (PROZAC) 20 MG capsule Take 3 capsules (60 mg total) by mouth daily before supper. Patient taking differently: Take 60 mg by mouth daily.  07/12/15   Adonis Brook, NP  gabapentin (NEURONTIN) 400 MG capsule Take 1 capsule (400 mg total) by mouth 2 (two) times daily. Patient taking differently: Take 400-800 mg by mouth 2 (two) times daily. Takes 1 capsule (400 mg) in the morning and Take 2 capsules (800 mg) in the afternoon. 07/12/15   Adonis Brook, NP  hydrOXYzine (ATARAX/VISTARIL) 25 MG tablet Take 1 tablet (25 mg total) by mouth every 6 (six) hours as needed for anxiety (insomnia). 07/12/15   Adonis Brook, NP  lisinopril (PRINIVIL,ZESTRIL) 20 MG tablet Take 1 tablet (20 mg total) by mouth daily. 07/12/15   Adonis Brook, NP  propranolol (INDERAL) 20 MG tablet Take 1 tablet (20 mg total) by mouth daily. 07/12/15   Adonis Brook, NP  QUEtiapine (SEROQUEL) 200 MG tablet Take 200 mg by mouth at bedtime.    Historical Provider, MD  tamsulosin (FLOMAX) 0.4 MG CAPS capsule Take 1 capsule (0.4 mg total) by mouth daily after supper. Patient taking differently: Take 0.4 mg by mouth daily.  07/12/15   Adonis Brook, NP   BP 142/92 mmHg  Pulse 82  Temp(Src) 98.1 F (36.7 C) (Oral)  Resp 18  SpO2 98% Physical Exam  Constitutional: He is oriented to person,  place, and time. He appears well-developed and well-nourished.  Non-toxic appearance. No distress.  HENT:  Head: Normocephalic and atraumatic.  Eyes: Conjunctivae, EOM and lids are normal. Pupils are equal, round, and reactive to light.  Neck: Normal range of motion. Neck supple. No tracheal deviation present. No thyroid mass present.  Cardiovascular: Normal rate, regular rhythm and normal heart sounds.  Exam reveals no gallop.   No murmur heard. Pulmonary/Chest: Effort normal and breath sounds normal. No stridor. No respiratory distress. He has no decreased breath sounds. He has no wheezes. He has  no rhonchi. He has no rales.  Abdominal: Soft. Normal appearance and bowel sounds are normal. He exhibits no distension. There is no tenderness. There is no rebound and no CVA tenderness.  Musculoskeletal: Normal range of motion. He exhibits no edema or tenderness.  Neurological: He is alert and oriented to person, place, and time. He has normal strength. No cranial nerve deficit or sensory deficit. GCS eye subscore is 4. GCS verbal subscore is 5. GCS motor subscore is 6.  Skin: Skin is warm and dry. No abrasion and no rash noted.  Psychiatric: His speech is normal. His affect is blunt. He expresses suicidal ideation. He expresses suicidal plans. He expresses no homicidal plans.  Nursing note and vitals reviewed.   ED Course  Procedures (including critical care time) Labs Review Labs Reviewed  CBC  COMPREHENSIVE METABOLIC PANEL  ETHANOL  SALICYLATE LEVEL  ACETAMINOPHEN LEVEL  URINE RAPID DRUG SCREEN, HOSP PERFORMED    Imaging Review No results found. I have personally reviewed and evaluated these images and lab results as part of my medical decision-making.   EKG Interpretation None      MDM   Final diagnoses:  None    Patient to be medically cleared and then seen by psychiatry for placement    Lorre Nick, MD 09/01/15 1926

## 2015-09-01 NOTE — ED Notes (Signed)
Pt presents with complaint of depression and SI, plan to take sleeping pills and heart medication to make his heart stop.  Pt admits to taking an overdose of sleeping pills 5 years ago.  Denies HI or AV hallucinations.  Feeling hopeless.  Pt reports he follows up at [facility] and has been diagnosed with Clinical Depression, Anxiety, Asbergers Syndrome in the past.  Pt also admits to smoking marijuana.  Pt AAO x 3, no distress noted, calm & cooperative, monitoring for safety, Q 15 min checks in effect.

## 2015-09-02 DIAGNOSIS — F332 Major depressive disorder, recurrent severe without psychotic features: Secondary | ICD-10-CM | POA: Diagnosis not present

## 2015-09-02 LAB — RAPID URINE DRUG SCREEN, HOSP PERFORMED
Amphetamines: NOT DETECTED
BARBITURATES: NOT DETECTED
BENZODIAZEPINES: NOT DETECTED
Cocaine: NOT DETECTED
Opiates: NOT DETECTED
Tetrahydrocannabinol: POSITIVE — AB

## 2015-09-02 MED ORDER — QUETIAPINE FUMARATE 200 MG PO TABS: 200.0000 mg | ORAL_TABLET | Freq: Every day | ORAL | Status: DC

## 2015-09-02 MED ORDER — PROPRANOLOL HCL 20 MG PO TABS: 20.0000 mg | ORAL_TABLET | Freq: Every day | ORAL | Status: DC

## 2015-09-02 MED ORDER — LISINOPRIL 20 MG PO TABS: 20.0000 mg | ORAL_TABLET | Freq: Every day | ORAL | Status: DC

## 2015-09-02 MED ORDER — NICOTINE 21 MG/24HR TD PT24: 21.0000 mg | MEDICATED_PATCH | Freq: Every day | TRANSDERMAL | Status: DC

## 2015-09-02 MED ORDER — HYDROXYZINE HCL 25 MG PO TABS: 25.0000 mg | ORAL_TABLET | Freq: Four times a day (QID) | ORAL | Status: DC | PRN

## 2015-09-02 MED ORDER — ARIPIPRAZOLE 2 MG PO TABS: 2.0000 mg | ORAL_TABLET | Freq: Every day | ORAL | Status: DC

## 2015-09-02 MED ORDER — AMLODIPINE BESYLATE 10 MG PO TABS: 10.0000 mg | ORAL_TABLET | Freq: Every day | ORAL | Status: DC

## 2015-09-02 MED ORDER — CLONAZEPAM 0.5 MG PO TABS
0.5000 mg | ORAL_TABLET | Freq: Once | ORAL | Status: AC
  Administered 2015-09-02: 0.5 mg via ORAL
  Filled 2015-09-02: qty 1

## 2015-09-02 MED ORDER — FLUOXETINE HCL 20 MG PO CAPS: 60.0000 mg | ORAL_CAPSULE | Freq: Every day | ORAL | Status: DC

## 2015-09-02 MED ORDER — TAMSULOSIN HCL 0.4 MG PO CAPS: 0.4000 mg | ORAL_CAPSULE | Freq: Every day | ORAL | Status: DC

## 2015-09-02 MED ORDER — GABAPENTIN 400 MG PO CAPS: 400.0000 mg | ORAL_CAPSULE | Freq: Two times a day (BID) | ORAL | Status: DC

## 2015-09-02 NOTE — ED Notes (Signed)
Patient reports trouble getting back to sleep. Inquiring about his home Klonopin order.   Encouragement offered. Given Vistaril.   Q 15 safety checks continue.

## 2015-09-02 NOTE — Progress Notes (Signed)
[facility] meeting recommended [facility]  Entered in [place]
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
## 2015-09-02 NOTE — ED Notes (Signed)
Pt was discharged after receiving AVS, nicotine patch prescriptions, belongings, and bus pass. Pt signed for belongings. Pt appeared in no acute distress and was escorted to lobby by Reggie MHT.

## 2015-09-02 NOTE — Discharge Instructions (Signed)
Follow up with [facility] or [facility].

## 2015-10-01 ENCOUNTER — Emergency Department (HOSPITAL_COMMUNITY)
Admission: EM | Admit: 2015-10-01 | Discharge: 2015-10-01 | Disposition: A | Discharge status: Home / Self Care | Payer: Self-pay | Attending: Emergency Medicine | Admitting: Emergency Medicine

## 2015-10-01 ENCOUNTER — Encounter (HOSPITAL_COMMUNITY): Payer: Self-pay

## 2015-10-01 DIAGNOSIS — R531 Weakness: Secondary | ICD-10-CM | POA: Insufficient documentation

## 2015-10-01 DIAGNOSIS — Z79899 Other long term (current) drug therapy: Secondary | ICD-10-CM | POA: Insufficient documentation

## 2015-10-01 DIAGNOSIS — G2581 Restless legs syndrome: Secondary | ICD-10-CM | POA: Insufficient documentation

## 2015-10-01 DIAGNOSIS — Z9189 Other specified personal risk factors, not elsewhere classified: Secondary | ICD-10-CM | POA: Insufficient documentation

## 2015-10-01 DIAGNOSIS — Z720 Tobacco use: Secondary | ICD-10-CM | POA: Insufficient documentation

## 2015-10-01 DIAGNOSIS — Z880 Allergy status to penicillin: Secondary | ICD-10-CM | POA: Insufficient documentation

## 2015-10-01 DIAGNOSIS — F419 Anxiety disorder, unspecified: Secondary | ICD-10-CM | POA: Insufficient documentation

## 2015-10-01 DIAGNOSIS — F329 Major depressive disorder, single episode, unspecified: Secondary | ICD-10-CM | POA: Insufficient documentation

## 2015-10-01 DIAGNOSIS — I10 Essential (primary) hypertension: Secondary | ICD-10-CM | POA: Insufficient documentation

## 2015-10-01 DIAGNOSIS — Z87438 Personal history of other diseases of male genital organs: Secondary | ICD-10-CM | POA: Insufficient documentation

## 2015-10-01 LAB — CBC
HEMATOCRIT: 40.1 % (ref 39.0–52.0)
Hemoglobin: 13.2 g/dL (ref 13.0–17.0)
MCH: 29 pg (ref 26.0–34.0)
MCHC: 32.9 g/dL (ref 30.0–36.0)
MCV: 88.1 fL (ref 78.0–100.0)
PLATELETS: 195 10*3/uL (ref 150–400)
RBC: 4.55 MIL/uL (ref 4.22–5.81)
RDW: 13 % (ref 11.5–15.5)
WBC: 7.3 10*3/uL (ref 4.0–10.5)

## 2015-10-01 LAB — BASIC METABOLIC PANEL
Anion gap: 7 (ref 5–15)
BUN: 21 mg/dL — AB (ref 6–20)
CHLORIDE: 105 mmol/L (ref 101–111)
CO2: 30 mmol/L (ref 22–32)
CREATININE: 1.3 mg/dL — AB (ref 0.61–1.24)
Calcium: 9.5 mg/dL (ref 8.9–10.3)
GFR calc Af Amer: >60 mL/min (ref >60)
GFR calc non Af Amer: >60 mL/min (ref >60)
GLUCOSE: 92 mg/dL (ref 65–99)
Potassium: 3.8 mmol/L (ref 3.5–5.1)
SODIUM: 142 mmol/L (ref 135–145)

## 2015-10-01 LAB — CBG MONITORING, ED: Glucose-Capillary: 80 mg/dL (ref 65–99)

## 2015-10-01 LAB — URINALYSIS, ROUTINE W REFLEX MICROSCOPIC
BILIRUBIN URINE: NEGATIVE
GLUCOSE, UA: NEGATIVE mg/dL
HGB URINE DIPSTICK: NEGATIVE
KETONES UR: NEGATIVE mg/dL
Leukocytes, UA: NEGATIVE
Nitrite: NEGATIVE
PH: 6 (ref 5.0–8.0)
Protein, ur: NEGATIVE mg/dL
SPECIFIC GRAVITY, URINE: 1.014 (ref 1.005–1.030)
Urobilinogen, UA: 0.2 mg/dL (ref 0.0–1.0)

## 2015-10-01 MED ORDER — SODIUM CHLORIDE 0.9 % IV BOLUS (SEPSIS)
1000.0000 mL | Freq: Once | INTRAVENOUS | Status: AC
Start: 2015-10-01 — End: 2015-10-01
  Administered 2015-10-01: 1000 mL via INTRAVENOUS

## 2015-10-01 NOTE — ED Notes (Signed)
Bus pass given 

## 2015-10-01 NOTE — ED Notes (Signed)
Patient ambulated hallway without any assistance of complaints of SOB or Chest pain or weakness.

## 2015-10-01 NOTE — ED Provider Notes (Signed)
CSN: [id number]     Arrival date & time 10/01/15  5366 History   First MD Initiated Contact with Patient 10/01/15 [phone]     Chief Complaint  Patient presents with  . Weakness     (Consider location/radiation/quality/duration/timing/severity/associated sxs/prior Treatment) HPI Zachary Russo is a 54 y.o. male with a history of Asperger's, depression comes in for evaluation of generalized weakness. Patient reports he was walking back from the library at approximately 9:00 AM this morning. Upon standing, patient felt slightly dizzy for several minutes, this sensation since resolved. Patient denies any associated symptoms. No headache, vision changes, chest pain, shortness of breath, nausea or vomiting, diaphoresis, numbness, leg swelling, cough. He reports he has not been drinking as much water as usual. Nothing makes this problem better or worse. No other aggravating or modifying factors.  Past Medical History  Diagnosis Date  . Hypertension   . Prostate disorder   . Anxiety   . Asperger syndrome   . H/O suicide attempt     overdose "many years ago"  . Restless leg syndrome   . Depression    History reviewed. No pertinent past surgical history. History reviewed. No pertinent family history. Social History  Substance Use Topics  . Smoking status: Light Tobacco Smoker -- 2 years    Types: Cigars  . Smokeless tobacco: Never Used  . Alcohol Use: No    Review of Systems A 10 point review of systems was completed and was negative except for pertinent positives and negatives as mentioned in the history of present illness     Allergies  Amoxicillin; Cephalexin; Lemon oil; and Codeine  Home Medications   Prior to Admission medications   Medication Sig Start Date End Date Taking? Authorizing Provider  amLODipine (NORVASC) 10 MG tablet Take 1 tablet (10 mg total) by mouth daily before supper. 09/02/15   Beau Fanny, FNP  ARIPiprazole (ABILIFY) 2 MG tablet Take 1 tablet (2 mg total) by  mouth daily. 09/02/15   Beau Fanny, FNP  FLUoxetine (PROZAC) 20 MG capsule Take 3 capsules (60 mg total) by mouth daily before supper. 09/02/15   Beau Fanny, FNP  gabapentin (NEURONTIN) 400 MG capsule Take 1 capsule (400 mg total) by mouth 2 (two) times daily. 09/02/15   Beau Fanny, FNP  hydrOXYzine (ATARAX/VISTARIL) 25 MG tablet Take 1 tablet (25 mg total) by mouth every 6 (six) hours as needed for anxiety (insomnia). 09/02/15   Beau Fanny, FNP  lisinopril (PRINIVIL,ZESTRIL) 20 MG tablet Take 1 tablet (20 mg total) by mouth daily. 09/02/15   Beau Fanny, FNP  nicotine (NICODERM CQ - DOSED IN MG/24 HOURS) 21 mg/24hr patch Place 1 patch (21 mg total) onto the skin daily. 09/02/15   Beau Fanny, FNP  propranolol (INDERAL) 20 MG tablet Take 1 tablet (20 mg total) by mouth daily. 09/02/15   Beau Fanny, FNP  tamsulosin (FLOMAX) 0.4 MG CAPS capsule Take 1 capsule (0.4 mg total) by mouth daily after supper. 09/02/15   Everardo All Withrow, FNP   BP 120/75 mmHg  Pulse 69  Temp(Src) 98.1 F (36.7 C) (Oral)  Resp 18  SpO2 93% Physical Exam  Constitutional: He is oriented to person, place, and time. He appears well-developed and well-nourished.  HENT:  Head: Normocephalic and atraumatic.  Dry mucous membranes.  Eyes: Conjunctivae are normal. Pupils are equal, round, and reactive to light. Right eye exhibits no discharge. Left eye exhibits no discharge. No scleral icterus.  Neck:  Neck supple.  Cardiovascular: Normal rate, regular rhythm and normal heart sounds.   Pulmonary/Chest: Effort normal and breath sounds normal. No respiratory distress. He has no wheezes. He has no rales.  Abdominal: Soft. There is no tenderness.  Musculoskeletal: Normal range of motion. He exhibits no edema or tenderness.  Neurological: He is alert and oriented to person, place, and time.  Cranial Nerves II-XII grossly intact. Moves all extremities without ataxia. Motor strength is baseline. Sensation intact to  light touch. Completes fine motor coordination movements without difficulty. Gait is baseline  Skin: Skin is warm and dry. No rash noted.  Psychiatric: He has a normal mood and affect.  Nursing note and vitals reviewed.   ED Course  Procedures (including critical care time) Labs Review Labs Reviewed  BASIC METABOLIC PANEL - Abnormal; Notable for the following:    BUN 21 (*)    Creatinine, Ser 1.30 (*)    All other components within normal limits  URINALYSIS, ROUTINE W REFLEX MICROSCOPIC (NOT [facility]) - Abnormal; Notable for the following:    APPearance CLOUDY (*)    All other components within normal limits  CBC  CBG MONITORING, ED    Imaging Review No results found. I have personally reviewed and evaluated these images and lab results as part of my medical decision-making.   EKG Interpretation   Date/Time:  Thursday October 01 2015 09:55:30 EDT Ventricular Rate:  68 PR Interval:  197 QRS Duration: 93 QT Interval:  396 QTC Calculation: 421 R Axis:   55 Text Interpretation:  Sinus rhythm Baseline wander in lead(s) V1 Confirmed  by COOK  MD, BRIAN ([id number]) on 10/01/2015 11:44:44 AM      Meds given in ED:  Medications  sodium chloride 0.9 % bolus 1,000 mL (0 mLs Intravenous Stopped 10/01/15 1242)    Discharge Medication List as of 10/01/2015  1:14 PM     Filed Vitals:   10/01/15 1226 10/01/15 1230 10/01/15 1320 10/01/15 1322  BP: 112/71 103/67  120/75  Pulse: 63 62 69   Temp:      TempSrc:      Resp: 17 16 18    SpO2: 97% 97% 93%     10:00am--patient seen, workup initiated. Plan is for rehydration and reassess. MDM  Patient presents for evaluation today of fatigue and weakness. On arrival, patient is hemodynamically stable, however slightly hypotensive with pressure in mid 90s systolic. Patient responded well to fluid bolus and blood pressure improved. Upon review of patient's medications, he is taking 3 blood pressure medications. He may benefit from a reduced  dose at this time. Discussed lowering lisinopril from 20 mg to 10 mg, follow-up with PCP for medication reconciliation. Patient agrees with this plan. Physical exam is otherwise unremarkable, remains neurovascularly intact, gait is baseline. Labs are unremarkable. Overall, patient appears well, hemodynamically stable, afebrile and is appropriate for discharge. Prior to patient discharge, I discussed and reviewed this case with Dr. Adriana Simasook, who also saw and evaluated the patient and agrees with above plan. Final diagnoses:  Weakness       Joycie PeekBenjamin Selina Tapper, PA-C 10/03/15 [id number]  Donnetta HutchingBrian Cook, MD 10/05/15 [phone]

## 2015-10-01 NOTE — ED Notes (Signed)
Per EMS, Patient was walking back from the library around 0900 when he started to feel dizzy and weak on his feet. When EMS arrived, Pt wasn't feeling as dizzy, but was feeling weak. Pt is diabetic. CBG 128. Hx of HTN. Vitals per EMS: 102/68- 93/65, 70 HR. Patient alert and Oriented x4. Complains of headache.

## 2015-10-01 NOTE — Discharge Instructions (Signed)
You were evaluated in the emergency department today for your weakness. There does not appear to be an emergent cause for your symptoms at this time. Your symptoms may be due to your blood pressure medicine. Please only take half of your lisinopril-instead of the 20 mg, take only 10 mg. Please follow-up with your PCP tomorrow for reevaluation and medication reconciliation. Return to ED for worsening symptoms.  Weakness Weakness is a lack of strength. It may be felt all over the body (generalized) or in one specific part of the body (focal). Some causes of weakness can be serious. You may need further medical evaluation, especially if you are elderly or you have a history of immunosuppression (such as chemotherapy or HIV), kidney disease, heart disease, or diabetes. CAUSES  Weakness can be caused by many different things, including:  Infection.  Physical exhaustion.  Internal bleeding or other blood loss that results in a lack of red blood cells (anemia).  Dehydration. This cause is more common in elderly people.  Side effects or electrolyte abnormalities from medicines, such as pain medicines or sedatives.  Emotional distress, anxiety, or depression.  Circulation problems, especially severe peripheral arterial disease.  Heart disease, such as rapid atrial fibrillation, bradycardia, or heart failure.  Nervous system disorders, such as Guillain-Barr syndrome, multiple sclerosis, or stroke. DIAGNOSIS  To find the cause of your weakness, your caregiver will take your history and perform a physical exam. Lab tests or X-rays may also be ordered, if needed. TREATMENT  Treatment of weakness depends on the cause of your symptoms and can vary greatly. HOME CARE INSTRUCTIONS   Rest as needed.  Eat a well-balanced diet.  Try to get some exercise every day.  Only take over-the-counter or prescription medicines as directed by your caregiver. SEEK MEDICAL CARE IF:   Your weakness seems to be  getting worse or spreads to other parts of your body.  You develop new aches or pains. SEEK IMMEDIATE MEDICAL CARE IF:   You cannot perform your normal daily activities, such as getting dressed and feeding yourself.  You cannot walk up and down stairs, or you feel exhausted when you do so.  You have shortness of breath or chest pain.  You have difficulty moving parts of your body.  You have weakness in only one area of the body or on only one side of the body.  You have a fever.  You have trouble speaking or swallowing.  You cannot control your bladder or bowel movements.  You have black or bloody vomit or stools. MAKE SURE YOU:  Understand these instructions.  Will watch your condition.  Will get help right away if you are not doing well or get worse.   This information is not intended to replace advice given to you by your health care provider. Make sure you discuss any questions you have with your health care provider.   Document Released: 12/05/2005 Document Revised: 06/05/2012 Document Reviewed: 02/03/2012 Elsevier Interactive Patient Education [employer]2016 [employer]vier Inc.

## 2015-10-04 DIAGNOSIS — F419 Anxiety disorder, unspecified: Secondary | ICD-10-CM | POA: Insufficient documentation

## 2015-10-04 DIAGNOSIS — Z880 Allergy status to penicillin: Secondary | ICD-10-CM | POA: Insufficient documentation

## 2015-10-04 DIAGNOSIS — G2581 Restless legs syndrome: Secondary | ICD-10-CM | POA: Insufficient documentation

## 2015-10-04 DIAGNOSIS — Z87438 Personal history of other diseases of male genital organs: Secondary | ICD-10-CM | POA: Insufficient documentation

## 2015-10-04 DIAGNOSIS — F329 Major depressive disorder, single episode, unspecified: Secondary | ICD-10-CM | POA: Insufficient documentation

## 2015-10-04 DIAGNOSIS — Z760 Encounter for issue of repeat prescription: Secondary | ICD-10-CM | POA: Insufficient documentation

## 2015-10-04 DIAGNOSIS — I10 Essential (primary) hypertension: Secondary | ICD-10-CM | POA: Insufficient documentation

## 2015-10-04 DIAGNOSIS — Z79899 Other long term (current) drug therapy: Secondary | ICD-10-CM | POA: Insufficient documentation

## 2015-10-04 DIAGNOSIS — Z720 Tobacco use: Secondary | ICD-10-CM | POA: Insufficient documentation

## 2015-10-05 ENCOUNTER — Emergency Department (HOSPITAL_COMMUNITY)
Admission: EM | Admit: 2015-10-05 | Discharge: 2015-10-05 | Disposition: A | Discharge status: Home / Self Care | Payer: Self-pay | Attending: Emergency Medicine | Admitting: Emergency Medicine

## 2015-10-05 ENCOUNTER — Encounter (HOSPITAL_COMMUNITY): Payer: Self-pay

## 2015-10-05 DIAGNOSIS — F419 Anxiety disorder, unspecified: Secondary | ICD-10-CM

## 2015-10-05 NOTE — ED Notes (Signed)
Pt arrived via POV for medication evaluation and management

## 2015-10-05 NOTE — ED Notes (Signed)
Pt stable, ambulatory, states understanding of discharge instructions 

## 2015-10-05 NOTE — ED Provider Notes (Signed)
CSN: [id number]   Arrival date & time 10/04/15 2352  History  By signing my name below, I, Bethel Born, attest that this documentation has been prepared under the direction and in the presence of Zadie Rhine, MD. Electronically Signed: Bethel Born, ED Scribe. 10/05/2015. 12:42 AM.  Chief Complaint  Patient presents with  . Medication Management    HPI The history is provided by the patient. No language interpreter was used.   Zachary Russo is a 54 y.o. male with PMHx of HTN, anxiety, Asperger syndrome, and depression who presents to the Emergency Department for medication management. Pt states that he is having problems with his medications and due to a series of  life events is more depressed. Pt is seeking advice on which medications to increase as he is having trouble sleeping. He last slept yesterday. Pt states that he has been using his blood pressure medication to lower his blood pressure in hopes that it will help him sleep.  He is not in possession of his medication as he lives in a shelter that passes medication for him but lists: Prozac 60 mg, quetiapine 200 mg, Gabapentin 400 mg BID and 800 mg before bed, and 1 mg klonopin. Pt denies syncope, dizziness, fever, vomiting, headache, chest pain, abdominal pain, back pain, SI/HI. He is scheduled to see his psychiatrist next week.   Past Medical History  Diagnosis Date  . Hypertension   . Prostate disorder   . Anxiety   . Asperger syndrome   . H/O suicide attempt     overdose "many years ago"  . Restless leg syndrome   . Depression     History reviewed. No pertinent past surgical history.  History reviewed. No pertinent family history.  Social History  Substance Use Topics  . Smoking status: Light Tobacco Smoker -- 2 years    Types: Cigars  . Smokeless tobacco: Never Used  . Alcohol Use: No     Review of Systems  Constitutional: Negative for fever and chills.  Respiratory: Negative for shortness of breath.    Cardiovascular: Negative for chest pain.  Gastrointestinal: Negative for nausea, vomiting and abdominal pain.  Neurological: Negative for dizziness, syncope and weakness.  Psychiatric/Behavioral: Positive for sleep disturbance. Negative for suicidal ideas.  All other systems reviewed and are negative.  Home Medications   Prior to Admission medications   Medication Sig Start Date End Date Taking? Authorizing Provider  amLODipine (NORVASC) 10 MG tablet Take 1 tablet (10 mg total) by mouth daily before supper. 09/02/15   Beau Fanny, FNP  ARIPiprazole (ABILIFY) 2 MG tablet Take 1 tablet (2 mg total) by mouth daily. 09/02/15   Beau Fanny, FNP  FLUoxetine (PROZAC) 20 MG capsule Take 3 capsules (60 mg total) by mouth daily before supper. 09/02/15   Beau Fanny, FNP  gabapentin (NEURONTIN) 400 MG capsule Take 1 capsule (400 mg total) by mouth 2 (two) times daily. 09/02/15   Beau Fanny, FNP  hydrOXYzine (ATARAX/VISTARIL) 25 MG tablet Take 1 tablet (25 mg total) by mouth every 6 (six) hours as needed for anxiety (insomnia). 09/02/15   Beau Fanny, FNP  lisinopril (PRINIVIL,ZESTRIL) 20 MG tablet Take 1 tablet (20 mg total) by mouth daily. 09/02/15   Beau Fanny, FNP  nicotine (NICODERM CQ - DOSED IN MG/24 HOURS) 21 mg/24hr patch Place 1 patch (21 mg total) onto the skin daily. 09/02/15   Beau Fanny, FNP  propranolol (INDERAL) 20 MG tablet Take 1 tablet (20  mg total) by mouth daily. 09/02/15   Beau FannyJohn C Withrow, FNP  tamsulosin (FLOMAX) 0.4 MG CAPS capsule Take 1 capsule (0.4 mg total) by mouth daily after supper. 09/02/15   Beau FannyJohn C Withrow, FNP    Allergies  Amoxicillin; Cephalexin; Lemon oil; and Codeine  Triage Vitals: BP 102/75 mmHg  Pulse 83  Temp(Src) 97.9 F (36.6 C) (Oral)  Resp 16  SpO2 95%  Physical Exam CONSTITUTIONAL: Well developed/well nourished HEAD: Normocephalic/atraumatic EYES: EOMI/PERRL ENMT: Mucous membranes moist NECK: supple no meningeal  signs SPINE/BACK:entire spine nontender CV: S1/S2 noted, no murmurs/rubs/gallops noted LUNGS: Lungs are clear to auscultation bilaterally, no apparent distress ABDOMEN: soft, nontender, no rebound or guarding, bowel sounds noted throughout abdomen NEURO: Pt is awake/alert/appropriate, moves all extremitiesx4.  No facial droop.   EXTREMITIES: pulses normal/equal, full ROM SKIN: warm, color normal PSYCH: Flat affect   ED Course  Procedures   DIAGNOSTIC STUDIES: Oxygen Saturation is 95% on RA, normal by my interpretation.    COORDINATION OF CARE: 12:40 AM Discussed treatment plan  with pt at bedside and pt agreed to plan.   Advised he could increase klonopin to 1.5mg  QHS Pt stable Denies SI He is in no distress Stable for d/c home   MDM   Final diagnoses:  Anxiety     Nursing notes including past medical history and social history reviewed and considered in documentation  I, Joya GaskinsWICKLINE,Aashrith Eves W, personally performed the services described in this documentation. All medical record entries made by the scribe were at my direction and in my presence.  I have reviewed the chart and discharge instructions and agree that the record reflects my personal performance and is accurate and complete. Joya GaskinsWICKLINE,Hilda Rynders W.  10/05/2015. 1:03 AM.       Zadie Rhineonald Janne Faulk, MD 10/05/15 [phone]

## 2015-10-05 NOTE — Discharge Instructions (Signed)
YOU CAN TAKE AN EXTRA 0.5MG  OF KLONOPIN AT BEDTIME (TOTAL KLONOPIN 1.5MG ) ALSO, YOU CAN TAKE A NAP DURING THE DAY IF YOU ARE TIRED  Generalized Anxiety Disorder Generalized anxiety disorder (GAD) is a mental disorder. It interferes with life functions, including relationships, work, and school. GAD is different from normal anxiety, which everyone experiences at some point in their lives in response to specific life events and activities. Normal anxiety actually helps us prepare for and get through these life events and activities. Normal anxiety goes away after the event or activity is over.  GAD causes anxiety that is not necessarily related to specific events or activities. It also causes excess anxiety in proportion to specific events or activities. The anxiety associated with GAD is also difficult to control. GAD can vary from mild to severe. People with severe GAD can have intense waves of anxiety with physical symptoms (panic attacks).  SYMPTOMS The anxiety and worry associated with GAD are difficult to control. This anxiety and worry are related to many life events and activities and also occur more days than not for 6 months or longer. People with GAD also have three or more of the following symptoms (one or more in children):  Restlessness.   Fatigue.  Difficulty concentrating.   Irritability.  Muscle tension.  Difficulty sleeping or unsatisfying sleep. DIAGNOSIS GAD is diagnosed through an assessment by your health care provider. Your health care provider will ask you questions aboutyour mood,physical symptoms, and events in your life. Your health care provider may ask you about your medical history and use of alcohol or drugs, including prescription medicines. Your health care provider may also do a physical exam and blood tests. Certain medical conditions and the use of certain substances can cause symptoms similar to those associated with GAD. Your health care provider may  refer you to a mental health specialist for further evaluation. TREATMENT The following therapies are usually used to treat GAD:   Medication. Antidepressant medication usually is prescribed for long-term daily control. Antianxiety medicines may be added in severe cases, especially when panic attacks occur.   Talk therapy (psychotherapy). Certain types of talk therapy can be helpful in treating GAD by providing support, education, and guidance. A form of talk therapy called cognitive behavioral therapy can teach you healthy ways to think about and react to daily life events and activities.  Stress managementtechniques. These include yoga, meditation, and exercise and can be very helpful when they are practiced regularly. A mental health specialist can help determine which treatment is best for you. Some people see improvement with one therapy. However, other people require a combination of therapies.   This information is not intended to replace advice given to you by your health care provider. Make sure you discuss any questions you have with your health care provider.   Document Released: 04/01/2013 Document Revised: 12/26/2014 Document Reviewed: 04/01/2013 Elsevier Interactive Patient Education [employer]2016 Elsevier Inc.

## 2016-01-16 ENCOUNTER — Emergency Department (HOSPITAL_COMMUNITY)
Admission: EM | Admit: 2016-01-16 | Discharge: 2016-01-16 | Disposition: A | Discharge status: Home / Self Care | Payer: Medicaid Other | Attending: Emergency Medicine | Admitting: Emergency Medicine

## 2016-01-16 ENCOUNTER — Encounter (HOSPITAL_COMMUNITY): Payer: Self-pay | Admitting: *Deleted

## 2016-01-16 ENCOUNTER — Emergency Department (HOSPITAL_COMMUNITY): Payer: Medicaid Other

## 2016-01-16 DIAGNOSIS — Z880 Allergy status to penicillin: Secondary | ICD-10-CM | POA: Diagnosis not present

## 2016-01-16 DIAGNOSIS — G2581 Restless legs syndrome: Secondary | ICD-10-CM | POA: Diagnosis not present

## 2016-01-16 DIAGNOSIS — I10 Essential (primary) hypertension: Secondary | ICD-10-CM | POA: Diagnosis not present

## 2016-01-16 DIAGNOSIS — Z590 Homelessness: Secondary | ICD-10-CM | POA: Diagnosis not present

## 2016-01-16 DIAGNOSIS — F419 Anxiety disorder, unspecified: Secondary | ICD-10-CM | POA: Diagnosis not present

## 2016-01-16 DIAGNOSIS — F329 Major depressive disorder, single episode, unspecified: Secondary | ICD-10-CM | POA: Insufficient documentation

## 2016-01-16 DIAGNOSIS — R1110 Vomiting, unspecified: Secondary | ICD-10-CM | POA: Diagnosis present

## 2016-01-16 DIAGNOSIS — R112 Nausea with vomiting, unspecified: Secondary | ICD-10-CM | POA: Insufficient documentation

## 2016-01-16 DIAGNOSIS — F17210 Nicotine dependence, cigarettes, uncomplicated: Secondary | ICD-10-CM | POA: Diagnosis not present

## 2016-01-16 DIAGNOSIS — Z87438 Personal history of other diseases of male genital organs: Secondary | ICD-10-CM | POA: Insufficient documentation

## 2016-01-16 DIAGNOSIS — J069 Acute upper respiratory infection, unspecified: Secondary | ICD-10-CM | POA: Diagnosis not present

## 2016-01-16 DIAGNOSIS — Z79899 Other long term (current) drug therapy: Secondary | ICD-10-CM | POA: Insufficient documentation

## 2016-01-16 LAB — URINALYSIS, ROUTINE W REFLEX MICROSCOPIC
BILIRUBIN URINE: NEGATIVE
Glucose, UA: NEGATIVE mg/dL
HGB URINE DIPSTICK: NEGATIVE
KETONES UR: 40 mg/dL — AB
NITRITE: NEGATIVE
PROTEIN: NEGATIVE mg/dL
SPECIFIC GRAVITY, URINE: 1.02 (ref 1.005–1.030)
pH: 6.5 (ref 5.0–8.0)

## 2016-01-16 LAB — URINE MICROSCOPIC-ADD ON

## 2016-01-16 LAB — COMPREHENSIVE METABOLIC PANEL
ALBUMIN: 4.4 g/dL (ref 3.5–5.0)
ALK PHOS: 95 U/L (ref 38–126)
ALT: 20 U/L (ref 17–63)
ANION GAP: 14 (ref 5–15)
AST: 25 U/L (ref 15–41)
BILIRUBIN TOTAL: 0.6 mg/dL (ref 0.3–1.2)
BUN: 10 mg/dL (ref 6–20)
CALCIUM: 9.3 mg/dL (ref 8.9–10.3)
CO2: 24 mmol/L (ref 22–32)
Chloride: 104 mmol/L (ref 101–111)
Creatinine, Ser: 1.03 mg/dL (ref 0.61–1.24)
GFR calc non Af Amer: >60 mL/min (ref >60)
GLUCOSE: 104 mg/dL — AB (ref 65–99)
Potassium: 3.6 mmol/L (ref 3.5–5.1)
Sodium: 142 mmol/L (ref 135–145)
TOTAL PROTEIN: 7.5 g/dL (ref 6.5–8.1)

## 2016-01-16 LAB — CBC
HCT: 43.1 % (ref 39.0–52.0)
HEMOGLOBIN: 14.4 g/dL (ref 13.0–17.0)
MCH: 29.8 pg (ref 26.0–34.0)
MCHC: 33.4 g/dL (ref 30.0–36.0)
MCV: 89 fL (ref 78.0–100.0)
PLATELETS: 255 10*3/uL (ref 150–400)
RBC: 4.84 MIL/uL (ref 4.22–5.81)
RDW: 15 % (ref 11.5–15.5)
WBC: 8.5 10*3/uL (ref 4.0–10.5)

## 2016-01-16 LAB — LIPASE, BLOOD: Lipase: 39 U/L (ref 11–51)

## 2016-01-16 MED ORDER — PANTOPRAZOLE SODIUM 40 MG IV SOLR
40.0000 mg | Freq: Once | INTRAVENOUS | Status: AC
  Administered 2016-01-16: 40 mg via INTRAVENOUS
  Filled 2016-01-16: qty 40

## 2016-01-16 MED ORDER — ONDANSETRON HCL 4 MG/2ML IJ SOLN
4.0000 mg | Freq: Once | INTRAMUSCULAR | Status: AC
  Administered 2016-01-16: 4 mg via INTRAVENOUS
  Filled 2016-01-16: qty 2

## 2016-01-16 MED ORDER — ONDANSETRON HCL 4 MG PO TABS: 4.0000 mg | ORAL_TABLET | Freq: Four times a day (QID) | ORAL | Status: DC

## 2016-01-16 MED ORDER — LORATADINE 10 MG PO TABS: 10.0000 mg | ORAL_TABLET | Freq: Every day | ORAL | Status: DC

## 2016-01-16 MED ORDER — SODIUM CHLORIDE 0.9 % IV SOLN
1000.0000 mL | Freq: Once | INTRAVENOUS | Status: AC
  Administered 2016-01-16: 1000 mL via INTRAVENOUS

## 2016-01-16 NOTE — ED Provider Notes (Signed)
CSN: [id number]     Arrival date & time 01/16/16  0915 History   First MD Initiated Contact with Patient 01/16/16 [phone]     Chief Complaint  Patient presents with  . Emesis  . Homeless     (Consider location/radiation/quality/duration/timing/severity/associated sxs/prior Treatment) HPI Patient reports for several weeks she's had sinus congestion and drainage. No fever and association with this. He describes postnasal drip and coughing. No Chest pain or dyspnea. He reports yesterday evening he started vomiting. He reports multiple episodes overnight. He denies diarrhea or abdominal pain. Patient is homeless. He reports a history of depression and Asberger syndrome. Patient has regular marijuana use. No alcohol use. Past Medical History  Diagnosis Date  . Hypertension   . Prostate disorder   . Anxiety   . Asperger syndrome   . H/O suicide attempt     overdose "many years ago"  . Restless leg syndrome   . Depression    History reviewed. No pertinent past surgical history. No family history on file. Social History  Substance Use Topics  . Smoking status: Light Tobacco Smoker -- 2 years    Types: Cigars  . Smokeless tobacco: Never Used  . Alcohol Use: No    Review of Systems  10 Systems reviewed and are negative for acute change except as noted in the HPI.   Allergies  Amoxicillin; Atarax; Cephalexin; Lemon oil; Tape; and Codeine  Home Medications   Prior to Admission medications   Medication Sig Start Date End Date Taking? Authorizing Provider  amLODipine (NORVASC) 10 MG tablet Take 1 tablet (10 mg total) by mouth daily before supper. 09/02/15  Yes Beau Fanny, FNP  clonazePAM (KLONOPIN) 1 MG tablet Take 1 mg by mouth at bedtime.   Yes Historical Provider, MD  FLUoxetine (PROZAC) 20 MG capsule Take 3 capsules (60 mg total) by mouth daily before supper. Patient taking differently: Take 40 mg by mouth daily after breakfast.  09/02/15  Yes Beau Fanny, FNP  gabapentin  (NEURONTIN) 400 MG capsule Take 1 capsule (400 mg total) by mouth 2 (two) times daily. 09/02/15  Yes Beau Fanny, FNP  lisinopril (PRINIVIL,ZESTRIL) 20 MG tablet Take 1 tablet (20 mg total) by mouth daily. 09/02/15  Yes Beau Fanny, FNP  propranolol (INDERAL) 20 MG tablet Take 1 tablet (20 mg total) by mouth daily. 09/02/15  Yes Beau Fanny, FNP  tamsulosin (FLOMAX) 0.4 MG CAPS capsule Take 1 capsule (0.4 mg total) by mouth daily after supper. 09/02/15  Yes Beau Fanny, FNP  ARIPiprazole (ABILIFY) 2 MG tablet Take 1 tablet (2 mg total) by mouth daily. Patient not taking: Reported on 01/16/2016 09/02/15   Beau Fanny, FNP  hydrOXYzine (ATARAX/VISTARIL) 25 MG tablet Take 1 tablet (25 mg total) by mouth every 6 (six) hours as needed for anxiety (insomnia). Patient not taking: Reported on 01/16/2016 09/02/15   Beau Fanny, FNP  loratadine (CLARITIN) 10 MG tablet Take 1 tablet (10 mg total) by mouth daily. One po daily x 5 days 01/16/16   Arby Barrette, MD  nicotine (NICODERM CQ - DOSED IN MG/24 HOURS) 21 mg/24hr patch Place 1 patch (21 mg total) onto the skin daily. Patient not taking: Reported on 01/16/2016 09/02/15   Beau Fanny, FNP  ondansetron (ZOFRAN) 4 MG tablet Take 1 tablet (4 mg total) by mouth every 6 (six) hours. 01/16/16   Arby Barrette, MD   BP 157/101 mmHg  Pulse 88  Temp(Src) 98 F (36.7 C) (  Oral)  Resp 21  Ht  (1.727 m)  SpO2 97% Physical Exam  Constitutional: He is oriented to person, place, and time. He appears well-developed and well-nourished.  HENT:  Head: Normocephalic and atraumatic.  Nose: Nose normal.  Mouth/Throat: Oropharynx is clear and moist.  Eyes: EOM are normal. Pupils are equal, round, and reactive to light.  Neck: Neck supple.  Cardiovascular: Normal rate, regular rhythm, normal heart sounds and intact distal pulses.   Pulmonary/Chest: Effort normal and breath sounds normal.  Cough with deep inspiration.  Abdominal: Soft. Bowel sounds are  normal. He exhibits no distension. There is no tenderness.  Musculoskeletal: Normal range of motion. He exhibits no edema or tenderness.  Neurological: He is alert and oriented to person, place, and time. He has normal strength. Coordination normal. GCS eye subscore is 4. GCS verbal subscore is 5. GCS motor subscore is 6.  Skin: Skin is warm, dry and intact.  Psychiatric: He has a normal mood and affect.    ED Course  Procedures (including critical care time) Labs Review Labs Reviewed  COMPREHENSIVE METABOLIC PANEL - Abnormal; Notable for the following:    Glucose, Bld 104 (*)    All other components within normal limits  URINALYSIS, ROUTINE W REFLEX MICROSCOPIC (NOT AT [facility]) - Abnormal; Notable for the following:    APPearance CLOUDY (*)    Ketones, ur 40 (*)    Leukocytes, UA SMALL (*)    All other components within normal limits  URINE MICROSCOPIC-ADD ON - Abnormal; Notable for the following:    Squamous Epithelial / LPF 0-5 (*)    Bacteria, UA RARE (*)    All other components within normal limits  URINE CULTURE  LIPASE, BLOOD  CBC    Imaging Review Dg Chest 2 View  01/16/2016  CLINICAL DATA:  Emesis and cough. EXAM: CHEST  2 VIEW COMPARISON:  None. FINDINGS: The heart size and mediastinal contours are within normal limits. Both lungs are clear. The visualized skeletal structures are unremarkable. IMPRESSION: No active cardiopulmonary disease. Electronically Signed   By: Kennith Center M.D.   On: 01/16/2016 11:32   I have personally reviewed and evaluated these images and lab results as part of my medical decision-making.   EKG Interpretation None      MDM   Final diagnoses:  URI (upper respiratory infection)  Non-intractable vomiting with nausea, vomiting of unspecified type   Patient is nontoxic and alert. He reports multiple episodes of vomiting overnight. Patient has been hydrated. Examination is soft and nonsurgical in nature. Patient describes upper respiratory  infection type symptoms. Chest x-ray does not show pneumonia, patient does not have leukocytosis or fever. At this time symptoms are most consistent with viral etiology. Patient reports being homeless, case management will be consult it to determine patient's social needs and available resources    Arby Barrette, MD 01/16/16 1205

## 2016-01-16 NOTE — ED Notes (Signed)
Called X-ray and they stated pt next in line.

## 2016-01-16 NOTE — Discharge Instructions (Signed)
Upper Respiratory Infection, Adult Most upper respiratory infections (URIs) are a viral infection of the air passages leading to the lungs. A URI affects the nose, throat, and upper air passages. The most common type of URI is nasopharyngitis and is typically referred to as "the common cold." URIs run their course and usually go away on their own. Most of the time, a URI does not require medical attention, but sometimes a bacterial infection in the upper airways can follow a viral infection. This is called a secondary infection. Sinus and middle ear infections are common types of secondary upper respiratory infections. Bacterial pneumonia can also complicate a URI. A URI can worsen asthma and chronic obstructive pulmonary disease (COPD). Sometimes, these complications can require emergency medical care and may be life threatening.  CAUSES Almost all URIs are caused by viruses. A virus is a type of germ and can spread from one person to another.  RISKS FACTORS You may be at risk for a URI if:   You smoke.   You have chronic heart or lung disease.  You have a weakened defense (immune) system.   You are very young or very old.   You have nasal allergies or asthma.  You work in crowded or poorly ventilated areas.  You work in health care facilities or schools. SIGNS AND SYMPTOMS  Symptoms typically develop 2-3 days after you come in contact with a cold virus. Most viral URIs last 7-10 days. However, viral URIs from the influenza virus (flu virus) can last 14-18 days and are typically more severe. Symptoms may include:   Runny or stuffy (congested) nose.   Sneezing.   Cough.   Sore throat.   Headache.   Fatigue.   Fever.   Loss of appetite.   Pain in your forehead, behind your eyes, and over your cheekbones (sinus pain).  Muscle aches.  DIAGNOSIS  Your health care provider may diagnose a URI by:  Physical exam.  Tests to check that your symptoms are not due to  another condition such as:  Strep throat.  Sinusitis.  Pneumonia.  Asthma. TREATMENT  A URI goes away on its own with time. It cannot be cured with medicines, but medicines may be prescribed or recommended to relieve symptoms. Medicines may help:  Reduce your fever.  Reduce your cough.  Relieve nasal congestion. HOME CARE INSTRUCTIONS   Take medicines only as directed by your health care provider.   Gargle warm saltwater or take cough drops to comfort your throat as directed by your health care provider.  Use a warm mist humidifier or inhale steam from a shower to increase air moisture. This may make it easier to breathe.  Drink enough fluid to keep your urine clear or pale yellow.   Eat soups and other clear broths and maintain good nutrition.   Rest as needed.   Return to work when your temperature has returned to normal or as your health care provider advises. You may need to stay home longer to avoid infecting others. You can also use a face mask and careful hand washing to prevent spread of the virus.  Increase the usage of your inhaler if you have asthma.   Do not use any tobacco products, including cigarettes, chewing tobacco, or electronic cigarettes. If you need help quitting, ask your health care provider. PREVENTION  The best way to protect yourself from getting a cold is to practice good hygiene.   Avoid oral or hand contact with people with cold  symptoms.   Wash your hands often if contact occurs.  There is no clear evidence that vitamin C, vitamin E, echinacea, or exercise reduces the chance of developing a cold. However, it is always recommended to get plenty of rest, exercise, and practice good nutrition.  SEEK MEDICAL CARE IF:   You are getting worse rather than better.   Your symptoms are not controlled by medicine.   You have chills.  You have worsening shortness of breath.  You have brown or red mucus.  You have yellow or brown nasal  discharge.  You have pain in your face, especially when you bend forward.  You have a fever.  You have swollen neck glands.  You have pain while swallowing.  You have white areas in the back of your throat. SEEK IMMEDIATE MEDICAL CARE IF:   You have severe or persistent:  Headache.  Ear pain.  Sinus pain.  Chest pain.  You have chronic lung disease and any of the following:  Wheezing.  Prolonged cough.  Coughing up blood.  A change in your usual mucus.  You have a stiff neck.  You have changes in your:  Vision.  Hearing.  Thinking.  Mood. MAKE SURE YOU:   Understand these instructions.  Will watch your condition.  Will get help right away if you are not doing well or get worse.   This information is not intended to replace advice given to you by your health care provider. Make sure you discuss any questions you have with your health care provider.   Document Released: 05/31/2001 Document Revised: 04/21/2015 Document Reviewed: 03/12/2014 Elsevier Interactive Patient Education 2016 Elsevier Inc. Nausea and Vomiting Nausea is a sick feeling that often comes before throwing up (vomiting). Vomiting is a reflex where stomach contents come out of your mouth. Vomiting can cause severe loss of body fluids (dehydration). Children and elderly adults can become dehydrated quickly, especially if they also have diarrhea. Nausea and vomiting are symptoms of a condition or disease. It is important to find the cause of your symptoms. CAUSES   Direct irritation of the stomach lining. This irritation can result from increased acid production (gastroesophageal reflux disease), infection, food poisoning, taking certain medicines (such as nonsteroidal anti-inflammatory drugs), alcohol use, or tobacco use.  Signals from the brain.These signals could be caused by a headache, heat exposure, an inner ear disturbance, increased pressure in the brain from injury, infection, a  tumor, or a concussion, pain, emotional stimulus, or metabolic problems.  An obstruction in the gastrointestinal tract (bowel obstruction).  Illnesses such as diabetes, hepatitis, gallbladder problems, appendicitis, kidney problems, cancer, sepsis, atypical symptoms of a heart attack, or eating disorders.  Medical treatments such as chemotherapy and radiation.  Receiving medicine that makes you sleep (general anesthetic) during surgery. DIAGNOSIS Your caregiver may ask for tests to be done if the problems do not improve after a few days. Tests may also be done if symptoms are severe or if the reason for the nausea and vomiting is not clear. Tests may include:  Urine tests.  Blood tests.  Stool tests.  Cultures (to look for evidence of infection).  X-rays or other imaging studies. Test results can help your caregiver make decisions about treatment or the need for additional tests. TREATMENT You need to stay well hydrated. Drink frequently but in small amounts.You may wish to drink water, sports drinks, clear broth, or eat frozen ice pops or gelatin dessert to help stay hydrated.When you eat, eating slowly may  help prevent nausea.There are also some antinausea medicines that may help prevent nausea. HOME CARE INSTRUCTIONS   Take all medicine as directed by your caregiver.  If you do not have an appetite, do not force yourself to eat. However, you must continue to drink fluids.  If you have an appetite, eat a normal diet unless your caregiver tells you differently.  Eat a variety of complex carbohydrates (rice, wheat, potatoes, bread), lean meats, yogurt, fruits, and vegetables.  Avoid high-fat foods because they are more difficult to digest.  Drink enough water and fluids to keep your urine clear or pale yellow.  If you are dehydrated, ask your caregiver for specific rehydration instructions. Signs of dehydration may include:  Severe thirst.  Dry lips and  mouth.  Dizziness.  Dark urine.  Decreasing urine frequency and amount.  Confusion.  Rapid breathing or pulse. SEEK IMMEDIATE MEDICAL CARE IF:   You have blood or brown flecks (like coffee grounds) in your vomit.  You have black or bloody stools.  You have a severe headache or stiff neck.  You are confused.  You have severe abdominal pain.  You have chest pain or trouble breathing.  You do not urinate at least once every 8 hours.  You develop cold or clammy skin.  You continue to vomit for longer than 24 to 48 hours.  You have a fever. MAKE SURE YOU:   Understand these instructions.  Will watch your condition.  Will get help right away if you are not doing well or get worse.   This information is not intended to replace advice given to you by your health care provider. Make sure you discuss any questions you have with your health care provider.   Document Released: 12/05/2005 Document Revised: 02/27/2012 Document Reviewed: 05/04/2011 Elsevier Interactive Patient Education 2016 [employer].  Emergency Department Resource Guide 1) Find a Doctor and Pay Out of Pocket Although you won't have to find out who is covered by your insurance plan, it is a good idea to ask around and get recommendations. You will then need to call the office and see if the doctor you have chosen will accept you as a new patient and what types of options they offer for patients who are self-pay. Some doctors offer discounts or will set up payment plans for their patients who do not have insurance, but you will need to ask so you aren't surprised when you get to your appointment.  2) Contact Your Local Health Department Not all health departments have doctors that can see patients for sick visits, but many do, so it is worth a call to see if yours does. If you don't know where your local health department is, you can check in your phone book. The CDC also has a tool to help you locate your  state's health department, and many state websites also have listings of all of their local health departments.  3) Find a Walk-in Clinic If your illness is not likely to be very severe or complicated, you may want to try a walk in clinic. These are popping up all over the country in pharmacies, drugstores, and shopping centers. They're usually staffed by nurse practitioners or physician assistants that have been trained to treat common illnesses and complaints. They're usually fairly quick and inexpensive. However, if you have serious medical issues or chronic medical problems, these are probably not your best option.  No Primary Care Doctor: - Call Health Connect at  [phone] - they  can help you locate a primary care doctor that  accepts your insurance, provides certain services, etc. - Physician Referral Service- [phone]  Chronic Pain Problems: Organization         Address  Phone   Notes  [facility]  [phone] Patients need to be referred by their primary care doctor.   Medication Assistance: Organization         Address  Phone   Notes  [facility] Medication [facility] [address] [place] [place], [place] [address] [phone] --Must be a resident of [facility] -- Must have NO insurance coverage whatsoever (no Medicaid/ Medicare, etc.) -- The pt. MUST have a primary care doctor that directs their care regularly and follows them in the community   [facility]  [phone]   [employer]  [phone]    Agencies that provide inexpensive medical care: Organization         Address  Phone   Notes  [facility]  [phone]   [facility]    [phone]   [facility] [address] [place], [place] [address] [phone]   [facility] [address], [place] [phone]   Planned Parenthood    [phone]   [facility]    [phone]   [facility] and [facility]  [address], [place] Phone:  [phone], Fax:  [phone] Hours of Operation:  9 am - 6 pm, M-F.  Also accepts Medicaid/Medicare and self-pay.  [facility]  [address], [place] Phone: [phone], Fax: [phone]. Hours of Operation:  8:30 am - 5:30 pm, M-F.  Also accepts Medicaid and self-pay.  [facility] [place] [address], [place] Phone: [phone]   [facility] [address] Natasha Bence [place], [place] [phone], Ext. [phone] Mondays & Thursdays: 7-9 AM.  First 15 patients are seen on a first come, first serve basis.    Medicaid-accepting [facility] Providers:  Organization         Address  Phone   Notes  [facility] [address], [place] [phone] Also accepts self-pay patients.  [facility] [address] Laurell Josephs [place], [place]  [phone]   [facility] [address], [place] [phone]   [facility] [address] [place] [phone]   Renaye Rakers [address], [place]   [phone] Only accepts [place] Access [place] patients after they have their name applied to their card.   Self-Pay (no insurance) in [facility]:  Organization         Address  Phone   Notes  Sickle Cell Patients, [facility] [address] [place], [place] [phone]   [facility] [address] [place], [place] [phone]   [facility] [place]  [address], [place] [phone]   [facility]/Dr. Osei-Bonsu  [address], [place] or [address], [place] [phone] Phone number for both [place] and [place] locations is the same.  Urgent Medical and [facility] [address], [place] [phone]   [facility] [address], [place] or [address] [phone]   [facility] [address] [place] [phone], phone; [phone], fax Sees patients 1st and 3rd Saturday of every month.  Must not qualify for public or private insurance (i.e. Medicaid, Medicare, [place] Health Choice, Veterans' Benefits)  Household income should be no more than 200% of the poverty level The clinic cannot treat you if you are pregnant or think you are pregnant  Sexually transmitted diseases are not treated at the clinic.    Dental Care: Organization         Address  Phone  Notes  [facility] Department of [facility] [address] [place], [place] [phone] Accepts children up to age 1 who are enrolled in [place] or [place] Health Choice; pregnant women with a Medicaid card; and children who have applied for Medicaid or [place] Health Choice, but were declined, whose parents can pay a reduced fee at time of service.  [facility] Department of [facility]  [address], [place] [phone] Accepts children up to age 57 who are enrolled in [place] or [place] Health Choice; pregnant women with a Medicaid card; and children who have applied for Medicaid or [place] Health Choice, but were declined, whose parents can pay a reduced fee at time of service.  [facility]  [address] [place], [place] [phone] Patients are seen by appointment only. Walk-ins are not accepted. [facility] will see patients 43 years of age and older. Monday - Tuesday (8am-5pm) Most Wednesdays (8:30-5pm) $30 per visit, cash only  [facility]  [address], [facility] [phone] Patients are seen by appointment only. Walk-ins are not accepted. [facility] will see patients 15 years of age and older. One Wednesday Evening (Monthly: Volunteer  Based).  $30 per visit, cash only  [employer]  [phone] for adults; Children under age 66, call Graduate Pediatric Dentistry at [phone]. Children aged 62-14, please call [phone] to request a pediatric application.  Dental services are provided in all areas of dental care including fillings, crowns and bridges, complete and partial dentures, implants, gum treatment, root canals, and extractions. Preventive care is also provided. Treatment is provided to both adults and children. Patients are selected via a lottery and there is often a waiting list.   [facility] [address] [place]  [phone] [email or web address]   [facility] [address] [place], [place] [phone], Ext. [phone] Second and Fourth Thursday of each month, opens at 6:30 AM; Clinic ends at 9 AM.  Patients are seen on a first-come first-served basis, and a limited number are seen during each clinic.   [facility]  [address] Ether Griffins [place], [place] [phone]   Eligibility Requirements You must have lived in [place], [place], or [place] counties for at least the last three months.   You cannot be eligible for state or federal sponsored [place], including [employer], [place], or [employer].   You generally cannot be eligible for healthcare insurance through your employer.    How to apply: Eligibility screenings are held every Tuesday and Wednesday afternoon from 1:00 pm until 4:00 pm. You do not need an appointment for the interview!  [facility] [address], [place], [place] [phone]   [facility]  [phone]   [facility] Department  [phone]   [facility] Health Department  [phone]    Behavioral Health Resources in the Community: Intensive Outpatient Programs Organization         Address  Phone  Notes  [facility]  Services [address] [place], [place] [phone]   [facility] Outpatient [address], [place], [place] [phone]   [facility] [address] [place], [place]  [phone]   [facility] Mental Health [address]  [place], [place] [phone] or [phone]   Substance Abuse Resources Organization         Address  Phone  Notes  [facility] Drug Services  [phone]   [facility]  [phone]   [facility]  [phone]   Floydene Flock  [phone]   Residential & Outpatient Substance Abuse Program  [phone]   Psychological Services Organization         Address  Phone  Notes  [facility]  [phone]   [facility] Services  [phone]   [facility] Mental Health [address] [place] [phone] or [phone]    Mobile Crisis Teams Organization         Address  Phone  Notes  [facility], Mobile Crisis Care Unit  [phone]   [facility]  [address] [place], [place] [phone]   Doristine Locks [address] [place] [phone]    Self-Help/Support Groups Organization         Address  Phone             Notes  [facility] [place] - variety of support groups  336- I7437963 Call for more information  Narcotics Anonymous (NA), [facility] [address], [employer] [place]  2 meetings at this location   Statistician         Address  Phone  Notes  [facility] [address],    [place]  [phone]   [facility]  [address] [place] [address], [place], [place] [phone]   [facility] [address] [place], [place] [phone] Admissions: 8am-3pm M-F  [facility] [address]    [place], [place] [phone]   [facility] [address] [place], [place], [place] [phone]    [facility] [address]  [place], [place] [phone]   [facility] - Intensive Outpatient [address], [place], [place] [phone]   [facility] ([facility]iction [facility] [address] [place]  [place], [place] [phone] or [phone]   [facility] ([facility]) [address] [place], [place] [phone] Accepts Medicaid  [facility] [address]  [place] [phone] Substance Abuse/Addiction Treatment   [facility] Organization         Address  Phone  Notes  [facility]  [phone]   Angie Fava, PhD [address] Ervin Knack [place], [place]   [phone] or [phone]   [facility] Behavioral   [address] [place], [place] [phone]   [facility] [address], [place], [place] [phone] Insurance/Medicaid/sponsorship through [employer] and Families [address]                                    [place],  [place] [phone] Therapy/tele-psych/case  [facility] [address]   [place], [place] [phone]    Dr. Lolly Mustache  [phone]   [facility] [place]  [employer] [facility] Dept. 1) [address], [place] 2) [address], [place] 3)  [address], [place] [phone]  [phone]   [facility] Child Abuse Hotline (302) 235-0319 or [phone] (After Hours)

## 2016-01-16 NOTE — Care Management Note (Signed)
Case Management Note  Patient Details  Name: Zachary Russo MRN: [id number] Date of Birth: 1961-08-05  Subjective/Objective:    CM consulted to assist pt with medications.                 Action/Plan: pt had left the waiting room when CM arrived to present GoodRx coupons.    Expected Discharge Date:                  Expected Discharge Plan:     In-House Referral:     Discharge planning Services  CM Consult  Post Acute Care Choice:    Choice offered to:     DME Arranged:    DME Agency:     HH Arranged:    HH Agency:     Status of Service:  Completed, signed off  Medicare Important Message Given:    Date Medicare IM Given:    Medicare IM give by:    Date Additional Medicare IM Given:    Additional Medicare Important Message give by:     If discussed at Long Length of Stay Meetings, dates discussed:    Additional Comments:  Yvone Neu, RN 01/16/2016, 2:10 PM

## 2016-01-16 NOTE — ED Notes (Addendum)
Pt feels he's had a sinus infection for several weeks with a decreasing appetite.  States vomited numerous times last night.  Also has not taken meds for several days b/c he's not had a place to stay and it's difficult keeping up with being homeless (recently ran out of his days to stay at [employer]).  Denies any pain.  Pt states he has been smoking pot more than normal recently.

## 2016-01-18 ENCOUNTER — Encounter (HOSPITAL_COMMUNITY): Payer: Self-pay | Admitting: Emergency Medicine

## 2016-01-18 ENCOUNTER — Emergency Department (HOSPITAL_COMMUNITY)
Admission: EM | Admit: 2016-01-18 | Discharge: 2016-01-19 | Disposition: A | Discharge status: Home / Self Care | Payer: Medicaid Other | Attending: Emergency Medicine | Admitting: Emergency Medicine

## 2016-01-18 DIAGNOSIS — F17200 Nicotine dependence, unspecified, uncomplicated: Secondary | ICD-10-CM | POA: Insufficient documentation

## 2016-01-18 DIAGNOSIS — F329 Major depressive disorder, single episode, unspecified: Secondary | ICD-10-CM | POA: Insufficient documentation

## 2016-01-18 DIAGNOSIS — Z79899 Other long term (current) drug therapy: Secondary | ICD-10-CM | POA: Diagnosis not present

## 2016-01-18 DIAGNOSIS — F419 Anxiety disorder, unspecified: Secondary | ICD-10-CM | POA: Insufficient documentation

## 2016-01-18 DIAGNOSIS — Z880 Allergy status to penicillin: Secondary | ICD-10-CM | POA: Insufficient documentation

## 2016-01-18 DIAGNOSIS — R110 Nausea: Secondary | ICD-10-CM | POA: Diagnosis not present

## 2016-01-18 DIAGNOSIS — R0981 Nasal congestion: Secondary | ICD-10-CM | POA: Diagnosis present

## 2016-01-18 DIAGNOSIS — Z590 Homelessness: Secondary | ICD-10-CM | POA: Diagnosis not present

## 2016-01-18 DIAGNOSIS — I10 Essential (primary) hypertension: Secondary | ICD-10-CM | POA: Diagnosis not present

## 2016-01-18 DIAGNOSIS — J029 Acute pharyngitis, unspecified: Secondary | ICD-10-CM | POA: Insufficient documentation

## 2016-01-18 DIAGNOSIS — E860 Dehydration: Secondary | ICD-10-CM | POA: Insufficient documentation

## 2016-01-18 DIAGNOSIS — K029 Dental caries, unspecified: Secondary | ICD-10-CM | POA: Insufficient documentation

## 2016-01-18 DIAGNOSIS — J0100 Acute maxillary sinusitis, unspecified: Secondary | ICD-10-CM | POA: Diagnosis not present

## 2016-01-18 HISTORY — DX: Homelessness: Z59.0

## 2016-01-18 HISTORY — DX: Homelessness unspecified: Z59.00

## 2016-01-18 LAB — COMPREHENSIVE METABOLIC PANEL
ALBUMIN: 4.4 g/dL (ref 3.5–5.0)
ALK PHOS: 92 U/L (ref 38–126)
ALT: 24 U/L (ref 17–63)
AST: 27 U/L (ref 15–41)
Anion gap: 13 (ref 5–15)
BILIRUBIN TOTAL: 0.9 mg/dL (ref 0.3–1.2)
BUN: 20 mg/dL (ref 6–20)
CALCIUM: 9.6 mg/dL (ref 8.9–10.3)
CO2: 24 mmol/L (ref 22–32)
CREATININE: 1.27 mg/dL — AB (ref 0.61–1.24)
Chloride: 106 mmol/L (ref 101–111)
GFR calc Af Amer: >60 mL/min (ref >60)
GLUCOSE: 109 mg/dL — AB (ref 65–99)
Potassium: 4 mmol/L (ref 3.5–5.1)
Sodium: 143 mmol/L (ref 135–145)
TOTAL PROTEIN: 7.3 g/dL (ref 6.5–8.1)

## 2016-01-18 LAB — URINE CULTURE: Culture: 20000

## 2016-01-18 LAB — URINALYSIS, ROUTINE W REFLEX MICROSCOPIC
Glucose, UA: NEGATIVE mg/dL
Hgb urine dipstick: NEGATIVE
KETONES UR: 40 mg/dL — AB
Leukocytes, UA: NEGATIVE
NITRITE: NEGATIVE
PH: 6 (ref 5.0–8.0)
PROTEIN: NEGATIVE mg/dL
Specific Gravity, Urine: 1.027 (ref 1.005–1.030)

## 2016-01-18 LAB — CBC
HCT: 44.2 % (ref 39.0–52.0)
Hemoglobin: 14.9 g/dL (ref 13.0–17.0)
MCH: 30.1 pg (ref 26.0–34.0)
MCHC: 33.7 g/dL (ref 30.0–36.0)
MCV: 89.3 fL (ref 78.0–100.0)
PLATELETS: 242 10*3/uL (ref 150–400)
RBC: 4.95 MIL/uL (ref 4.22–5.81)
RDW: 14.9 % (ref 11.5–15.5)
WBC: 10.3 10*3/uL (ref 4.0–10.5)

## 2016-01-18 LAB — LIPASE, BLOOD: Lipase: 49 U/L (ref 11–51)

## 2016-01-18 MED ORDER — SODIUM CHLORIDE 0.9 % IV BOLUS (SEPSIS)
1000.0000 mL | Freq: Once | INTRAVENOUS | Status: AC
  Administered 2016-01-18: 1000 mL via INTRAVENOUS

## 2016-01-18 MED ORDER — PROMETHAZINE HCL 25 MG PO TABS
25.0000 mg | ORAL_TABLET | Freq: Once | ORAL | Status: AC
  Administered 2016-01-18: 25 mg via ORAL
  Filled 2016-01-18: qty 1

## 2016-01-18 NOTE — ED Notes (Signed)
RN attempted x 2 IV start; 2nd RN to start IV

## 2016-01-18 NOTE — ED Notes (Signed)
Pt. reports nasal congestion , sinus pressure with runny nose and nausea onset this week , denies emesis or diarrhea . No fever or chills.

## 2016-01-18 NOTE — ED Notes (Signed)
PA at bedside.

## 2016-01-18 NOTE — ED Provider Notes (Signed)
CSN: [id number]     Arrival date & time 01/18/16  2047 History   First MD Initiated Contact with Patient 01/18/16 2159     Chief Complaint  Patient presents with  . Nasal Congestion  . Nausea     (Consider location/radiation/quality/duration/timing/severity/associated sxs/prior Treatment) Patient is a 55 y.o. male presenting with cough. The history is provided by the patient. No language interpreter was used.  Cough Cough characteristics:  Non-productive Severity:  Moderate Onset quality:  Gradual Timing:  Constant Progression:  Worsening Chronicity:  New Smoker: no   Context: upper respiratory infection   Context: not exposure to allergens   Relieved by:  Nothing Worsened by:  Nothing tried Associated symptoms: headaches, sinus congestion and sore throat   Associated symptoms: no chest pain    Pt complains of sinus drainage and nausea.   Pt reports he feels like drainage is making him sick.   Past Medical History  Diagnosis Date  . Hypertension   . Prostate disorder   . Anxiety   . Asperger syndrome   . H/O suicide attempt     overdose "many years ago"  . Restless leg syndrome   . Depression   . Homelessness    History reviewed. No pertinent past surgical history. No family history on file. Social History  Substance Use Topics  . Smoking status: Light Tobacco Smoker -- 2 years    Types: Cigars  . Smokeless tobacco: Never Used  . Alcohol Use: No    Review of Systems  HENT: Positive for sore throat.   Respiratory: Positive for cough.   Cardiovascular: Negative for chest pain.  Neurological: Positive for headaches.  All other systems reviewed and are negative.     Allergies  Amoxicillin; Atarax; Cephalexin; Lemon oil; Tape; and Codeine  Home Medications   Prior to Admission medications   Medication Sig Start Date End Date Taking? Authorizing Provider  amLODipine (NORVASC) 10 MG tablet Take 1 tablet (10 mg total) by mouth daily before supper. 09/02/15   Yes Beau Fanny, FNP  clonazePAM (KLONOPIN) 1 MG tablet Take 1 mg by mouth at bedtime.   Yes Historical Provider, MD  FLUoxetine (PROZAC) 20 MG capsule Take 3 capsules (60 mg total) by mouth daily before supper. Patient taking differently: Take 40 mg by mouth daily after breakfast.  09/02/15  Yes Beau Fanny, FNP  gabapentin (NEURONTIN) 400 MG capsule Take 1 capsule (400 mg total) by mouth 2 (two) times daily. 09/02/15  Yes Beau Fanny, FNP  lisinopril (PRINIVIL,ZESTRIL) 20 MG tablet Take 1 tablet (20 mg total) by mouth daily. 09/02/15  Yes Beau Fanny, FNP  loratadine (CLARITIN) 10 MG tablet Take 1 tablet (10 mg total) by mouth daily. One po daily x 5 days 01/16/16  Yes Arby Barrette, MD  propranolol (INDERAL) 20 MG tablet Take 1 tablet (20 mg total) by mouth daily. 09/02/15  Yes Beau Fanny, FNP  tamsulosin (FLOMAX) 0.4 MG CAPS capsule Take 1 capsule (0.4 mg total) by mouth daily after supper. 09/02/15  Yes Beau Fanny, FNP  ARIPiprazole (ABILIFY) 2 MG tablet Take 1 tablet (2 mg total) by mouth daily. Patient not taking: Reported on 01/16/2016 09/02/15   Beau Fanny, FNP  hydrOXYzine (ATARAX/VISTARIL) 25 MG tablet Take 1 tablet (25 mg total) by mouth every 6 (six) hours as needed for anxiety (insomnia). Patient not taking: Reported on 01/16/2016 09/02/15   Beau Fanny, FNP  nicotine (NICODERM CQ - DOSED IN MG/24 HOURS)  21 mg/24hr patch Place 1 patch (21 mg total) onto the skin daily. Patient not taking: Reported on 01/16/2016 09/02/15   Beau Fanny, FNP  ondansetron (ZOFRAN) 4 MG tablet Take 1 tablet (4 mg total) by mouth every 6 (six) hours. Patient not taking: Reported on 01/18/2016 01/16/16   Arby Barrette, MD   BP 135/90 mmHg  Pulse 80  Temp(Src) 98.3 F (36.8 C) (Oral)  Resp 18  Ht  (1.727 m)  Wt 80.287 kg  BMI 26.92 kg/m2  SpO2 97% Physical Exam  Constitutional: He is oriented to person, place, and time. He appears well-developed and well-nourished.  HENT:   Head: Normocephalic.  Right Ear: External ear normal.  Left Ear: External ear normal.  Mouth/Throat: Oropharynx is clear and moist.  Tender maxillary sinuses,   Multiple decayed black teeth  Eyes: EOM are normal. Pupils are equal, round, and reactive to light.  Neck: Normal range of motion.  Cardiovascular: Normal rate.   Pulmonary/Chest: Effort normal.  Abdominal: He exhibits no distension.  Musculoskeletal: Normal range of motion.  Neurological: He is alert and oriented to person, place, and time.  Psychiatric: He has a normal mood and affect.  Nursing note and vitals reviewed.   ED Course  Procedures (including critical care time) Labs Review Labs Reviewed  COMPREHENSIVE METABOLIC PANEL - Abnormal; Notable for the following:    Glucose, Bld 109 (*)    Creatinine, Ser 1.27 (*)    All other components within normal limits  URINALYSIS, ROUTINE W REFLEX MICROSCOPIC (NOT AT [facility]) - Abnormal; Notable for the following:    Color, Urine AMBER (*)    APPearance CLOUDY (*)    Bilirubin Urine SMALL (*)    Ketones, ur 40 (*)    All other components within normal limits  LIPASE, BLOOD  CBC    Imaging Review No results found. I have personally reviewed and evaluated these images and lab results as part of my medical decision-making.   EKG Interpretation None      MDM  Pt given Iv fluids x 1 liter,  Pt has greater than 40 ketones in his urine and a slight increase in creatinine which I suspect is due to dehydration from nausea Phenergan for nausea/   I suspect pt has sinus infection.  I will treat with bactrim.  Pt given rx for bactrim and phenergan   Final diagnoses:  Acute maxillary sinusitis, recurrence not specified  Nausea  Dehydration        Elson Areas, PA-C 01/19/16 0010  Rolland Porter, MD 01/23/16 [phone]

## 2016-01-19 ENCOUNTER — Telehealth (HOSPITAL_BASED_OUTPATIENT_CLINIC_OR_DEPARTMENT_OTHER): Payer: Self-pay | Admitting: Emergency Medicine

## 2016-01-19 MED ORDER — PROMETHAZINE HCL 25 MG PO TABS: 25.0000 mg | ORAL_TABLET | Freq: Four times a day (QID) | ORAL | Status: DC | PRN

## 2016-01-19 MED ORDER — SULFAMETHOXAZOLE-TRIMETHOPRIM 800-160 MG PO TABS: 1.0000 | ORAL_TABLET | Freq: Two times a day (BID) | ORAL | Status: AC

## 2016-01-19 NOTE — Progress Notes (Signed)
ED Antimicrobial Stewardship Positive Culture Follow Up   Zachary Russo is an 55 y.o. male who presented to [facility] on 01/18/2016 with a chief complaint of  Chief Complaint  Patient presents with  . Nasal Congestion  . Nausea    Recent Results (from the past 720 hour(s))  Urine culture     Status: None   Collection Time: 01/16/16 10:30 AM  Result Value Ref Range Status   Specimen Description URINE, RANDOM  Final   Special Requests NONE  Final   Culture 20,000 COLONIES/mL ENTEROCOCCUS SPECIES  Final   Report Status 01/18/2016 FINAL  Final   Organism ID, Bacteria ENTEROCOCCUS SPECIES  Final      Susceptibility   Enterococcus species - MIC*    AMPICILLIN <=2 SENSITIVE Sensitive     LEVOFLOXACIN 0.5 SENSITIVE Sensitive     NITROFURANTOIN <=16 SENSITIVE Sensitive     VANCOMYCIN 1 SENSITIVE Sensitive     * 20,000 COLONIES/mL ENTEROCOCCUS SPECIES   A: 20,000 CFU enterococcus and no signs or symptoms of UTI--no treatment needed at this time for UTI.  ED Provider: Melburn Hake, PA-C  Gus Height, PharmD Candidate

## 2016-01-19 NOTE — Telephone Encounter (Signed)
Post ED Visit - Positive Culture Follow-up  Culture report reviewed by antimicrobial stewardship pharmacist:   Enzo Bi, Pharm.D.  Celedonio Miyamoto, Pharm.D., BCPS  Garvin Fila, Pharm.D.  Georgina Pillion, Pharm.D., BCPS  [place], [address].D., BCPS, AAHIVP  Estella Husk, Pharm.D., BCPS, AAHIVP  Tennis Must, Pharm.D.  Sherle Poe, [place].D.  Positive urine culture Enterococcus Treated with none, asymptomatic, organism sensitive to the same and no further patient follow-up is required at this time.  Berle Mull 01/19/2016, 9:29 AM

## 2016-01-19 NOTE — Discharge Instructions (Signed)

## 2016-02-03 ENCOUNTER — Encounter (HOSPITAL_COMMUNITY): Payer: Self-pay | Admitting: Emergency Medicine

## 2016-02-03 ENCOUNTER — Emergency Department (HOSPITAL_COMMUNITY)
Admission: EM | Admit: 2016-02-03 | Discharge: 2016-02-03 | Disposition: A | Discharge status: Home / Self Care | Payer: Medicaid Other | Attending: Emergency Medicine | Admitting: Emergency Medicine

## 2016-02-03 ENCOUNTER — Emergency Department (HOSPITAL_COMMUNITY): Payer: Medicaid Other

## 2016-02-03 DIAGNOSIS — F419 Anxiety disorder, unspecified: Secondary | ICD-10-CM | POA: Insufficient documentation

## 2016-02-03 DIAGNOSIS — Z590 Homelessness: Secondary | ICD-10-CM | POA: Diagnosis not present

## 2016-02-03 DIAGNOSIS — F329 Major depressive disorder, single episode, unspecified: Secondary | ICD-10-CM | POA: Diagnosis not present

## 2016-02-03 DIAGNOSIS — Z880 Allergy status to penicillin: Secondary | ICD-10-CM | POA: Diagnosis not present

## 2016-02-03 DIAGNOSIS — Z79899 Other long term (current) drug therapy: Secondary | ICD-10-CM | POA: Diagnosis not present

## 2016-02-03 DIAGNOSIS — Z87438 Personal history of other diseases of male genital organs: Secondary | ICD-10-CM | POA: Diagnosis not present

## 2016-02-03 DIAGNOSIS — I10 Essential (primary) hypertension: Secondary | ICD-10-CM | POA: Diagnosis not present

## 2016-02-03 DIAGNOSIS — J069 Acute upper respiratory infection, unspecified: Secondary | ICD-10-CM | POA: Insufficient documentation

## 2016-02-03 DIAGNOSIS — Z915 Personal history of self-harm: Secondary | ICD-10-CM | POA: Insufficient documentation

## 2016-02-03 DIAGNOSIS — R079 Chest pain, unspecified: Secondary | ICD-10-CM | POA: Diagnosis present

## 2016-02-03 DIAGNOSIS — F17210 Nicotine dependence, cigarettes, uncomplicated: Secondary | ICD-10-CM | POA: Insufficient documentation

## 2016-02-03 DIAGNOSIS — G2581 Restless legs syndrome: Secondary | ICD-10-CM | POA: Diagnosis not present

## 2016-02-03 LAB — CBC
HEMATOCRIT: 42.1 % (ref 39.0–52.0)
HEMOGLOBIN: 13.4 g/dL (ref 13.0–17.0)
MCH: 28.9 pg (ref 26.0–34.0)
MCHC: 31.8 g/dL (ref 30.0–36.0)
MCV: 90.9 fL (ref 78.0–100.0)
Platelets: 197 10*3/uL (ref 150–400)
RBC: 4.63 MIL/uL (ref 4.22–5.81)
RDW: 14.7 % (ref 11.5–15.5)
WBC: 6.2 10*3/uL (ref 4.0–10.5)

## 2016-02-03 LAB — BASIC METABOLIC PANEL
ANION GAP: 8 (ref 5–15)
BUN: 17 mg/dL (ref 6–20)
CHLORIDE: 106 mmol/L (ref 101–111)
CO2: 26 mmol/L (ref 22–32)
Calcium: 9.2 mg/dL (ref 8.9–10.3)
Creatinine, Ser: 1.07 mg/dL (ref 0.61–1.24)
GFR calc non Af Amer: >60 mL/min (ref >60)
Glucose, Bld: 104 mg/dL — ABNORMAL HIGH (ref 65–99)
Potassium: 3.9 mmol/L (ref 3.5–5.1)
Sodium: 140 mmol/L (ref 135–145)

## 2016-02-03 LAB — I-STAT TROPONIN, ED: Troponin i, poc: 0 ng/mL (ref 0.00–0.08)

## 2016-02-03 MED ORDER — AZITHROMYCIN 250 MG PO TABS: ORAL_TABLET | ORAL | Status: DC

## 2016-02-03 MED ORDER — PROMETHAZINE HCL 25 MG PO TABS: 25.0000 mg | ORAL_TABLET | Freq: Four times a day (QID) | ORAL | Status: DC | PRN

## 2016-02-03 NOTE — ED Notes (Signed)
Pt has sinus infection; finished bactrim on Saturday for it. States it did get better; lives in shelter and has not been able to rest.

## 2016-02-03 NOTE — ED Provider Notes (Signed)
CSN: [id number]     Arrival date & time 02/03/16  0906 History   First MD Initiated Contact with Patient 02/03/16 [phone]     Chief Complaint  Patient presents with  . Chest Pain     (Consider location/radiation/quality/duration/timing/severity/associated sxs/prior Treatment) HPI Comments: Patient is a 55 year old male with history of depression, anxiety, and homelessness. He presents for evaluation of a persistent URI for the past 3 months. He reports nasal discharge, chest congestion, and cough. He has been on Bactrim with some relief, however his symptoms worsened yesterday evening. He denies any chest pain, but does report he feels more short of breath with ambulation.  Patient is a 55 y.o. male presenting with URI. The history is provided by the patient.  URI Presenting symptoms: congestion, cough and fatigue   Presenting symptoms: no fever   Severity:  Moderate Onset quality:  Gradual Duration:  3 months Timing:  Constant Progression:  Worsening Chronicity:  New Relieved by:  Nothing Worsened by:  Nothing tried Ineffective treatments:  None tried   Past Medical History  Diagnosis Date  . Hypertension   . Prostate disorder   . Anxiety   . Asperger syndrome   . H/O suicide attempt     overdose "many years ago"  . Restless leg syndrome   . Depression   . Homelessness    History reviewed. No pertinent past surgical history. No family history on file. Social History  Substance Use Topics  . Smoking status: Light Tobacco Smoker -- 2 years    Types: Cigars  . Smokeless tobacco: Never Used  . Alcohol Use: No    Review of Systems  Constitutional: Positive for fatigue. Negative for fever.  HENT: Positive for congestion.   Respiratory: Positive for cough.   All other systems reviewed and are negative.     Allergies  Amoxicillin; Atarax; Cephalexin; Lemon oil; Tape; and Codeine  Home Medications   Prior to Admission medications   Medication Sig Start Date End  Date Taking? Authorizing Provider  amLODipine (NORVASC) 10 MG tablet Take 1 tablet (10 mg total) by mouth daily before supper. 09/02/15   Beau Fanny, FNP  ARIPiprazole (ABILIFY) 2 MG tablet Take 1 tablet (2 mg total) by mouth daily. Patient not taking: Reported on 01/16/2016 09/02/15   Beau Fanny, FNP  clonazePAM (KLONOPIN) 1 MG tablet Take 1 mg by mouth at bedtime.    Historical Provider, MD  FLUoxetine (PROZAC) 20 MG capsule Take 3 capsules (60 mg total) by mouth daily before supper. Patient taking differently: Take 40 mg by mouth daily after breakfast.  09/02/15   Beau Fanny, FNP  gabapentin (NEURONTIN) 400 MG capsule Take 1 capsule (400 mg total) by mouth 2 (two) times daily. 09/02/15   Beau Fanny, FNP  hydrOXYzine (ATARAX/VISTARIL) 25 MG tablet Take 1 tablet (25 mg total) by mouth every 6 (six) hours as needed for anxiety (insomnia). Patient not taking: Reported on 01/16/2016 09/02/15   Beau Fanny, FNP  lisinopril (PRINIVIL,ZESTRIL) 20 MG tablet Take 1 tablet (20 mg total) by mouth daily. 09/02/15   Beau Fanny, FNP  loratadine (CLARITIN) 10 MG tablet Take 1 tablet (10 mg total) by mouth daily. One po daily x 5 days 01/16/16   Arby Barrette, MD  nicotine (NICODERM CQ - DOSED IN MG/24 HOURS) 21 mg/24hr patch Place 1 patch (21 mg total) onto the skin daily. Patient not taking: Reported on 01/16/2016 09/02/15   Beau Fanny, FNP  ondansetron (  ZOFRAN) 4 MG tablet Take 1 tablet (4 mg total) by mouth every 6 (six) hours. Patient not taking: Reported on 01/18/2016 01/16/16   Arby Barrette, MD  promethazine (PHENERGAN) 25 MG tablet Take 1 tablet (25 mg total) by mouth every 6 (six) hours as needed for nausea or vomiting. 01/19/16   Elson Areas, PA-C  propranolol (INDERAL) 20 MG tablet Take 1 tablet (20 mg total) by mouth daily. 09/02/15   Beau Fanny, FNP  tamsulosin (FLOMAX) 0.4 MG CAPS capsule Take 1 capsule (0.4 mg total) by mouth daily after supper. 09/02/15   Everardo All Withrow, FNP    BP 128/98 mmHg  Pulse 121  Temp(Src) 98.5 F (36.9 C) (Oral)  Resp 20  Ht  (1.727 m)  Wt 173 lb (78.472 kg)  BMI 26.31 kg/m2  SpO2 100% Physical Exam  Constitutional: He is oriented to person, place, and time. He appears well-developed and well-nourished. No distress.  HENT:  Head: Normocephalic and atraumatic.  Mouth/Throat: Oropharynx is clear and moist.  Neck: Normal range of motion. Neck supple.  Cardiovascular: Normal rate, regular rhythm and normal heart sounds.   No murmur heard. Pulmonary/Chest: Effort normal and breath sounds normal. No respiratory distress. He has no wheezes. He has no rales.  Abdominal: Soft. Bowel sounds are normal. He exhibits no distension.  Musculoskeletal: Normal range of motion. He exhibits no edema.  Lymphadenopathy:    He has no cervical adenopathy.  Neurological: He is alert and oriented to person, place, and time.  Skin: Skin is warm and dry. He is not diaphoretic.  Nursing note and vitals reviewed.   ED Course  Procedures (including critical care time) Labs Review Labs Reviewed  CBC  BASIC METABOLIC PANEL  I-STAT TROPOININ, ED    Imaging Review No results found. I have personally reviewed and evaluated these images and lab results as part of my medical decision-making.   EKG Interpretation   Date/Time:  Wednesday February 03 2016 09:26:58 EST Ventricular Rate:  119 PR Interval:  174 QRS Duration: 88 QT Interval:  292 QTC Calculation: 410 R Axis:   64 Text Interpretation:  Sinus tachycardia Cannot rule out Anterior infarct ,  age undetermined Abnormal ECG Confirmed by Danee Soller  MD, Deven Audi ([id number]) on  02/03/2016 9:42:31 AM      MDM   Final diagnoses:  None    Patient is a 55 year old male who presents with persistent URI symptoms for several weeks. He had no relief with Bactrim and completed this prescription 5 days ago. His lungs are clear and chest x-ray reveals no evidence for pneumonia or other acute  cardiopulmonary process. Cardiac workup is unremarkable and his symptoms are extremely atypical for cardiac pain. He appears clinically well and nontoxic and is typing on his laptop computer and communicating with friends on Facebook. At this point, I see nothing emergent and believe he is more than appropriate for discharge. Due to the persistence of his symptoms, I will prescribe Zithromax. He is also requested a prescription for Phenergan as he feels nauseated.    Geoffery Lyons, MD 02/03/16 [phone]

## 2016-02-03 NOTE — Discharge Instructions (Signed)
Zithromax and Phenergan as prescribed.  Follow-up with your primary Dr. if not improving in the next week.   Upper Respiratory Infection, Adult Most upper respiratory infections (URIs) are a viral infection of the air passages leading to the lungs. A URI affects the nose, throat, and upper air passages. The most common type of URI is nasopharyngitis and is typically referred to as "the common cold." URIs run their course and usually go away on their own. Most of the time, a URI does not require medical attention, but sometimes a bacterial infection in the upper airways can follow a viral infection. This is called a secondary infection. Sinus and middle ear infections are common types of secondary upper respiratory infections. Bacterial pneumonia can also complicate a URI. A URI can worsen asthma and chronic obstructive pulmonary disease (COPD). Sometimes, these complications can require emergency medical care and may be life threatening.  CAUSES Almost all URIs are caused by viruses. A virus is a type of germ and can spread from one person to another.  RISKS FACTORS You may be at risk for a URI if:   You smoke.   You have chronic heart or lung disease.  You have a weakened defense (immune) system.   You are very young or very old.   You have nasal allergies or asthma.  You work in crowded or poorly ventilated areas.  You work in health care facilities or schools. SIGNS AND SYMPTOMS  Symptoms typically develop 2-3 days after you come in contact with a cold virus. Most viral URIs last 7-10 days. However, viral URIs from the influenza virus (flu virus) can last 14-18 days and are typically more severe. Symptoms may include:   Runny or stuffy (congested) nose.   Sneezing.   Cough.   Sore throat.   Headache.   Fatigue.   Fever.   Loss of appetite.   Pain in your forehead, behind your eyes, and over your cheekbones (sinus pain).  Muscle aches.  DIAGNOSIS  Your  health care provider may diagnose a URI by:  Physical exam.  Tests to check that your symptoms are not due to another condition such as:  Strep throat.  Sinusitis.  Pneumonia.  Asthma. TREATMENT  A URI goes away on its own with time. It cannot be cured with medicines, but medicines may be prescribed or recommended to relieve symptoms. Medicines may help:  Reduce your fever.  Reduce your cough.  Relieve nasal congestion. HOME CARE INSTRUCTIONS   Take medicines only as directed by your health care provider.   Gargle warm saltwater or take cough drops to comfort your throat as directed by your health care provider.  Use a warm mist humidifier or inhale steam from a shower to increase air moisture. This may make it easier to breathe.  Drink enough fluid to keep your urine clear or pale yellow.   Eat soups and other clear broths and maintain good nutrition.   Rest as needed.   Return to work when your temperature has returned to normal or as your health care provider advises. You may need to stay home longer to avoid infecting others. You can also use a face mask and careful hand washing to prevent spread of the virus.  Increase the usage of your inhaler if you have asthma.   Do not use any tobacco products, including cigarettes, chewing tobacco, or electronic cigarettes. If you need help quitting, ask your health care provider. PREVENTION  The best way to protect yourself  from getting a cold is to practice good hygiene.   Avoid oral or hand contact with people with cold symptoms.   Wash your hands often if contact occurs.  There is no clear evidence that vitamin C, vitamin E, echinacea, or exercise reduces the chance of developing a cold. However, it is always recommended to get plenty of rest, exercise, and practice good nutrition.  SEEK MEDICAL CARE IF:   You are getting worse rather than better.   Your symptoms are not controlled by medicine.   You have  chills.  You have worsening shortness of breath.  You have brown or red mucus.  You have yellow or brown nasal discharge.  You have pain in your face, especially when you bend forward.  You have a fever.  You have swollen neck glands.  You have pain while swallowing.  You have white areas in the back of your throat. SEEK IMMEDIATE MEDICAL CARE IF:   You have severe or persistent:  Headache.  Ear pain.  Sinus pain.  Chest pain.  You have chronic lung disease and any of the following:  Wheezing.  Prolonged cough.  Coughing up blood.  A change in your usual mucus.  You have a stiff neck.  You have changes in your:  Vision.  Hearing.  Thinking.  Mood. MAKE SURE YOU:   Understand these instructions.  Will watch your condition.  Will get help right away if you are not doing well or get worse.   This information is not intended to replace advice given to you by your health care provider. Make sure you discuss any questions you have with your health care provider.   Document Released: 05/31/2001 Document Revised: 04/21/2015 Document Reviewed: 03/12/2014 Elsevier Interactive Patient Education [employer]

## 2016-02-03 NOTE — ED Notes (Signed)
Patient in by EMS, complained to EMS that he "has a sinus infection".  Per EMS, patient states he just came off his bactrim on Sunday.   In triage, patient states chest discomfort, lightheadedness and sinus infection.    Patient has multiple complaints at triage.

## 2016-02-03 NOTE — ED Notes (Signed)
Patient transported to X-ray 

## 2016-02-03 NOTE — ED Notes (Signed)
Pt ambulates independently and with steady gait at time of discharge. Discharge instructions and follow up information reviewed with patient. No other questions or concerns voiced at this time. RX x 2 given. 

## 2016-04-07 ENCOUNTER — Emergency Department (HOSPITAL_COMMUNITY)
Admission: EM | Admit: 2016-04-07 | Discharge: 2016-04-07 | Disposition: A | Discharge status: Home / Self Care | Payer: Medicaid Other

## 2016-04-07 NOTE — ED Notes (Signed)
Called x 3.  No answer.   Per Malachi BondsIda, NT, patient left.

## 2016-05-08 ENCOUNTER — Emergency Department (HOSPITAL_COMMUNITY)
Admission: EM | Admit: 2016-05-08 | Discharge: 2016-05-08 | Disposition: A | Discharge status: Home / Self Care | Payer: Medicaid Other | Attending: Emergency Medicine | Admitting: Emergency Medicine

## 2016-05-08 ENCOUNTER — Encounter (HOSPITAL_COMMUNITY): Payer: Self-pay

## 2016-05-08 DIAGNOSIS — F17210 Nicotine dependence, cigarettes, uncomplicated: Secondary | ICD-10-CM | POA: Diagnosis not present

## 2016-05-08 DIAGNOSIS — M545 Low back pain, unspecified: Secondary | ICD-10-CM

## 2016-05-08 DIAGNOSIS — F419 Anxiety disorder, unspecified: Secondary | ICD-10-CM | POA: Insufficient documentation

## 2016-05-08 DIAGNOSIS — F329 Major depressive disorder, single episode, unspecified: Secondary | ICD-10-CM | POA: Insufficient documentation

## 2016-05-08 DIAGNOSIS — Z79899 Other long term (current) drug therapy: Secondary | ICD-10-CM | POA: Diagnosis not present

## 2016-05-08 DIAGNOSIS — Z880 Allergy status to penicillin: Secondary | ICD-10-CM | POA: Insufficient documentation

## 2016-05-08 DIAGNOSIS — Z915 Personal history of self-harm: Secondary | ICD-10-CM | POA: Diagnosis not present

## 2016-05-08 DIAGNOSIS — Z590 Homelessness: Secondary | ICD-10-CM | POA: Insufficient documentation

## 2016-05-08 DIAGNOSIS — I10 Essential (primary) hypertension: Secondary | ICD-10-CM | POA: Insufficient documentation

## 2016-05-08 DIAGNOSIS — G2581 Restless legs syndrome: Secondary | ICD-10-CM | POA: Diagnosis not present

## 2016-05-08 MED ORDER — DIAZEPAM 5 MG PO TABS: 5.0000 mg | ORAL_TABLET | Freq: Two times a day (BID) | ORAL | Status: DC

## 2016-05-08 MED ORDER — DIAZEPAM 5 MG PO TABS
5.0000 mg | ORAL_TABLET | Freq: Once | ORAL | Status: AC
Start: 2016-05-08 — End: 2016-05-08
  Administered 2016-05-08: 5 mg via ORAL
  Filled 2016-05-08: qty 1

## 2016-05-08 MED ORDER — NAPROXEN 500 MG PO TABS: 500.0000 mg | ORAL_TABLET | Freq: Two times a day (BID) | ORAL | Status: DC

## 2016-05-08 MED ORDER — NAPROXEN 250 MG PO TABS
500.0000 mg | ORAL_TABLET | Freq: Once | ORAL | Status: AC
  Administered 2016-05-08: 500 mg via ORAL
  Filled 2016-05-08: qty 2

## 2016-05-08 NOTE — Discharge Instructions (Signed)
Mr. [name] Rockwood,  Nice meeting you! Please follow-up with your primary care provider. Return to the emergency department if you develop increased pain, fevers, chills, nausea/vomiting. Feel better soon!  S. Lane HackerNicole Nihal Doan, PA-C Back Pain, Adult Back pain is very common in adults.The cause of back pain is rarely dangerous and the pain often gets better over time.The cause of your back pain may not be known. Some common causes of back pain include:  Strain of the muscles or ligaments supporting the spine.  Wear and tear (degeneration) of the spinal disks.  Arthritis.  Direct injury to the back. For many people, back pain may return. Since back pain is rarely dangerous, most people can learn to manage this condition on their own. HOME CARE INSTRUCTIONS Watch your back pain for any changes. The following actions may help to lessen any discomfort you are feeling:  Remain active. It is stressful on your back to sit or stand in one place for long periods of time. Do not sit, drive, or stand in one place for more than 30 minutes at a time. Take short walks on even surfaces as soon as you are able.Try to increase the length of time you walk each day.  Exercise regularly as directed by your health care provider. Exercise helps your back heal faster. It also helps avoid future injury by keeping your muscles strong and flexible.  Do not stay in bed.Resting more than 1-2 days can delay your recovery.  Pay attention to your body when you bend and lift. The most comfortable positions are those that put less stress on your recovering back. Always use proper lifting techniques, including:  Bending your knees.  Keeping the load close to your body.  Avoiding twisting.  Find a comfortable position to sleep. Use a firm mattress and lie on your side with your knees slightly bent. If you lie on your back, put a pillow under your knees.  Avoid feeling anxious or stressed.Stress increases muscle tension  and can worsen back pain.It is important to recognize when you are anxious or stressed and learn ways to manage it, such as with exercise.  Take medicines only as directed by your health care provider. Over-the-counter medicines to reduce pain and inflammation are often the most helpful.Your health care provider may prescribe muscle relaxant drugs.These medicines help dull your pain so you can more quickly return to your normal activities and healthy exercise.  Apply ice to the injured area:  Put ice in a plastic bag.  Place a towel between your skin and the bag.  Leave the ice on for 20 minutes, 2-3 times a day for the first 2-3 days. After that, ice and heat may be alternated to reduce pain and spasms.  Maintain a healthy weight. Excess weight puts extra stress on your back and makes it difficult to maintain good posture. SEEK MEDICAL CARE IF:  You have pain that is not relieved with rest or medicine.  You have increasing pain going down into the legs or buttocks.  You have pain that does not improve in one week.  You have night pain.  You lose weight.  You have a fever or chills. SEEK IMMEDIATE MEDICAL CARE IF:   You develop new bowel or bladder control problems.  You have unusual weakness or numbness in your arms or legs.  You develop nausea or vomiting.  You develop abdominal pain.  You feel faint.   This information is not intended to replace advice given to you  by your health care provider. Make sure you discuss any questions you have with your health care provider.   Document Released: 12/05/2005 Document Revised: 12/26/2014 Document Reviewed: 04/08/2014 Elsevier Interactive Patient Education [employer]

## 2016-05-08 NOTE — ED Notes (Signed)
Patient here with back pain after sleeping on bad mattress. Reports that he is having spasms to back in thoracic area with movement. Also wants his sinuses checked because has had increased drainage for the past few days

## 2016-05-08 NOTE — ED Notes (Signed)
Declined W/C at D/C and was escorted to lobby by RN. 

## 2016-05-08 NOTE — ED Provider Notes (Signed)
CSN: [id number]     Arrival date & time 05/08/16  1117 History   First MD Initiated Contact with Patient 05/08/16 1225     Chief Complaint  Patient presents with  . Back Pain   HPI   Zachary Russo is a 55 y.o. male PMH significant for HTN, depression, anxiety, homelessness presenting with a few week history of back pain. He states his pain began after sleeping on a bad mattress, is constant, is worsened with certain movements, spasm-like, 5/10 pain scale, nonradiating, location is along right sided back but especially in mid to lower back. He denies fevers, chills, chest pain, shortness of breath, abdominal pain, nausea, vomiting, loss of bowel or bladder control, change in bowel or bladder habits, IV drug use, recent steroid injections.  Past Medical History  Diagnosis Date  . Hypertension   . Prostate disorder   . Anxiety   . Asperger syndrome   . H/O suicide attempt     overdose "many years ago"  . Restless leg syndrome   . Depression   . Homelessness    History reviewed. No pertinent past surgical history. No family history on file. Social History  Substance Use Topics  . Smoking status: Light Tobacco Smoker -- 2 years    Types: Cigars  . Smokeless tobacco: Never Used  . Alcohol Use: No    Review of Systems  Ten systems are reviewed and are negative for acute change except as noted in the HPI  Allergies  Amoxicillin; Atarax; Cephalexin; Lemon oil; Tape; and Codeine  Home Medications   Prior to Admission medications   Medication Sig Start Date End Date Taking? Authorizing Provider  amLODipine (NORVASC) 10 MG tablet Take 1 tablet (10 mg total) by mouth daily before supper. 09/02/15   Beau Fanny, FNP  ARIPiprazole (ABILIFY) 2 MG tablet Take 1 tablet (2 mg total) by mouth daily. Patient not taking: Reported on 01/16/2016 09/02/15   Beau Fanny, FNP  azithromycin (ZITHROMAX Z-PAK) 250 MG tablet 2 po day one, then 1 daily x 4 days 02/03/16   Geoffery Lyons, MD  clonazePAM  (KLONOPIN) 1 MG tablet Take 1 mg by mouth at bedtime.    Historical Provider, MD  FLUoxetine (PROZAC) 20 MG capsule Take 3 capsules (60 mg total) by mouth daily before supper. Patient taking differently: Take 40 mg by mouth daily after breakfast.  09/02/15   Beau Fanny, FNP  gabapentin (NEURONTIN) 400 MG capsule Take 1 capsule (400 mg total) by mouth 2 (two) times daily. 09/02/15   Beau Fanny, FNP  hydrOXYzine (ATARAX/VISTARIL) 25 MG tablet Take 1 tablet (25 mg total) by mouth every 6 (six) hours as needed for anxiety (insomnia). Patient not taking: Reported on 01/16/2016 09/02/15   Beau Fanny, FNP  lisinopril (PRINIVIL,ZESTRIL) 20 MG tablet Take 1 tablet (20 mg total) by mouth daily. 09/02/15   Beau Fanny, FNP  loratadine (CLARITIN) 10 MG tablet Take 1 tablet (10 mg total) by mouth daily. One po daily x 5 days 01/16/16   Arby Barrette, MD  nicotine (NICODERM CQ - DOSED IN MG/24 HOURS) 21 mg/24hr patch Place 1 patch (21 mg total) onto the skin daily. Patient not taking: Reported on 01/16/2016 09/02/15   Beau Fanny, FNP  ondansetron (ZOFRAN) 4 MG tablet Take 1 tablet (4 mg total) by mouth every 6 (six) hours. Patient not taking: Reported on 01/18/2016 01/16/16   Arby Barrette, MD  promethazine (PHENERGAN) 25 MG tablet Take 1 tablet (  25 mg total) by mouth every 6 (six) hours as needed for nausea. 02/03/16   Geoffery Lyonsouglas Delo, MD  propranolol (INDERAL) 20 MG tablet Take 1 tablet (20 mg total) by mouth daily. 09/02/15   Beau FannyJohn C Withrow, FNP  tamsulosin (FLOMAX) 0.4 MG CAPS capsule Take 1 capsule (0.4 mg total) by mouth daily after supper. 09/02/15   Everardo AllJohn C Withrow, FNP   BP 127/101 mmHg  Pulse 113  Temp(Src) 98.1 F (36.7 C) (Oral)  Resp 16  SpO2 100% Physical Exam  Constitutional: He appears well-developed and well-nourished. No distress.  HENT:  Head: Normocephalic and atraumatic.  Mouth/Throat: Oropharynx is clear and moist. No oropharyngeal exudate.  Eyes: Conjunctivae are normal. Right  eye exhibits no discharge. Left eye exhibits no discharge. No scleral icterus.  Neck: No tracheal deviation present.  Cardiovascular: Normal rate.   Pulmonary/Chest: Effort normal. No respiratory distress.  Abdominal: Soft. He exhibits no distension.  Musculoskeletal: Normal range of motion. He exhibits tenderness. He exhibits no edema.  Strength 5/5 throughout. Neurovascularly intact bilaterally. No midline tenderness along cervical, thoracic, lumbar spine. Paraspinal tenderness along right sided back diffusely.  Lymphadenopathy:    He has no cervical adenopathy.  Neurological: He is alert. Coordination normal.  Skin: Skin is warm and dry. No rash noted. He is not diaphoretic. No erythema.  Psychiatric: He has a normal mood and affect. His behavior is normal.  Nursing note and vitals reviewed.   ED Course  Procedures   MDM   Final diagnoses:  Right-sided low back pain without sciatica   Patient with back pain.  No neurological deficits and normal neuro exam.  Patient can walk but states is painful.  No loss of bowel or bladder control.  No concern for cauda equina.  No fever, night sweats, weight loss, h/o cancer, IVDU.  RICE protocol and pain medicine indicated and discussed with patient.    Melton KrebsSamantha Nicole Gennie Eisinger, PA-C 05/08/16 1258  Pricilla LovelessScott Goldston, MD 05/09/16 [phone]

## 2016-06-03 ENCOUNTER — Emergency Department (HOSPITAL_COMMUNITY)
Admission: EM | Admit: 2016-06-03 | Discharge: 2016-06-03 | Disposition: A | Discharge status: Home / Self Care | Payer: Medicaid Other | Attending: Dermatology | Admitting: Dermatology

## 2016-06-03 ENCOUNTER — Emergency Department (HOSPITAL_COMMUNITY)
Admission: EM | Admit: 2016-06-03 | Discharge: 2016-06-03 | Disposition: A | Discharge status: Home / Self Care | Payer: Medicaid Other | Attending: Emergency Medicine | Admitting: Emergency Medicine

## 2016-06-03 ENCOUNTER — Encounter (HOSPITAL_COMMUNITY): Payer: Self-pay | Admitting: Emergency Medicine

## 2016-06-03 ENCOUNTER — Encounter (HOSPITAL_COMMUNITY): Payer: Self-pay

## 2016-06-03 DIAGNOSIS — N390 Urinary tract infection, site not specified: Secondary | ICD-10-CM | POA: Diagnosis not present

## 2016-06-03 DIAGNOSIS — F17210 Nicotine dependence, cigarettes, uncomplicated: Secondary | ICD-10-CM | POA: Insufficient documentation

## 2016-06-03 DIAGNOSIS — Z760 Encounter for issue of repeat prescription: Secondary | ICD-10-CM | POA: Insufficient documentation

## 2016-06-03 DIAGNOSIS — I10 Essential (primary) hypertension: Secondary | ICD-10-CM | POA: Insufficient documentation

## 2016-06-03 DIAGNOSIS — Z5321 Procedure and treatment not carried out due to patient leaving prior to being seen by health care provider: Secondary | ICD-10-CM | POA: Insufficient documentation

## 2016-06-03 DIAGNOSIS — F329 Major depressive disorder, single episode, unspecified: Secondary | ICD-10-CM | POA: Insufficient documentation

## 2016-06-03 DIAGNOSIS — Z79899 Other long term (current) drug therapy: Secondary | ICD-10-CM | POA: Diagnosis not present

## 2016-06-03 DIAGNOSIS — R339 Retention of urine, unspecified: Secondary | ICD-10-CM | POA: Diagnosis present

## 2016-06-03 LAB — URINALYSIS, ROUTINE W REFLEX MICROSCOPIC
Bilirubin Urine: NEGATIVE
GLUCOSE, UA: NEGATIVE mg/dL
Ketones, ur: NEGATIVE mg/dL
NITRITE: POSITIVE — AB
Protein, ur: 30 mg/dL — AB
Specific Gravity, Urine: 1.026 (ref 1.005–1.030)
pH: 6.5 (ref 5.0–8.0)

## 2016-06-03 LAB — URINE MICROSCOPIC-ADD ON

## 2016-06-03 MED ORDER — CLONIDINE HCL 0.1 MG PO TABS: 0.1000 mg | ORAL_TABLET | Freq: Every day | ORAL | Status: DC

## 2016-06-03 MED ORDER — TAMSULOSIN HCL 0.4 MG PO CAPS: 0.8000 mg | ORAL_CAPSULE | Freq: Every day | ORAL | Status: DC

## 2016-06-03 MED ORDER — GABAPENTIN 400 MG PO CAPS: 400.0000 mg | ORAL_CAPSULE | Freq: Two times a day (BID) | ORAL | Status: DC

## 2016-06-03 MED ORDER — DIAZEPAM 5 MG PO TABS: 5.0000 mg | ORAL_TABLET | Freq: Every day | ORAL | Status: DC | PRN

## 2016-06-03 MED ORDER — PROPRANOLOL HCL 20 MG PO TABS: 20.0000 mg | ORAL_TABLET | Freq: Every day | ORAL | Status: DC

## 2016-06-03 MED ORDER — AMLODIPINE BESYLATE 10 MG PO TABS: 10.0000 mg | ORAL_TABLET | Freq: Every day | ORAL | Status: DC

## 2016-06-03 MED ORDER — FLUOXETINE HCL 40 MG PO CAPS: 40.0000 mg | ORAL_CAPSULE | Freq: Every day | ORAL | Status: DC

## 2016-06-03 MED ORDER — CIPROFLOXACIN HCL 500 MG PO TABS: 500.0000 mg | ORAL_TABLET | Freq: Two times a day (BID) | ORAL | Status: DC

## 2016-06-03 MED ORDER — LISINOPRIL 20 MG PO TABS: 20.0000 mg | ORAL_TABLET | Freq: Every day | ORAL | Status: DC

## 2016-06-03 MED ORDER — PROMETHAZINE HCL 25 MG PO TABS: 25.0000 mg | ORAL_TABLET | Freq: Four times a day (QID) | ORAL | Status: DC | PRN

## 2016-06-03 MED ORDER — CIPROFLOXACIN HCL 500 MG PO TABS
500.0000 mg | ORAL_TABLET | Freq: Once | ORAL | Status: AC
  Administered 2016-06-03: 500 mg via ORAL
  Filled 2016-06-03: qty 1

## 2016-06-03 NOTE — ED Notes (Signed)
Pt given urinal and made aware of need for urine specimen 

## 2016-06-03 NOTE — ED Notes (Signed)
Emergency Director and MD at bedside.

## 2016-06-03 NOTE — ED Notes (Signed)
MD at bedside. 

## 2016-06-03 NOTE — ED Notes (Signed)
While triaging pt became very agitated with RNs questions; Pt got loud with RN and states he wants to leave; RN unhooked pt and told him he was free to leave whenever he likes; pt demanded to speak with charge RN; Pt standing in hallway yelling; Charge RN spoke with pt; Pt left without being seen.

## 2016-06-03 NOTE — ED Notes (Signed)
Pt stated main issue concerned w/is his prostate. Pt takes BP medication.

## 2016-06-03 NOTE — ED Provider Notes (Signed)
CSN: [id number]     Arrival date & time 06/03/16  [id number] History   First MD Initiated Contact with Patient 06/03/16 0730     Chief Complaint  Patient presents with  . Hypertension  . Urinary Retention     (Consider location/radiation/quality/duration/timing/severity/associated sxs/prior Treatment) HPI Patient presents with concern of difficulty urinating. He notes over the past 2 or 3 days he has had persistent sensation of full bladder, but when he attempts to urinate has had minimal urine production. There is mild associated lower abdominal discomfort, no other abdominal pain, nausea, fever, vomiting, diarrhea. Patient notes that he ran out of his medication for prostate disease. Patient denies other complaints, states that he has been generally well. Patient recently obtained his first dose after being homeless for some time.  Past Medical History  Diagnosis Date  . Hypertension   . Prostate disorder   . Anxiety   . Asperger syndrome   . H/O suicide attempt     overdose "many years ago"  . Restless leg syndrome   . Depression   . Homelessness    History reviewed. No pertinent past surgical history. No family history on file. Social History  Substance Use Topics  . Smoking status: Light Tobacco Smoker -- 2 years    Types: Cigars  . Smokeless tobacco: Never Used  . Alcohol Use: No    Review of Systems  Constitutional:       Per HPI, otherwise negative  HENT:       Per HPI, otherwise negative  Respiratory:       Per HPI, otherwise negative  Cardiovascular:       Per HPI, otherwise negative  Gastrointestinal: Negative for vomiting.  Endocrine:       Negative aside from HPI  Genitourinary:       Neg aside from HPI   Musculoskeletal:       Per HPI, otherwise negative  Skin: Negative.   Neurological: Negative for syncope.      Allergies  Amoxicillin; Atarax; Cephalexin; Lemon oil; Tape; and Codeine  Home Medications   Prior to Admission medications    Medication Sig Start Date End Date Taking? Authorizing Provider  amLODipine (NORVASC) 10 MG tablet Take 1 tablet (10 mg total) by mouth daily before supper. 09/02/15   Beau FannyJohn C Withrow, FNP  ARIPiprazole (ABILIFY) 2 MG tablet Take 1 tablet (2 mg total) by mouth daily. Patient not taking: Reported on 01/16/2016 09/02/15   Beau FannyJohn C Withrow, FNP  azithromycin (ZITHROMAX Z-PAK) 250 MG tablet 2 po day one, then 1 daily x 4 days 02/03/16   Geoffery Lyonsouglas Delo, MD  clonazePAM (KLONOPIN) 1 MG tablet Take 1 mg by mouth at bedtime.    Historical Provider, MD  diazepam (VALIUM) 5 MG tablet Take 1 tablet (5 mg total) by mouth 2 (two) times daily. 05/08/16   Melton KrebsSamantha Nicole Riley, PA-C  FLUoxetine (PROZAC) 20 MG capsule Take 3 capsules (60 mg total) by mouth daily before supper. Patient taking differently: Take 40 mg by mouth daily after breakfast.  09/02/15   Beau FannyJohn C Withrow, FNP  gabapentin (NEURONTIN) 400 MG capsule Take 1 capsule (400 mg total) by mouth 2 (two) times daily. 09/02/15   Beau FannyJohn C Withrow, FNP  hydrOXYzine (ATARAX/VISTARIL) 25 MG tablet Take 1 tablet (25 mg total) by mouth every 6 (six) hours as needed for anxiety (insomnia). Patient not taking: Reported on 01/16/2016 09/02/15   Beau FannyJohn C Withrow, FNP  lisinopril (PRINIVIL,ZESTRIL) 20 MG tablet Take 1 tablet (20  mg total) by mouth daily. 09/02/15   Beau Fanny, FNP  loratadine (CLARITIN) 10 MG tablet Take 1 tablet (10 mg total) by mouth daily. One po daily x 5 days 01/16/16   Arby Barrette, MD  naproxen (NAPROSYN) 500 MG tablet Take 1 tablet (500 mg total) by mouth 2 (two) times daily. 05/08/16   Melton Krebs, PA-C  nicotine (NICODERM CQ - DOSED IN MG/24 HOURS) 21 mg/24hr patch Place 1 patch (21 mg total) onto the skin daily. Patient not taking: Reported on 01/16/2016 09/02/15   Beau Fanny, FNP  ondansetron (ZOFRAN) 4 MG tablet Take 1 tablet (4 mg total) by mouth every 6 (six) hours. Patient not taking: Reported on 01/18/2016 01/16/16   Arby Barrette, MD   promethazine (PHENERGAN) 25 MG tablet Take 1 tablet (25 mg total) by mouth every 6 (six) hours as needed for nausea. 02/03/16   Geoffery Lyons, MD  propranolol (INDERAL) 20 MG tablet Take 1 tablet (20 mg total) by mouth daily. 09/02/15   Beau Fanny, FNP  tamsulosin (FLOMAX) 0.4 MG CAPS capsule Take 1 capsule (0.4 mg total) by mouth daily after supper. 09/02/15   Everardo All Withrow, FNP   BP 156/117 mmHg  Pulse 92  Temp(Src) 98.3 F (36.8 C)  Resp 18  SpO2 100% Physical Exam  Constitutional: He is oriented to person, place, and time. He appears well-developed. No distress.  HENT:  Head: Normocephalic and atraumatic.  Eyes: Conjunctivae and EOM are normal.  Cardiovascular: Normal rate and regular rhythm.   Pulmonary/Chest: Effort normal. No stridor. No respiratory distress.  Abdominal: He exhibits no distension.  Mild lower abdominal tenderness to palpation, no guarding, rebound, distention  Musculoskeletal: He exhibits no edema.  Neurological: He is alert and oriented to person, place, and time.  Skin: Skin is warm and dry.  Psychiatric: He has a normal mood and affect.  Nursing note and vitals reviewed.   ED Course  Procedures (including critical care time) Labs Review Labs Reviewed  URINALYSIS, ROUTINE W REFLEX MICROSCOPIC (NOT AT [facility]) - Abnormal; Notable for the following:    APPearance TURBID (*)    Hgb urine dipstick SMALL (*)    Protein, ur 30 (*)    Nitrite POSITIVE (*)    Leukocytes, UA LARGE (*)    All other components within normal limits  URINE MICROSCOPIC-ADD ON - Abnormal; Notable for the following:    Squamous Epithelial / LPF 0-5 (*)    Bacteria, UA MANY (*)    All other components within normal limits   On repeat exam patient in no distress, sitting upright, eating. Bladder scan demonstrated 260 mL in the bladder, the patient has been able to void. We discussed his urinary tract infection, need to take the newly prescribed antibiotics as well as use the  prescribed medication, follow-up with primary care.  MDM  Patient presents with ongoing dysuria, frequency of voiding. Here the patient is afebrile, awake, alert, hemodynamically stable. No evidence for urinary retention, though there is evidence for infection. Patient was provided refills of multiple prescriptions, as well as new antibiotics. Patient will follow-up with primary care.   Gerhard Munch, MD 06/03/16 [phone]

## 2016-06-03 NOTE — ED Notes (Signed)
Per pt he has prostate meds that he has been out of  For a week; pt states his PCP can not see him til the end of next month so he came to Ed to get meds filled; Pt denies pain on arrival.

## 2016-06-03 NOTE — ED Notes (Signed)
Pt Attempting to use urinal

## 2016-06-03 NOTE — Discharge Instructions (Signed)
As discussed, your evaluation today has been largely reassuring.  But, it is important that you monitor your condition carefully, and do not hesitate to return to the ED if you develop new, or concerning changes in your condition. ? ?Otherwise, please follow-up with your physician for appropriate ongoing care. ? ?

## 2016-06-03 NOTE — ED Notes (Signed)
Pt c/o trouble sleeping r/t urinary retention and pressure. Pt states he ran out of Flomax and his blood pressure medications a week ago.

## 2016-09-05 IMAGING — DX DG CHEST 2V
2 series · 2 of 2 positions shown · non-contrast
Comparison: 01/16/2016

CLINICAL DATA: Chest pain and shortness of breath for 1 day

EXAM:
CHEST  2 VIEW

[chest pa]
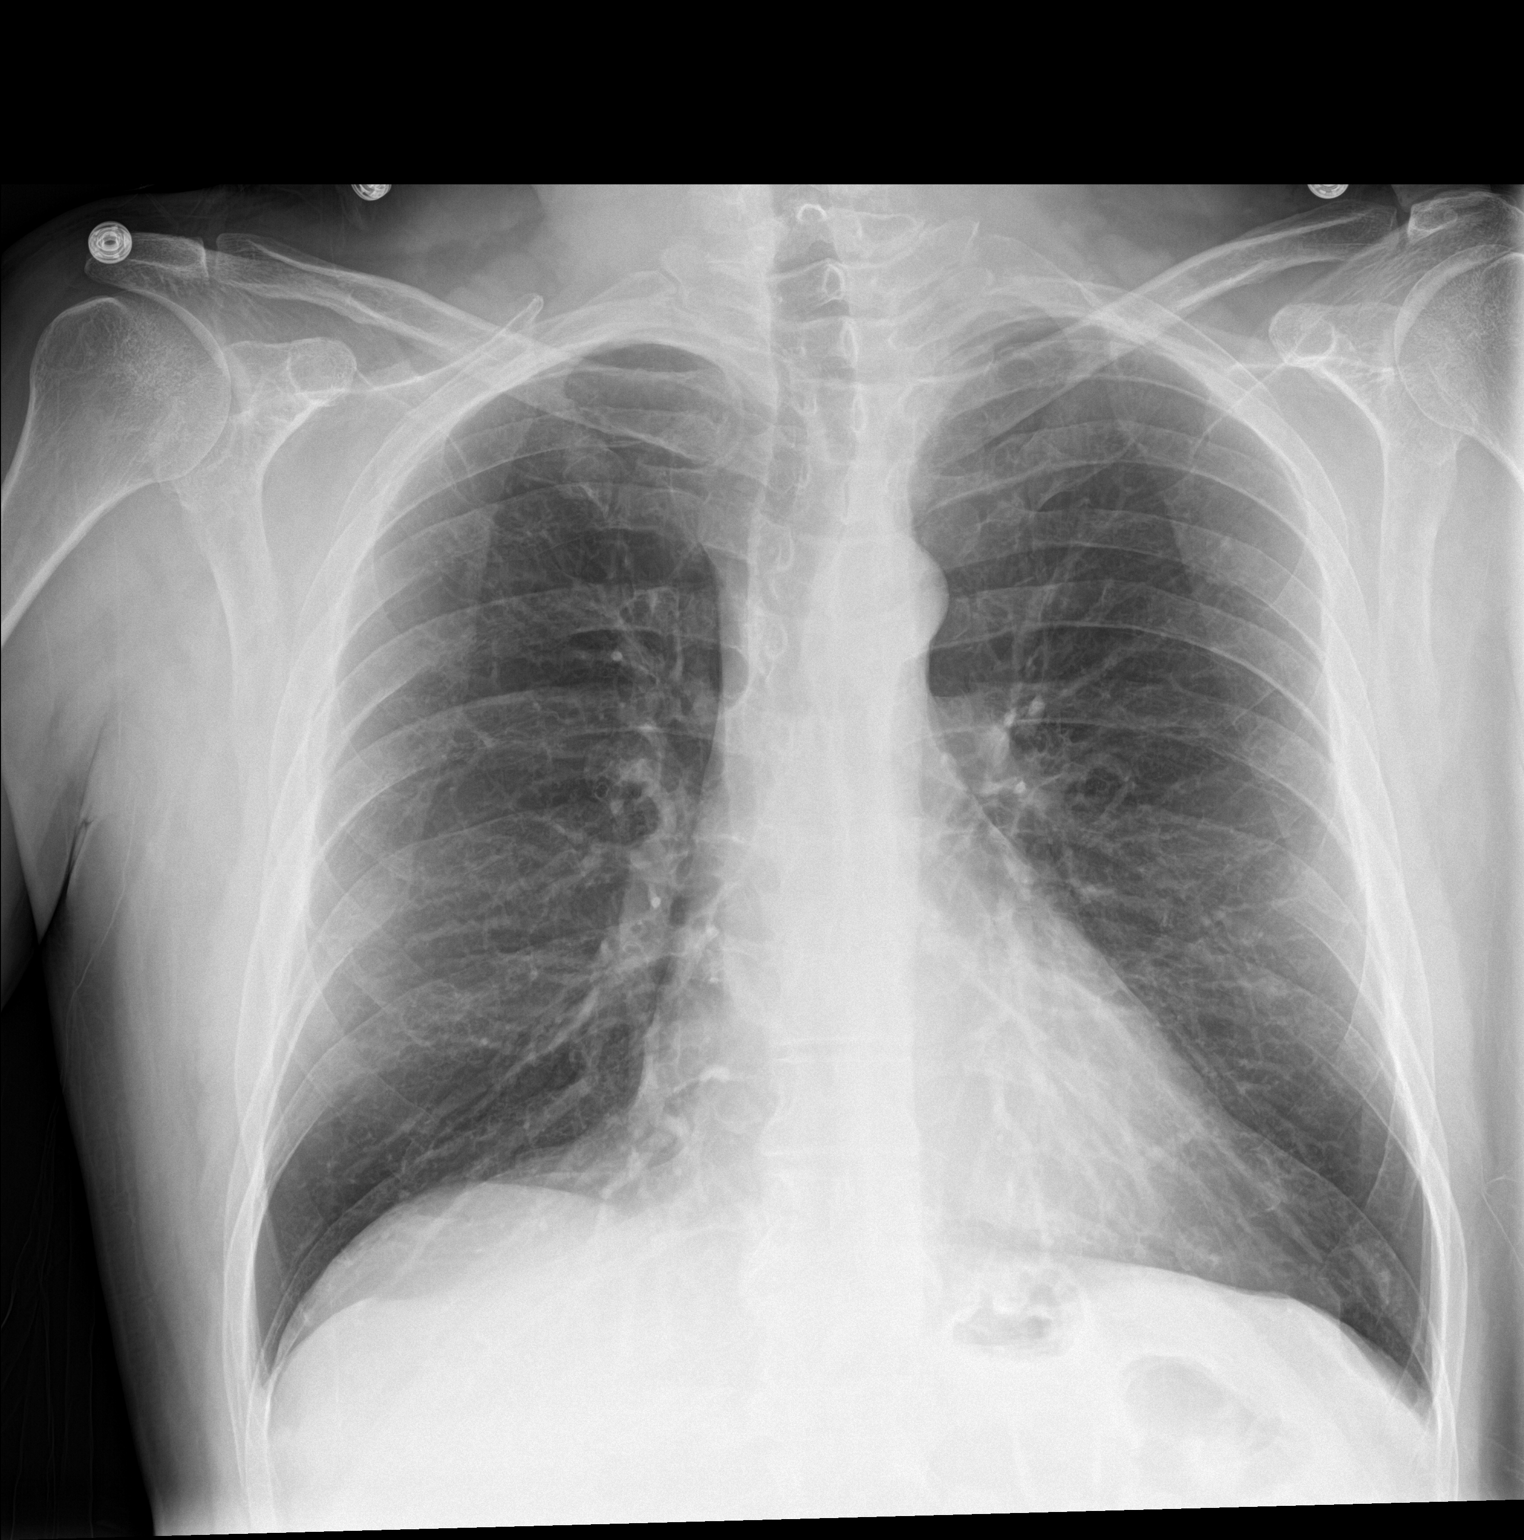

[chest lat]
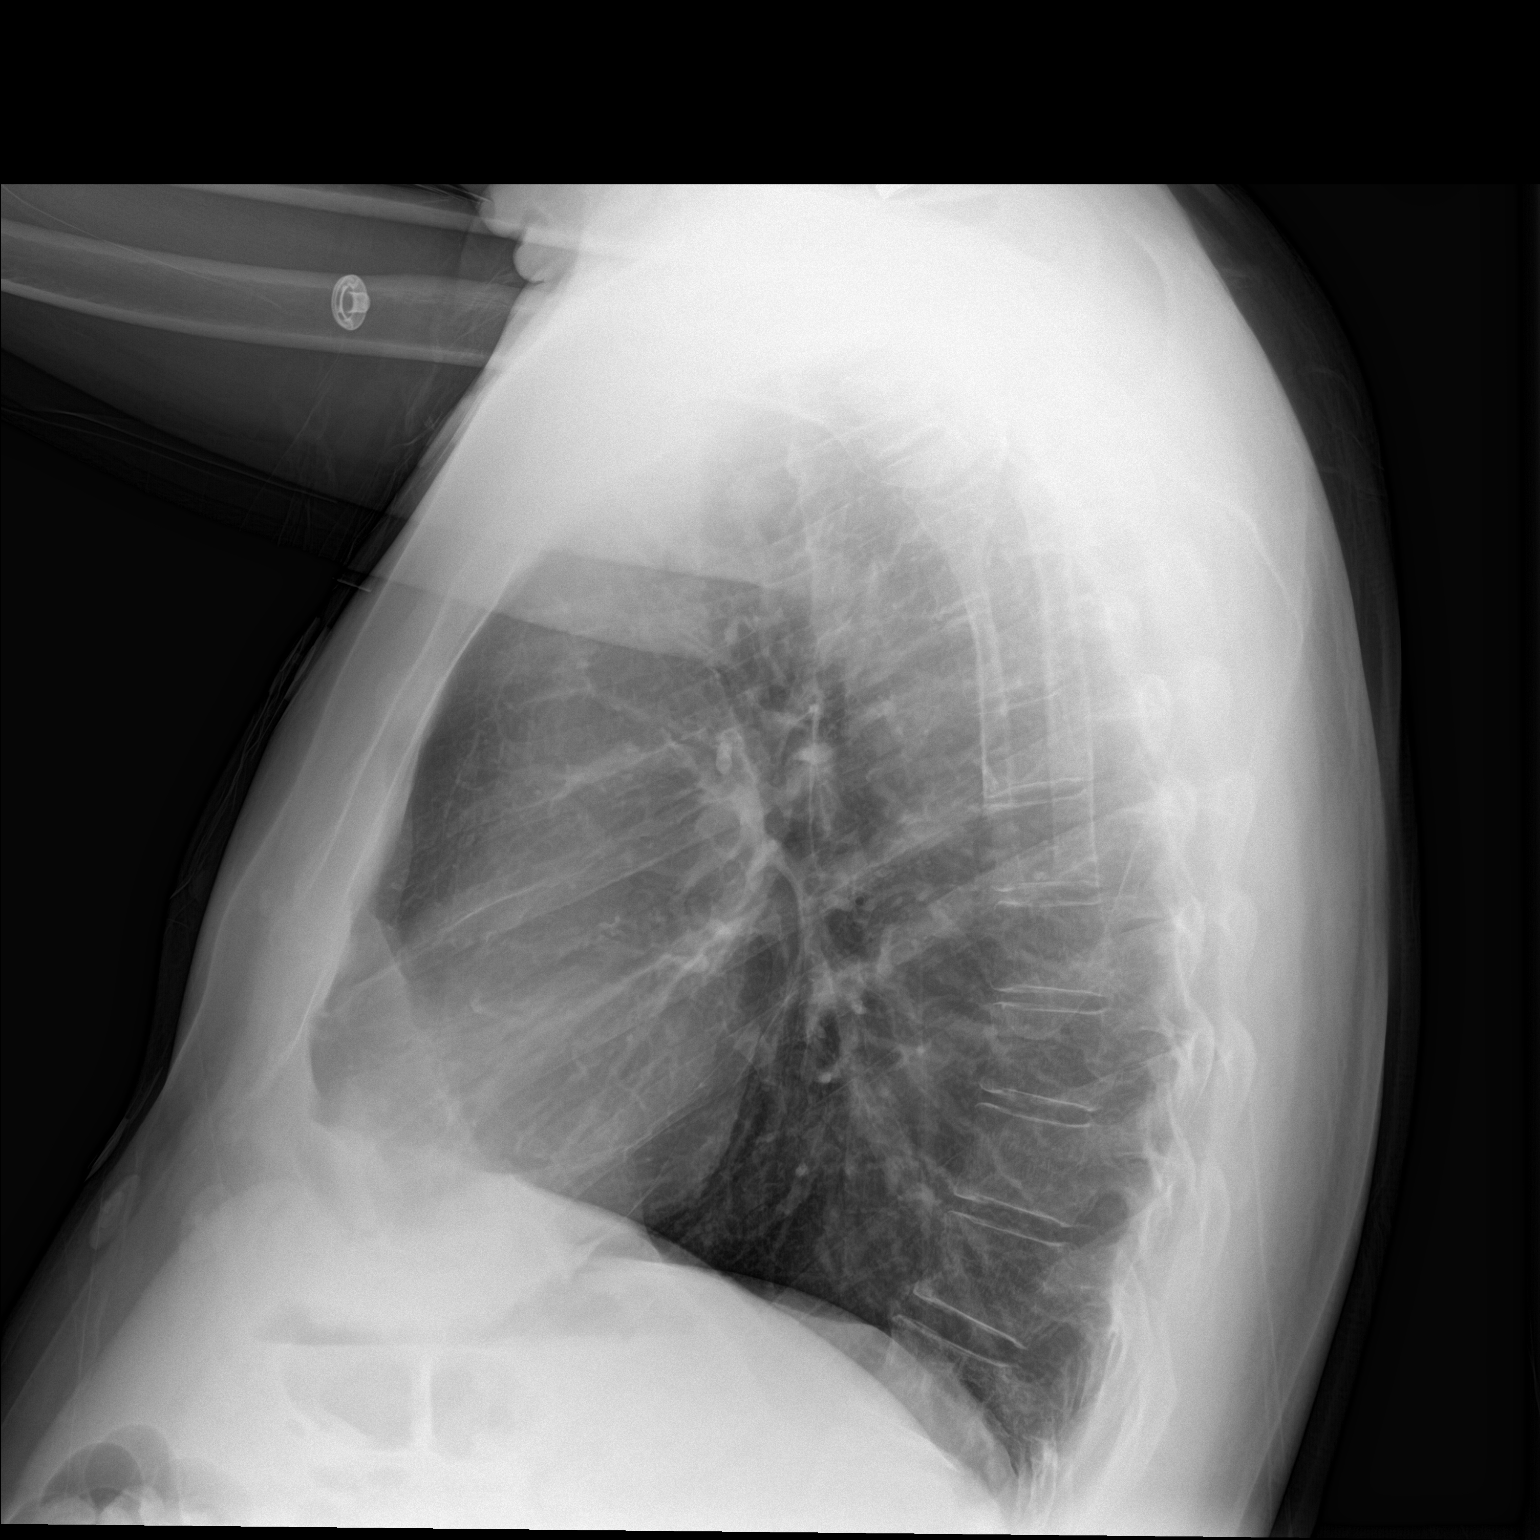

[2 of 2 positions shown; findings below may reference images not displayed]

FINDINGS: The heart size and mediastinal contours are within normal limits.
Both lungs are clear. The visualized skeletal structures are
unremarkable.
IMPRESSION: No active cardiopulmonary disease.

## 2016-10-03 DIAGNOSIS — N390 Urinary tract infection, site not specified: Secondary | ICD-10-CM | POA: Insufficient documentation

## 2016-10-03 DIAGNOSIS — Z79899 Other long term (current) drug therapy: Secondary | ICD-10-CM | POA: Insufficient documentation

## 2016-10-03 DIAGNOSIS — J0191 Acute recurrent sinusitis, unspecified: Secondary | ICD-10-CM | POA: Insufficient documentation

## 2016-10-03 DIAGNOSIS — I10 Essential (primary) hypertension: Secondary | ICD-10-CM | POA: Insufficient documentation

## 2016-10-03 DIAGNOSIS — R350 Frequency of micturition: Secondary | ICD-10-CM | POA: Diagnosis not present

## 2016-10-03 DIAGNOSIS — Z5321 Procedure and treatment not carried out due to patient leaving prior to being seen by health care provider: Secondary | ICD-10-CM | POA: Insufficient documentation

## 2016-10-03 DIAGNOSIS — F1290 Cannabis use, unspecified, uncomplicated: Secondary | ICD-10-CM | POA: Diagnosis not present

## 2016-10-03 DIAGNOSIS — F17290 Nicotine dependence, other tobacco product, uncomplicated: Secondary | ICD-10-CM

## 2016-10-03 DIAGNOSIS — R110 Nausea: Secondary | ICD-10-CM | POA: Insufficient documentation

## 2016-10-03 DIAGNOSIS — J0110 Acute frontal sinusitis, unspecified: Secondary | ICD-10-CM | POA: Diagnosis not present

## 2016-10-03 DIAGNOSIS — R0981 Nasal congestion: Secondary | ICD-10-CM | POA: Diagnosis present

## 2016-10-04 ENCOUNTER — Encounter (HOSPITAL_COMMUNITY): Payer: Self-pay | Admitting: *Deleted

## 2016-10-04 ENCOUNTER — Emergency Department (HOSPITAL_COMMUNITY)
Admission: EM | Admit: 2016-10-04 | Discharge: 2016-10-04 | Disposition: A | Discharge status: Home / Self Care | Payer: Medicaid Other | Attending: Emergency Medicine | Admitting: Emergency Medicine

## 2016-10-04 ENCOUNTER — Emergency Department (HOSPITAL_COMMUNITY)
Admission: EM | Admit: 2016-10-04 | Discharge: 2016-10-04 | Disposition: A | Discharge status: Home / Self Care | Payer: Medicaid Other | Source: Home / Self Care

## 2016-10-04 DIAGNOSIS — F17290 Nicotine dependence, other tobacco product, uncomplicated: Secondary | ICD-10-CM | POA: Insufficient documentation

## 2016-10-04 DIAGNOSIS — Z79899 Other long term (current) drug therapy: Secondary | ICD-10-CM | POA: Insufficient documentation

## 2016-10-04 DIAGNOSIS — F1290 Cannabis use, unspecified, uncomplicated: Secondary | ICD-10-CM | POA: Insufficient documentation

## 2016-10-04 DIAGNOSIS — I10 Essential (primary) hypertension: Secondary | ICD-10-CM | POA: Insufficient documentation

## 2016-10-04 DIAGNOSIS — R350 Frequency of micturition: Secondary | ICD-10-CM | POA: Insufficient documentation

## 2016-10-04 DIAGNOSIS — J0110 Acute frontal sinusitis, unspecified: Secondary | ICD-10-CM | POA: Insufficient documentation

## 2016-10-04 DIAGNOSIS — R110 Nausea: Secondary | ICD-10-CM | POA: Insufficient documentation

## 2016-10-04 LAB — URINALYSIS, ROUTINE W REFLEX MICROSCOPIC
Bilirubin Urine: NEGATIVE
GLUCOSE, UA: NEGATIVE mg/dL
Hgb urine dipstick: NEGATIVE
Ketones, ur: NEGATIVE mg/dL
LEUKOCYTES UA: NEGATIVE
NITRITE: NEGATIVE
PROTEIN: NEGATIVE mg/dL
Specific Gravity, Urine: 1.014 (ref 1.005–1.030)
pH: 6.5 (ref 5.0–8.0)

## 2016-10-04 MED ORDER — AZITHROMYCIN 250 MG PO TABS: ORAL_TABLET | ORAL | 0 refills | Status: DC

## 2016-10-04 MED ORDER — TAMSULOSIN HCL 0.4 MG PO CAPS: 0.8000 mg | ORAL_CAPSULE | Freq: Every day | ORAL | 0 refills | Status: DC

## 2016-10-04 NOTE — ED Triage Notes (Signed)
Patient presents stating he knows he has a UTI, sinus infection and is nauseated.  NO new meds.  States his urine has an order to it

## 2016-10-04 NOTE — Discharge Instructions (Signed)
Zithromax as prescribed.  Over-the-counter medications as needed for symptom relief.  Follow-up with your primary Dr. if not improving in the next week.

## 2016-10-04 NOTE — ED Triage Notes (Signed)
Pt arrives to the ER via EMS for complaints of URI and congestion; [facility] and left and called [facility] the [facility]; pt c/o urinary frequency and odor to his urine; pt provided  a urine [facility]; pt also c/o congestion x several days; pt states that he has a hx of HTN and has not taken any BP meds since Aug due to not having a physician

## 2016-10-04 NOTE — ED Provider Notes (Signed)
[facility]-EMERGENCY DEPT Provider Note   CSN: [id number] Arrival date & time: 10/04/16  0305     History   Chief Complaint Chief Complaint  Patient presents with  . Urinary Tract Infection    HPI Zachary Russo is a 55 y.o. male.  Patient is a 55 year old male with past medical history of anxiety, depression, asperger's syndrome. He presents for evaluation of nausea and urinary frequency. He also reports sinus congestion that has been worsening over the past 3 weeks. He denies any fevers or chills.   He was at [facility] emergency department earlier this evening, then left due to a prolonged wait time. He then called the ambulance and was brought here for evaluation.   The history is provided by the patient.    Past Medical History:  Diagnosis Date  . Anxiety   . Asperger syndrome   . Depression   . H/O suicide attempt    overdose "many years ago"  . Homelessness   . Hypertension   . Prostate disorder   . Restless leg syndrome     Patient Active Problem List   Diagnosis Date Noted  . 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 Major depressive disorder, recurrent episode (HCC) 07/07/2015  . Anxiety disorder 01/27/2015  . Adjustment disorder with depressed mood 01/18/2015  . Acute encephalopathy 12/18/2013  . Delirium 12/18/2013  . 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 Abnormal brain CT 12/18/2013    History reviewed. No pertinent surgical history.     Home Medications    Prior to Admission medications   Medication Sig Start Date End Date Taking? Authorizing Provider  acetaminophen (TYLENOL) 500 MG tablet Take 1,000 mg by mouth every 6 (six) hours as needed for mild pain or headache.    Historical Provider, MD  amLODipine (NORVASC) 10 MG tablet Take 1 tablet (10 mg total) by mouth daily before supper. 06/03/16   Gerhard Munch, MD  ciprofloxacin (CIPRO) 500 MG tablet Take 1 tablet (500 mg total) by mouth 2 (two) times  daily. 06/03/16   Gerhard Munch, MD  cloNIDine (CATAPRES) 0.1 MG tablet Take 1 tablet (0.1 mg total) by mouth at bedtime. 06/03/16   Gerhard Munch, MD  diazepam (VALIUM) 5 MG tablet Take 1 tablet (5 mg total) by mouth daily as needed for muscle spasms. 06/03/16   Gerhard Munch, MD  FLUoxetine (PROZAC) 40 MG capsule Take 1 capsule (40 mg total) by mouth daily. 06/03/16   Gerhard Munch, MD  gabapentin (NEURONTIN) 400 MG capsule Take 1-2 capsules (400-800 mg total) by mouth 2 (two) times daily. 400 mg in AM and 800 mg in PM 06/03/16   Gerhard Munch, MD  ibuprofen (ADVIL,MOTRIN) 200 MG tablet Take 400 mg by mouth every 6 (six) hours as needed for fever or moderate pain.    Historical Provider, MD  lisinopril (PRINIVIL,ZESTRIL) 20 MG tablet Take 1 tablet (20 mg total) by mouth daily. 06/03/16   Gerhard Munch, MD  loratadine (CLARITIN) 10 MG tablet Take 1 tablet (10 mg total) by mouth daily. One po daily x 5 days Patient taking differently: Take 10 mg by mouth daily as needed for allergies.  01/16/16   Arby Barrette, MD  promethazine (PHENERGAN) 25 MG tablet Take 1 tablet (25 mg total) by mouth every 6 (six) hours as needed for nausea. 06/03/16   Gerhard Munch, MD  propranolol (INDERAL) 20 MG tablet Take 1 tablet (20 mg total) by mouth daily. 06/03/16   Gerhard Munch, MD  tamsulosin (FLOMAX) 0.4 MG CAPS capsule Take 2 capsules (0.8 mg total) by mouth daily. 06/03/16   Gerhard Munchobert Lockwood, MD    Family History No family history on file.  Social History Social History  Substance Use Topics  . Smoking status: Light Tobacco Smoker    Years: 2.00    Types: Cigars  . Smokeless tobacco: Never Used  . Alcohol use No     Allergies   Amoxicillin; Cephalexin; Lemon oil; Tape; and Codeine   Review of Systems Review of Systems  All other systems reviewed and are negative.    Physical Exam Updated Vital Signs BP (!) 158/120 (BP Location: Right Arm) Comment: pt sts he hasnt had his BP  meidcation since August.   Pulse 100   Temp 99.2 F (37.3 C) (Oral)   Resp 20   SpO2 96%   Physical Exam  Constitutional: He is oriented to person, place, and time. He appears well-developed and well-nourished. No distress.  HENT:  Head: Normocephalic and atraumatic.  Mouth/Throat: Oropharynx is clear and moist.  TMs are clear bilaterally. There is maxillofacial tenderness.  Neck: Normal range of motion. Neck supple.  Cardiovascular: Normal rate and regular rhythm.  Exam reveals no friction rub.   No murmur heard. Pulmonary/Chest: Effort normal and breath sounds normal. No respiratory distress. He has no wheezes. He has no rales.  Abdominal: Soft. Bowel sounds are normal. He exhibits no distension. There is no tenderness.  Musculoskeletal: Normal range of motion. He exhibits no edema.  Neurological: He is alert and oriented to person, place, and time. Coordination normal.  Skin: Skin is warm and dry. He is not diaphoretic.  Nursing note and vitals reviewed.    ED Treatments / Results  Labs (all labs ordered are listed, but only abnormal results are displayed) Labs Reviewed - No data to display  EKG  EKG Interpretation None       Radiology No results found.  Procedures Procedures (including critical care time)  Medications Ordered in ED Medications - No data to display   Initial Impression / Assessment and Plan / ED Course  I have reviewed the triage vital signs and the nursing notes.  Pertinent labs & imaging results that were available during my care of the patient were reviewed by me and considered in my medical decision making (see chart for details).  Clinical Course    Urinalysis is clear. We'll treat for a sinus infection with Zithromax. He is to follow-up as needed.  Final Clinical Impressions(s) / ED Diagnoses   Final diagnoses:  None    New Prescriptions New Prescriptions   No medications on file     Geoffery Lyonsouglas Meeghan Skipper, MD 10/04/16 [phone]

## 2016-10-26 ENCOUNTER — Emergency Department (HOSPITAL_COMMUNITY)
Admission: EM | Admit: 2016-10-26 | Discharge: 2016-10-27 | Disposition: A | Discharge status: Home / Self Care | Payer: Medicaid Other | Attending: Emergency Medicine | Admitting: Emergency Medicine

## 2016-10-26 ENCOUNTER — Encounter (HOSPITAL_COMMUNITY): Payer: Self-pay | Admitting: *Deleted

## 2016-10-26 DIAGNOSIS — R45851 Suicidal ideations: Secondary | ICD-10-CM

## 2016-10-26 DIAGNOSIS — F339 Major depressive disorder, recurrent, unspecified: Secondary | ICD-10-CM | POA: Diagnosis present

## 2016-10-26 DIAGNOSIS — F329 Major depressive disorder, single episode, unspecified: Secondary | ICD-10-CM | POA: Insufficient documentation

## 2016-10-26 DIAGNOSIS — F32A Depression, unspecified: Secondary | ICD-10-CM

## 2016-10-26 DIAGNOSIS — F17290 Nicotine dependence, other tobacco product, uncomplicated: Secondary | ICD-10-CM | POA: Insufficient documentation

## 2016-10-26 DIAGNOSIS — I10 Essential (primary) hypertension: Secondary | ICD-10-CM | POA: Insufficient documentation

## 2016-10-26 DIAGNOSIS — F322 Major depressive disorder, single episode, severe without psychotic features: Secondary | ICD-10-CM | POA: Insufficient documentation

## 2016-10-26 DIAGNOSIS — Z79899 Other long term (current) drug therapy: Secondary | ICD-10-CM | POA: Insufficient documentation

## 2016-10-26 DIAGNOSIS — F332 Major depressive disorder, recurrent severe without psychotic features: Secondary | ICD-10-CM

## 2016-10-26 LAB — COMPREHENSIVE METABOLIC PANEL
ALBUMIN: 4.5 g/dL (ref 3.5–5.0)
ALT: 33 U/L (ref 17–63)
AST: 24 U/L (ref 15–41)
Alkaline Phosphatase: 85 U/L (ref 38–126)
Anion gap: 7 (ref 5–15)
BILIRUBIN TOTAL: 0.7 mg/dL (ref 0.3–1.2)
BUN: 15 mg/dL (ref 6–20)
CO2: 23 mmol/L (ref 22–32)
Calcium: 9 mg/dL (ref 8.9–10.3)
Chloride: 109 mmol/L (ref 101–111)
Creatinine, Ser: 0.95 mg/dL (ref 0.61–1.24)
GFR calc Af Amer: >60 mL/min (ref >60)
GFR calc non Af Amer: >60 mL/min (ref >60)
GLUCOSE: 124 mg/dL — AB (ref 65–99)
POTASSIUM: 3.8 mmol/L (ref 3.5–5.1)
Sodium: 139 mmol/L (ref 135–145)
TOTAL PROTEIN: 7.7 g/dL (ref 6.5–8.1)

## 2016-10-26 LAB — CBC WITH DIFFERENTIAL/PLATELET
BASOS PCT: 0 %
Basophils Absolute: 0 10*3/uL (ref 0.0–0.1)
Eosinophils Absolute: 0.2 10*3/uL (ref 0.0–0.7)
Eosinophils Relative: 2 %
HEMATOCRIT: 46 % (ref 39.0–52.0)
Hemoglobin: 16 g/dL (ref 13.0–17.0)
LYMPHS PCT: 35 %
Lymphs Abs: 3.5 10*3/uL (ref 0.7–4.0)
MCH: 30.5 pg (ref 26.0–34.0)
MCHC: 34.8 g/dL (ref 30.0–36.0)
MCV: 87.6 fL (ref 78.0–100.0)
MONO ABS: 0.7 10*3/uL (ref 0.1–1.0)
MONOS PCT: 7 %
NEUTROS ABS: 5.5 10*3/uL (ref 1.7–7.7)
Neutrophils Relative %: 56 %
Platelets: 283 10*3/uL (ref 150–400)
RBC: 5.25 MIL/uL (ref 4.22–5.81)
RDW: 14.2 % (ref 11.5–15.5)
WBC: 9.8 10*3/uL (ref 4.0–10.5)

## 2016-10-26 LAB — RAPID URINE DRUG SCREEN, HOSP PERFORMED
Amphetamines: NOT DETECTED
BARBITURATES: NOT DETECTED
Benzodiazepines: NOT DETECTED
Cocaine: NOT DETECTED
Opiates: NOT DETECTED
Tetrahydrocannabinol: POSITIVE — AB

## 2016-10-26 LAB — ETHANOL: Alcohol, Ethyl (B): <5 mg/dL (ref <5)

## 2016-10-26 MED ORDER — LISINOPRIL 20 MG PO TABS
20.0000 mg | ORAL_TABLET | Freq: Every day | ORAL | Status: DC
  Administered 2016-10-27: 20 mg via ORAL
  Filled 2016-10-26: qty 1

## 2016-10-26 MED ORDER — AMLODIPINE BESYLATE 5 MG PO TABS
5.0000 mg | ORAL_TABLET | Freq: Every day | ORAL | Status: DC
  Administered 2016-10-27: 5 mg via ORAL
  Filled 2016-10-26: qty 1

## 2016-10-26 MED ORDER — CLONIDINE HCL 0.1 MG PO TABS
0.2000 mg | ORAL_TABLET | Freq: Every day | ORAL | Status: DC
  Administered 2016-10-27: 0.2 mg via ORAL
  Filled 2016-10-26: qty 2

## 2016-10-26 MED ORDER — QUETIAPINE FUMARATE 100 MG PO TABS
100.0000 mg | ORAL_TABLET | Freq: Every day | ORAL | Status: DC
  Administered 2016-10-26: 100 mg via ORAL
  Filled 2016-10-26: qty 1

## 2016-10-26 MED ORDER — GABAPENTIN 400 MG PO CAPS
400.0000 mg | ORAL_CAPSULE | Freq: Two times a day (BID) | ORAL | Status: DC
  Administered 2016-10-26 – 2016-10-27 (×2): 400 mg via ORAL
  Filled 2016-10-26 (×2): qty 1

## 2016-10-26 MED ORDER — TAMSULOSIN HCL 0.4 MG PO CAPS
0.8000 mg | ORAL_CAPSULE | Freq: Every day | ORAL | Status: DC
  Administered 2016-10-27: 0.8 mg via ORAL
  Filled 2016-10-26 (×2): qty 2

## 2016-10-26 MED ORDER — LORATADINE 10 MG PO TABS
10.0000 mg | ORAL_TABLET | Freq: Every day | ORAL | Status: DC
  Administered 2016-10-27: 10 mg via ORAL
  Filled 2016-10-26: qty 1

## 2016-10-26 MED ORDER — MONTELUKAST SODIUM 10 MG PO TABS
10.0000 mg | ORAL_TABLET | Freq: Every day | ORAL | Status: DC
  Administered 2016-10-27: 10 mg via ORAL
  Filled 2016-10-26: qty 1

## 2016-10-26 MED ORDER — FLUOXETINE HCL 20 MG PO CAPS
40.0000 mg | ORAL_CAPSULE | Freq: Every day | ORAL | Status: DC
  Administered 2016-10-27: 40 mg via ORAL
  Filled 2016-10-26: qty 2

## 2016-10-26 MED ORDER — PROPRANOLOL HCL 20 MG PO TABS
20.0000 mg | ORAL_TABLET | Freq: Every day | ORAL | Status: DC
  Administered 2016-10-27: 20 mg via ORAL
  Filled 2016-10-26: qty 1

## 2016-10-26 NOTE — ED Triage Notes (Signed)
Pt brought in by [employer].  Per [employer] pt made a statement that he was going to harm self.  Pt is now denying & stated "I'm not going to say anything about it."

## 2016-10-26 NOTE — ED Provider Notes (Signed)
[facility]-EMERGENCY DEPT Provider Note   CSN: [id number] Arrival date & time: 10/26/16  2106     History   Chief Complaint Chief Complaint  Patient presents with  . Suicidal    HPI Zachary Russo is a 55 y.o. male.  Patient is a 55 year old male with a long history of depression, aspirate first disease, anxiety who has been off medications for approximately 3 months because he lost his provider and has had trouble finding a new provider presenting today with worsening depression and suicidal ideation. Patient is hesitant to discuss his suicidal ideation but states that it would cause a large mess.  He denies access to a gun. He has been suicidal in the past and attempted to overdose which was approximately 9 years ago. He currently denies feeling suicidal at this moment but states his depression is getting worse and feels himself going down that road.  He denies any new stressors except for his current medical situation worries having difficulty finding providers has not been taking his medications.   The history is provided by the patient.    Past Medical History:  Diagnosis Date  . Anxiety   . Asperger syndrome   . Depression   . H/O suicide attempt    overdose "many years ago"  . Homelessness   . Hypertension   . Prostate disorder   . Restless leg syndrome     Patient Active Problem List   Diagnosis Date Noted  . 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 Major depressive disorder, recurrent episode (HCC) 07/07/2015  . Anxiety disorder 01/27/2015  . Adjustment disorder with depressed mood 01/18/2015  . Acute encephalopathy 12/18/2013  . Delirium 12/18/2013  . 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 Abnormal brain CT 12/18/2013    History reviewed. No pertinent surgical history.     Home Medications    Prior to Admission medications   Medication Sig Start Date End Date Taking? Authorizing Provider  amLODipine (NORVASC) 10  MG tablet Take 1 tablet (10 mg total) by mouth daily before supper. Patient taking differently: Take 5 mg by mouth daily before supper.  06/03/16  Yes Gerhard Munch, MD  amLODipine (NORVASC) 5 MG tablet Take 5 mg by mouth daily.   Yes Historical Provider, MD  cetirizine (ZYRTEC) 10 MG tablet Take 10 mg by mouth daily.   Yes Historical Provider, MD  cloNIDine (CATAPRES) 0.2 MG tablet Take 0.2 mg by mouth daily.   Yes Historical Provider, MD  FLUoxetine (PROZAC) 40 MG capsule Take 1 capsule (40 mg total) by mouth daily. 06/03/16  Yes Gerhard Munch, MD  gabapentin (NEURONTIN) 400 MG capsule Take 1-2 capsules (400-800 mg total) by mouth 2 (two) times daily. 400 mg in AM and 800 mg in PM 06/03/16  Yes Gerhard Munch, MD  lisinopril (PRINIVIL,ZESTRIL) 20 MG tablet Take 1 tablet (20 mg total) by mouth daily. 06/03/16  Yes Gerhard Munch, MD  montelukast (SINGULAIR) 10 MG tablet Take 10 mg by mouth at bedtime.   Yes Historical Provider, MD  propranolol (INDERAL) 20 MG tablet Take 1 tablet (20 mg total) by mouth daily. 06/03/16  Yes Gerhard Munch, MD  QUEtiapine (SEROQUEL) 100 MG tablet Take 100 mg by mouth at bedtime.   Yes Historical Provider, MD  tamsulosin (FLOMAX) 0.4 MG CAPS capsule Take 2 capsules (0.8 mg total) by mouth daily. 10/04/16  Yes Geoffery Lyons, MD  azithromycin (ZITHROMAX Z-PAK) 250 MG tablet 2 po day one, then 1 daily x 4 days  Patient not taking: Reported on 10/26/2016 10/04/16   Geoffery Lyonsouglas Delo, MD  ciprofloxacin (CIPRO) 500 MG tablet Take 1 tablet (500 mg total) by mouth 2 (two) times daily. Patient not taking: Reported on 10/26/2016 06/03/16   Gerhard Munchobert Lockwood, MD  cloNIDine (CATAPRES) 0.1 MG tablet Take 1 tablet (0.1 mg total) by mouth at bedtime. Patient taking differently: Take 0.2 mg by mouth at bedtime.  06/03/16   Gerhard Munchobert Lockwood, MD  diazepam (VALIUM) 5 MG tablet Take 1 tablet (5 mg total) by mouth daily as needed for muscle spasms. Patient not taking: Reported on 10/26/2016 06/03/16    Gerhard Munchobert Lockwood, MD  loratadine (CLARITIN) 10 MG tablet Take 1 tablet (10 mg total) by mouth daily. One po daily x 5 days Patient not taking: Reported on 10/26/2016 01/16/16   Arby BarretteMarcy Pfeiffer, MD  promethazine (PHENERGAN) 25 MG tablet Take 1 tablet (25 mg total) by mouth every 6 (six) hours as needed for nausea. Patient not taking: Reported on 10/26/2016 06/03/16   Gerhard Munchobert Lockwood, MD    Family History No family history on file.  Social History Social History  Substance Use Topics  . Smoking status: Light Tobacco Smoker    Years: 2.00    Types: Cigars  . Smokeless tobacco: Never Used  . Alcohol use No     Allergies   Amoxicillin; Cephalexin; Lemon oil; Tape; and Codeine   Review of Systems Review of Systems  All other systems reviewed and are negative.    Physical Exam Updated Vital Signs BP (!) 191/130 (BP Location: Left Arm)   Pulse 96   Temp 98.2 F (36.8 C) (Oral)   Resp 18   Ht 5\' 8"  (1.727 m)   Wt 165 lb (74.8 kg)   SpO2 95%   BMI 25.09 kg/m   Physical Exam  Constitutional: He is oriented to person, place, and time. He appears well-developed and well-nourished. No distress.  HENT:  Head: Normocephalic and atraumatic.  Mouth/Throat: Oropharynx is clear and moist.  Eyes: Conjunctivae and EOM are normal. Pupils are equal, round, and reactive to light.  Neck: Normal range of motion. Neck supple.  Cardiovascular: Normal rate, regular rhythm and intact distal pulses.   No murmur heard. Pulmonary/Chest: Effort normal and breath sounds normal. No respiratory distress. He has no wheezes. He has no rales.  Abdominal: Soft. He exhibits no distension. There is no tenderness. There is no rebound and no guarding.  Musculoskeletal: Normal range of motion. He exhibits no edema or tenderness.  Neurological: He is alert and oriented to person, place, and time.  Skin: Skin is warm and dry. No rash noted. No erythema.  Psychiatric: His behavior is normal. His affect is blunt.  Thought content is not paranoid. He exhibits a depressed mood. He expresses suicidal ideation. He expresses no homicidal ideation. He expresses suicidal plans.  Nursing note and vitals reviewed.    ED Treatments / Results  Labs (all labs ordered are listed, but only abnormal results are displayed) Labs Reviewed  COMPREHENSIVE METABOLIC PANEL - Abnormal; Notable for the following:       Result Value   Glucose, Bld 124 (*)    All other components within normal limits  RAPID URINE DRUG SCREEN, HOSP PERFORMED - Abnormal; Notable for the following:    Tetrahydrocannabinol POSITIVE (*)    All other components within normal limits  ETHANOL  CBC WITH DIFFERENTIAL/PLATELET    EKG  EKG Interpretation  Date/Time:  Wednesday October 26 2016 21:52:40 EST Ventricular Rate:  92  PR Interval:  190 QRS Duration: 86 QT Interval:  364 QTC Calculation: 450 R Axis:   -19 Text Interpretation:  Normal sinus rhythm Inferior infarct , age undetermined No significant change since last tracing Confirmed by [facility]  MD, Alphonzo LemmingsWHITNEY ([id number]) on 10/26/2016 10:16:51 PM       Radiology No results found.  Procedures Procedures (including critical care time)  Medications Ordered in ED Medications  amLODipine (NORVASC) tablet 5 mg (not administered)  cloNIDine (CATAPRES) tablet 0.2 mg (not administered)  loratadine (CLARITIN) tablet 10 mg (not administered)  FLUoxetine (PROZAC) capsule 40 mg (not administered)  gabapentin (NEURONTIN) capsule 400-800 mg (not administered)  lisinopril (PRINIVIL,ZESTRIL) tablet 20 mg (not administered)  montelukast (SINGULAIR) tablet 10 mg (not administered)  propranolol (INDERAL) tablet 20 mg (not administered)  QUEtiapine (SEROQUEL) tablet 100 mg (not administered)  tamsulosin (FLOMAX) capsule 0.8 mg (not administered)     Initial Impression / Assessment and Plan / ED Course  I have reviewed the triage vital signs and the nursing notes.  Pertinent labs & imaging  results that were available during my care of the patient were reviewed by me and considered in my medical decision making (see chart for details).  Clinical Course    Patient is a 55 year old gentleman presenting today with worsening depression and suicidal thoughts. He is currently been off meds for 3 months because he has lost his providers. Patient is feeling hopeless and sad. He does not have access to a gun. On exam he has a fairly flat affect but denies feeling acutely suicidal at this moment. Feels that he is just going down that road and will continue to get worse. Patient is hypertensive here with a blood pressure 191/130. He is supposed to be taking Norvasc, clonidine, lisinopril and propranolol which he has not taken in 3 months. This is most likely the cause of his hypertension today. He denies any chest pain or shortness of breath. EKG is within normal limits. Medical clearance labs pending and medications ordered.  Also feel patient needs TTS evaluation.  11:06 PM Labs without acute findings.  Final Clinical Impressions(s) / ED Diagnoses   Final diagnoses:  Suicidal ideation  Severe episode of recurrent major depressive disorder, without psychotic features [facility](HCC)    New Prescriptions New Prescriptions   No medications on file     Gwyneth SproutWhitney Judyth Demarais, MD 10/26/16 2307

## 2016-10-26 NOTE — ED Notes (Signed)
Dr. Anitra LauthPlunkett in with pt.

## 2016-10-26 NOTE — ED Notes (Signed)
Bed: [facility]27 Expected date:  Expected time:  Means of arrival:  Comments: Coming in for psych eval

## 2016-10-26 NOTE — ED Notes (Addendum)
Pt stated "I haven't had any of meds x 3 months.  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 Anitra LauthPlunkett of trying to harm self "probably back in 2008 when I took pills"  Pt stated "I don't like the doctor that comes around in the morning.  He's African-American and doesn't speak [place].  He says this is his emergency room."

## 2016-10-27 ENCOUNTER — Inpatient Hospital Stay (HOSPITAL_COMMUNITY)
Admission: AD | Admit: 2016-10-27 | Payer: Federal, State, Local not specified - Other | Source: Intra-hospital | Admitting: Psychiatry

## 2016-10-27 ENCOUNTER — Encounter (HOSPITAL_COMMUNITY): Payer: Self-pay

## 2016-10-27 ENCOUNTER — Inpatient Hospital Stay (HOSPITAL_COMMUNITY)
Admission: AD | Admit: 2016-10-27 | Discharge: 2016-11-01 | DRG: 885 | Disposition: A | Discharge status: Home / Self Care | Payer: Medicaid Other | Attending: Psychiatry | Admitting: Psychiatry

## 2016-10-27 DIAGNOSIS — F1290 Cannabis use, unspecified, uncomplicated: Secondary | ICD-10-CM

## 2016-10-27 DIAGNOSIS — F17210 Nicotine dependence, cigarettes, uncomplicated: Secondary | ICD-10-CM | POA: Diagnosis present

## 2016-10-27 DIAGNOSIS — F331 Major depressive disorder, recurrent, moderate: Secondary | ICD-10-CM | POA: Diagnosis not present

## 2016-10-27 DIAGNOSIS — F332 Major depressive disorder, recurrent severe without psychotic features: Secondary | ICD-10-CM | POA: Diagnosis present

## 2016-10-27 DIAGNOSIS — Z888 Allergy status to other drugs, medicaments and biological substances status: Secondary | ICD-10-CM | POA: Diagnosis not present

## 2016-10-27 DIAGNOSIS — Z79899 Other long term (current) drug therapy: Secondary | ICD-10-CM

## 2016-10-27 DIAGNOSIS — F329 Major depressive disorder, single episode, unspecified: Secondary | ICD-10-CM | POA: Diagnosis present

## 2016-10-27 DIAGNOSIS — F17290 Nicotine dependence, other tobacco product, uncomplicated: Secondary | ICD-10-CM | POA: Diagnosis not present

## 2016-10-27 DIAGNOSIS — F339 Major depressive disorder, recurrent, unspecified: Secondary | ICD-10-CM | POA: Diagnosis present

## 2016-10-27 MED ORDER — GABAPENTIN 400 MG PO CAPS
800.0000 mg | ORAL_CAPSULE | Freq: Every day | ORAL | Status: DC
  Administered 2016-10-27 – 2016-10-31 (×6): 800 mg via ORAL
  Filled 2016-10-27 (×8): qty 2

## 2016-10-27 MED ORDER — ALUM & MAG HYDROXIDE-SIMETH 200-200-20 MG/5ML PO SUSP: 30.0000 mL | ORAL | Status: DC | PRN

## 2016-10-27 MED ORDER — LORATADINE 10 MG PO TABS
10.0000 mg | ORAL_TABLET | ORAL | Status: DC
  Administered 2016-10-28 – 2016-11-01 (×5): 10 mg via ORAL
  Filled 2016-10-27 (×8): qty 1

## 2016-10-27 MED ORDER — TRAZODONE HCL 50 MG PO TABS
50.0000 mg | ORAL_TABLET | Freq: Every evening | ORAL | Status: DC | PRN
  Filled 2016-10-27: qty 1

## 2016-10-27 MED ORDER — TAMSULOSIN HCL 0.4 MG PO CAPS
0.8000 mg | ORAL_CAPSULE | Freq: Every day | ORAL | Status: DC
  Filled 2016-10-27 (×3): qty 2

## 2016-10-27 MED ORDER — LISINOPRIL 20 MG PO TABS
20.0000 mg | ORAL_TABLET | Freq: Every day | ORAL | Status: DC
  Administered 2016-10-28 – 2016-11-01 (×5): 20 mg via ORAL
  Filled 2016-10-27 (×6): qty 1
  Filled 2016-10-27: qty 7
  Filled 2016-10-27: qty 1

## 2016-10-27 MED ORDER — GABAPENTIN 400 MG PO CAPS
800.0000 mg | ORAL_CAPSULE | ORAL | Status: DC
  Administered 2016-10-28 – 2016-11-01 (×4): 800 mg via ORAL
  Filled 2016-10-27 (×6): qty 2
  Filled 2016-10-27: qty 28
  Filled 2016-10-27 (×2): qty 2

## 2016-10-27 MED ORDER — HYDROXYZINE HCL 25 MG PO TABS
25.0000 mg | ORAL_TABLET | Freq: Three times a day (TID) | ORAL | Status: DC | PRN
  Administered 2016-10-27 – 2016-10-30 (×4): 25 mg via ORAL
  Filled 2016-10-27 (×3): qty 1
  Filled 2016-10-27: qty 10
  Filled 2016-10-27: qty 1

## 2016-10-27 MED ORDER — ACETAMINOPHEN 325 MG PO TABS: 650.0000 mg | ORAL_TABLET | Freq: Four times a day (QID) | ORAL | Status: DC | PRN

## 2016-10-27 MED ORDER — TAMSULOSIN HCL 0.4 MG PO CAPS
0.8000 mg | ORAL_CAPSULE | Freq: Every day | ORAL | Status: DC
  Administered 2016-10-28 – 2016-11-01 (×5): 0.8 mg via ORAL
  Filled 2016-10-27 (×5): qty 2
  Filled 2016-10-27: qty 14
  Filled 2016-10-27 (×2): qty 2

## 2016-10-27 MED ORDER — CLONIDINE HCL 0.1 MG PO TABS
0.1000 mg | ORAL_TABLET | Freq: Two times a day (BID) | ORAL | Status: DC
  Administered 2016-10-28 – 2016-10-31 (×7): 0.1 mg via ORAL
  Filled 2016-10-27 (×12): qty 1

## 2016-10-27 MED ORDER — FLUOXETINE HCL 20 MG PO CAPS
40.0000 mg | ORAL_CAPSULE | Freq: Every day | ORAL | Status: DC
  Administered 2016-10-28 – 2016-11-01 (×5): 40 mg via ORAL
  Filled 2016-10-27 (×3): qty 2
  Filled 2016-10-27: qty 14
  Filled 2016-10-27 (×4): qty 2

## 2016-10-27 MED ORDER — QUETIAPINE FUMARATE 100 MG PO TABS
100.0000 mg | ORAL_TABLET | Freq: Every day | ORAL | Status: DC
  Administered 2016-10-27 – 2016-10-31 (×5): 100 mg via ORAL
  Filled 2016-10-27 (×7): qty 1
  Filled 2016-10-27: qty 7
  Filled 2016-10-27: qty 1

## 2016-10-27 MED ORDER — MAGNESIUM HYDROXIDE 400 MG/5ML PO SUSP: 30.0000 mL | Freq: Every day | ORAL | Status: DC | PRN

## 2016-10-27 NOTE — Tx Team (Signed)
Initial Treatment Plan 10/27/2016 3:55 PM Zachary Russo NGE:[id number]    PATIENT STRESSORS: Financial difficulties Health problems Medication change or noncompliance   PATIENT STRENGTHS: Active sense of humor Capable of independent living Communication skills General fund of knowledge Motivation for treatment/growth   PATIENT IDENTIFIED PROBLEMS: Depression  Suicidal ideation  "I need a new psychiatrist and psychologist"  "I would like to have a new medical doctor before I leave"               DISCHARGE CRITERIA:  Improved stabilization in mood, thinking, and/or behavior Verbal commitment to aftercare and medication compliance  PRELIMINARY DISCHARGE PLAN: Outpatient therapy Medication management  PATIENT/FAMILY INVOLVEMENT: This treatment plan has been presented to and reviewed with the patient, Zachary Russo.  The patient and family have been given the opportunity to ask questions and make suggestions.  Levin BaconHeather V Sylvio Weatherall, RN 10/27/2016, 3:55 PM
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
## 2016-10-27 NOTE — Progress Notes (Addendum)
D: Pt. is up and visible in the milieu, seen interacting with peers. Endorses passive SI but verbal contracts for safety. Denies having any HI/AVH/Pain at this time. Pt. presents with an anxious & depressed affect and mood. Pt. states " I feel much better now that I am here; before that, everything was at a downwards spiral". Pt. appears to be improving in mental status after attending karaoke this evening. Pt. Is receptive to treatment plan and was cooperative and pleasant with Clinical research associatewriter.   A: Encouragement and support given. Meds. ordered and given. PRN Vistaril requested and given. Will re-eval as necessary.   R: 1:1 interaction in private. Safety maintained with Q 15 checks. Continues to follow treatment plan and will monitor closely. No additional questions/concerns at this time.

## 2016-10-27 NOTE — ED Notes (Signed)
Alaina, the charge nurse went in to speak with patient and he began

## 2016-10-27 NOTE — H&P (Signed)
Behavioral Health Medical Screening Exam  Zachary Russo is an 55 y.o. male.  Total Time spent with patient: 15 minutes  Psychiatric Specialty Exam: Physical Exam  Constitutional: He is oriented to person, place, and time. He appears well-developed and well-nourished.  HENT:  Head: Normocephalic and atraumatic.  Eyes: Conjunctivae are normal. Pupils are equal, round, and reactive to light.  Neck: Normal range of motion.  Cardiovascular: Normal rate and normal heart sounds.   Respiratory: Effort normal and breath sounds normal.  GI: Soft. Bowel sounds are normal.  Musculoskeletal: Normal range of motion.  Neurological: He is alert and oriented to person, place, and time.    Review of Systems  All other systems reviewed and are negative.   BP 122/87 (BP Location: Right Arm)   Temp 98.1 F (36.7 C) (Oral)   Resp 18   SpO2 99%    General Appearance: Casual and Fairly Groomed  Eye Contact:  Good  Speech:  Clear and Coherent and Normal Rate  Volume:  Normal  Mood:  Depressed and Hopeless  Affect:  Congruent and Depressed  Thought Process:  Coherent, Goal Directed and Linear  Orientation:  Full (Time, Place, and Person)  Thought Content:  Logical  Suicidal Thoughts:  Yes.  with intent/plan  Homicidal Thoughts:  No  Memory:  Immediate;   Good Recent;   Good Remote;   Fair  Judgement:  Fair  Insight:  Fair  Psychomotor Activity:  Normal  Concentration: Concentration: Good and Attention Span: Good  Recall:  Good  Fund of Knowledge:Good  Language: Good  Akathisia:  No  Handed:  Right  AIMS (if indicated):     Assets:  Communication Skills Desire for Improvement Financial Resources/Insurance Housing Physical Health Resilience Social Support Transportation  Sleep:       Musculoskeletal: Strength & Muscle Tone: within normal limits Gait & Station: normal Patient leans: N/A BP 122/87 (BP Location: Right Arm)   Temp 98.1 F (36.7 C) (Oral)   Resp 18   SpO2 99%    Recommendations: Admit to inpatient, suicidal with a plan to jump off a bridge.    Zachary AbbeLaurie Britton Parks, NP 10/27/2016, 2:21 PM

## 2016-10-27 NOTE — ED Provider Notes (Signed)
Called and informed by nurse that patient was ready to be discharged. Her pH H. Patient's vital signs are normal. There was some concern over blood pressure medicines he was given his blood pressure medicines this morning his blood pressure is 142/99. He is asymptomatic and can be discharged home to follow up with his primary care physician for medical blood pressure management as an outpatient. This according to ACEP guidelines on hypertension in the emergency room.   Ryleigh Buenger Randall AnLyn Andriea Hasegawa, MD 10/27/16 1213

## 2016-10-27 NOTE — ED Notes (Signed)
After seeing psychiatrist patient became very upset.  He said he had requested to be taken to Zachary Russo but was brought to Zachary Russo.  He said he was not getting the help he needed and felt he should be admitted to the Zachary Russo for thoughts of suicide.  Patient asked to speak to the office of patient experience.  Charge nurse notified.

## 2016-10-27 NOTE — [facility] Suicide Risk Assessment (Signed)
Suicide Risk Assessment  Discharge Assessment   [facility]   Principal Problem: Major depressive disorder, recurrent episode [facility](HCC) Discharge Diagnoses:  Patient Active Problem List   Diagnosis Date Noted  . MDD (major depressive disorder), recurrent severe, without psychosis (HCC) [F33.2] 07/19/2015  . Major depressive disorder, recurrent episode (HCC) [F33.9] 07/07/2015  . Anxiety disorder [F41.9] 01/27/2015  . Adjustment disorder with depressed mood [F43.21] 01/18/2015  . Acute encephalopathy [G93.40] 12/18/2013  . Delirium [R41.0] 12/18/2013  . 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 Abnormal brain CT [R90.89] 12/18/2013    Total Time spent with patient: 15 minutes  Musculoskeletal: Strength & Muscle Tone: within normal limits Gait & Station: normal Patient leans: N/A  Psychiatric Specialty Exam:   Blood pressure (!) 155/111, pulse 90, temperature 97.8 F (36.6 C), temperature source Oral, resp. rate 20, height 5\' 8"  (1.727 m), weight 74.8 kg (165 lb), SpO2 95 %.Body mass index is 25.09 kg/m.   General Appearance: Fairly Groomed  Eye Contact:  Good  Speech:  Clear and Coherent and Normal Rate  Volume:  Normal  Mood:  Anxious  Affect:  Congruent  Thought Process:  Coherent  Orientation:  Full (Time, Place, and Person)  Thought Content:  Logical  Suicidal Thoughts:  No  Homicidal Thoughts:  No  Memory:  Immediate;   Good Recent;   Good  Judgement:  Fair  Insight:  Fair  Psychomotor Activity:  Normal  Concentration:  Concentration: Good and Attention Span: Good  Recall:  Fair  Fund of Knowledge:  Good  Language:  Good  Akathisia:  No  Handed:  Right  AIMS (if indicated):     Assets:  Communication Skills Desire for Improvement Housing Physical Health Resilience  ADL's:  Intact  Cognition:  WNL  Sleep:      Mental Status Per Nursing Assessment::   On Admission:     Demographic Factors:  Male, Caucasian and Living  alone  Loss Factors: NA  Historical Factors: NA  Risk Reduction Factors:   Positive social support  Continued Clinical Symptoms:  Depression, moderate  Cognitive Features That Contribute To Risk:  None    Suicide Risk:  Minimal: No identifiable suicidal ideation.  Patients presenting with no risk factors but with morbid ruminations; may be classified as minimal risk based on the severity of the depressive symptoms     Plan Of Care/Follow-up recommendations:  Activity:  as tolerated Diet:  heart healthy   Alberteen SamFran Allene Furuya, FNP-BC [facility] 10/27/2016, 11:22 AM

## 2016-10-27 NOTE — BH Assessment (Addendum)
Tele Assessment Note   Zachary ButterKevin Russo is an 55 y.o. male.  -Clinician reviewed note by Dr. Gwyneth SproutWhitney Plunkett.  Patient is a 55 year old male with a long history of depression, Aspergers, anxiety who has been off medications for approximately 3 months because he lost his provider and has had trouble finding a new provider presenting today with worsening depression and suicidal ideation. Patient is hesitant to discuss his suicidal ideation but states that it would cause a large mess.  He denies access to a gun. He has been suicidal in the past and attempted to overdose which was approximately 9 years ago. He currently denies feeling suicidal at this moment but states his depression is getting worse and feels himself going down that road.  Patient says that he has been getting increasingly suicidal.  He has a plan that he won't divulge.  He says he has access to that plan.  Patient has had one previous attempt to kill self.  Patient had contacted [facility].  Patient was brought to hospital by [employer].  Patient lives by himself.  Patient says that he has had two friends die in the last 6 months.  His mother died in September of 2014.  Patient says that being jobless is stressful.  He complains that he does not have a doctor or psychiatrist and has difficulty finding any that will take MCD.  Patient has no HI or A/V hallucinations.  He does smoke marijuana less than once per week.  Last use was over two weeks ago he says.  [facility] but says he is not going back, last appt was 3 weeks ago.  [facility].  -Clinician discussed patient care with Donell SievertSpencer Simon, PA.  He recommends inpatient psychiatric care.  Patient's blood pressure is high however and will need to be monitored.  SBP needs to be less than 180, DBP needs to be less than 110 before patient can [facility].  Diagnosis: MDD,  recurrent severe; Cannabis use d/o moderate  Past Medical History:  Past Medical History:  Diagnosis Date  . Anxiety   . Asperger syndrome   . Depression   . H/O suicide attempt    overdose "many years ago"  . Homelessness   . Hypertension   . Prostate disorder   . Restless leg syndrome     History reviewed. No pertinent surgical history.  Family History: No family history on file.  Social History:  reports that he has been smoking Cigars.  He has smoked for the past 2.00 years. He has never used smokeless tobacco. He reports that he uses drugs, including Marijuana. He reports that he does not drink alcohol.  Additional Social History:  Alcohol / Drug Use Pain Medications: None Prescriptions: None in last three months. Over the Counter: Tylenol as needed History of alcohol / drug use?: Yes Substance #1 Name of Substance 1: Marijuana 1 - Age of First Use: early teens 1 - Amount (size/oz): a blunt 1 - Frequency: Less than once per week. 1 - Duration: on going 1 - Last Use / Amount: Two weeks ago  CIWA: CIWA-Ar BP: (!) 191/130 Pulse Rate: 96 COWS:    PATIENT STRENGTHS: (choose at least two) Ability for insight Average or above average intelligence Capable of independent living Communication skills Motivation for treatment/growth  Allergies:  Allergies  Allergen Reactions  . Amoxicillin Other (See Comments)    Ineffective (patient states  his is resistant)  . Cephalexin Nausea And Vomiting  . Lemon Oil Itching  . Tape Other (See Comments)    Please use "paper" tape only.  Irritation, redness  . Codeine Itching    Home Medications:  (Not in a hospital admission)  OB/GYN Status:  No LMP for male patient.  General Assessment Data Location of Assessment: [facility] TTS Assessment: In system Is this a Tele or Face-to-Face Assessment?: Face-to-Face Is this an Initial Assessment or a Re-assessment for this encounter?: Initial Assessment Marital status: Single Is  patient pregnant?: No Pregnancy Status: No Living Arrangements: Alone Can pt return to current living arrangement?: Yes Admission Status: Voluntary Is patient capable of signing voluntary admission?: Yes Referral Source: Self/Family/Friend [facility](MCM did an assessment tonight.) Insurance type: MCD     Crisis Care Plan Living Arrangements: Alone Name of Psychiatrist: None Name of Therapist: None  Education Status Is patient currently in school?: No Highest grade of school patient has completed: Some college  Risk to self with the past 6 months Suicidal Ideation: Yes-Currently Present Has patient been a risk to self within the past 6 months prior to admission? : Yes Suicidal Intent: Yes-Currently Present Has patient had any suicidal intent within the past 6 months prior to admission? : No Is patient at risk for suicide?: Yes Suicidal Plan?: Yes-Currently Present Has patient had any suicidal plan within the past 6 months prior to admission? : Yes Specify Current Suicidal Plan: "I don't want to say." Access to Means: Yes Specify Access to Suicidal Means: Pt won't divulge plan. What has been your use of drugs/alcohol within the last 12 months?: THC Previous Attempts/Gestures: Yes How many times?: 1 Other Self Harm Risks: None Triggers for Past Attempts: Family contact Intentional Self Injurious Behavior: None Family Suicide History: No Recent stressful life event(s): Financial Problems, Job Loss Persecutory voices/beliefs?: Yes Depression: Yes Depression Symptoms: Despondent, Insomnia, Tearfulness, Isolating, Guilt, Loss of interest in usual pleasures, Feeling worthless/self pity Substance abuse history and/or treatment for substance abuse?: Yes Suicide prevention information given to non-admitted patients: Not applicable  Risk to Others within the past 6 months Homicidal Ideation: No Does patient have any lifetime risk of violence toward others beyond the six months prior to  admission? : No Thoughts of Harm to Others: No Current Homicidal Intent: No Current Homicidal Plan: No Access to Homicidal Means: No Identified Victim: No one History of harm to others?: No Assessment of Violence: None Noted Violent Behavior Description: No one Does patient have access to weapons?: No Criminal Charges Pending?: No Does patient have a court date: No Is patient on probation?: No  Psychosis Hallucinations: None noted Delusions: None noted  Mental Status Report Appearance/Hygiene: Poor hygiene, In scrubs Eye Contact: Poor Motor Activity: Freedom of movement, Unremarkable Speech: Logical/coherent Level of Consciousness: Alert Mood: Depressed, Anxious, Despair, Helpless Affect: Anxious, Sad Anxiety Level: Moderate Thought Processes: Coherent, Relevant Judgement: Unimpaired Orientation: Person, Place, Situation, Time Obsessive Compulsive Thoughts/Behaviors: Minimal  Cognitive Functioning Concentration: Normal Memory: Remote Intact, Recent Impaired IQ: Average Insight: Good Impulse Control: Fair Appetite: Fair Weight Loss: 0 Weight Gain: 0 Sleep: Decreased Total Hours of Sleep:  (<6H/D) Vegetative Symptoms: Staying in bed  ADLScreening [facility] Assessment Services) Patient's cognitive ability adequate to safely complete daily activities?: Yes Patient able to express need for assistance with ADLs?: Yes Independently performs ADLs?: Yes (appropriate for developmental age)  Prior Inpatient Therapy Prior Inpatient Therapy: Yes Prior Therapy Dates: July '16 [facility] Reason for Treatment: depression  Prior Outpatient Therapy Prior Outpatient Therapy: Yes Prior Therapy Dates: Current Prior Therapy Facilty/Provider(s): Elissa Lovett at [facility] Reason for Treatment: DBT therapist Does patient have an ACCT team?: No Does patient have Intensive In-House Services?  : No Does patient have [facility] services? : No Does  patient have P4CC services?: No  ADL Screening (condition at time of admission) Patient's cognitive ability adequate to safely complete daily activities?: Yes Is the patient deaf or have difficulty hearing?: No Does the patient have difficulty seeing, even when wearing glasses/contacts?: Yes (Has bifocals.) Does the patient have difficulty concentrating, remembering, or making decisions?: Yes Patient able to express need for assistance with ADLs?: Yes Does the patient have difficulty dressing or bathing?: No Independently performs ADLs?: Yes (appropriate for developmental age) Does the patient have difficulty walking or climbing stairs?: No Weakness of Legs: None Weakness of Arms/Hands: None       Abuse/Neglect Assessment (Assessment to be complete while patient is alone) Physical Abuse: Denies Verbal Abuse: Yes, past (Comment) (Pt says "Absolutely.") Sexual Abuse: Denies Exploitation of patient/patient's resources: Denies Self-Neglect: Denies     Merchant navy officer (For Healthcare) Does patient have an advance directive?: No Would patient like information on creating an advanced directive?: No - patient declined information    Additional Information 1:1 In Past 12 Months?: No CIRT Risk: No Elopement Risk: No Does patient have medical clearance?: Yes     Disposition:  Disposition Initial Assessment Completed for this Encounter: Yes Disposition of Patient: Other dispositions Other disposition(s): Other (Comment) (Pt to be reviewed by PA)  Beatriz Stallion Ray 10/27/2016 1:28 AM

## 2016-10-27 NOTE — [facility] Suicide Risk Assessment (Signed)
[facility] Ad[facility]   Nursing information obtained from:    Demographic factors:    Current Mental Status:    Loss Factors:    Historical Factors:    Risk Reduction Factors:     Total Time spent with patient: 30 minutes Principal Problem: Major depressive disorder, recurrent episode (HCC) Diagnosis:   Patient Active Problem List   Diagnosis Date Noted  . MDD (major depressive disorder), recurrent severe, without psychosis (HCC) [F33.2] 07/19/2015  . Major depressive disorder, recurrent episode (HCC) [F33.9] 07/07/2015  . Anxiety disorder [F41.9] 01/27/2015  . Adjustment disorder with depressed mood [F43.21] 01/18/2015  . Acute encephalopathy [G93.40] 12/18/2013  . Delirium [R41.0] 12/18/2013  . 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 Abnormal brain CT [R90.89] 12/18/2013   Subjective Data: Patient denies any current intent or plan to harm himself or others.  Continued Clinical Symptoms:  Alcohol Use Disorder Identification Test Final Score (AUDIT): 0 The "Alcohol Use Disorders Identification Test", Guidelines for Use in Primary Care, Second Edition.  World Science writerHealth Organization [facility](WHO). Score between 0-7:  no or low risk or alcohol related problems. Score between 8-15:  moderate risk of alcohol related problems. Score between 16-19:  high risk of alcohol related problems. Score 20 or above:  warrants further diagnostic evaluation for alcohol dependence and treatment.   CLINICAL FACTORS:   Dysthymia   Musculoskeletal: Strength & Muscle Tone: within normal limits Gait & Station: normal Patient leans: N/A  Psychiatric Specialty Exam: Physical Exam  ROS  Blood pressure (!) 122/98, pulse 86, temperature 98.2 F (36.8 C), temperature source Oral, resp. rate 16, height 5\' 8"  (1.727 m), weight 78.5 kg (173 lb), SpO2 99 %.Body mass index is 26.3 kg/m.   General Appearance: Casual  Eye Contact:  Good  Speech:  Normal Rate  Volume:  Normal  Mood:  Anxious   Affect:  Appropriate  Thought Process:  Coherent  Orientation:  Full (Time, Place, and Person)  Thought Content:  Negative  Suicidal Thoughts:  No  Homicidal Thoughts:  No  Memory:  Negative  Judgement:  Fair  Insight:  Fair  Psychomotor Activity:  Normal  Concentration:  Concentration: Fair  Recall:  Good  Fund of Knowledge:  Good  Language:  Good  Akathisia:  No  Handed:  Right  AIMS (if indicated):     Assets:  Resilience  ADL's:  Intact  Cognition:  WNL  Sleep:        COGNITIVE FEATURES THAT CONTRIBUTE TO RISK:  None    SUICIDE RISK:   Mild:  Suicidal ideation of limited frequency, intensity, duration, and specificity.  There are no identifiable plans, no associated intent, mild dysphoria and related symptoms, good self-control (both objective and subjective assessment), few other risk factors, and identifiable protective factors, including available and accessible social support.   PLAN OF CARE: see PAA  I certify that inpatient services furnished can reasonably be expected to improve the patient's condition.  Acquanetta SitElizabeth Woods Oates, MD 10/27/2016, 3:44 PM

## 2016-10-27 NOTE — Progress Notes (Signed)
Pt attended karaoke group this evening.  

## 2016-10-27 NOTE — Progress Notes (Signed)
Zachary Russo is a 55 year old male being admitted voluntarily to 300[facility].  He was cleared by the ED and then [facility].  He reported suicidal thoughts and ongoing depression and anxiety.  He reported that he hasn't been taking his medications in about 3 months.  He doesn't have a current OP provider and is hoping to find a new provider upon discharge.  He reported that he has a history of HTN, Asperger's syndrome, pre diabetes and BPH.  He is not currently being treated for those as well.  He was pleasant and cooperative.  He denies pain or discomfort and appears to be in no physical distress.  He currently denies suicidal ideation but does agree to contract for safety on the unit.  Oriented him to the unit.  Admission paperwork reviewed and signed.  Belongings searched and secured in locker 41.  Skin searched and noted area of eczema on right shoulder.  We will monitor the progress towards his goals.

## 2016-10-27 NOTE — Consult Note (Signed)
[facility] Consult   Reason for Consult:  Psychiatric evaluation Referring Physician:  EDP Patient Identification: Zachary Russo MRN:  [id number] Principal Diagnosis: Major depressive disorder, recurrent episode [facility]) Diagnosis:   Patient Active Problem List   Diagnosis Date Noted  . MDD (major depressive disorder), recurrent severe, without psychosis ([place]) [F33.2] 07/19/2015  . Major depressive disorder, recurrent episode ([place]) [F33.9] 07/07/2015  . Anxiety disorder [F41.9] 01/27/2015  . Adjustment disorder with depressed mood [F43.21] 01/18/2015  . Acute encephalopathy [G93.40] 12/18/2013  . Delirium [R41.0] 12/18/2013  . 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 Abnormal brain CT [R90.89] 12/18/2013    Total Time spent with patient: 45 minutes  Subjective:   Zachary Russo is a 55 y.o. male patient who states "I haven't had meds in 3 months."  HPI:  Per behavioral health therapeutic triage assessment, -Clinician reviewed note by Dr. Blanchie Dessert.  Patient is a 55 year old male with a long history of depression, Aspergers, anxiety who has been off medications for approximately 3 months because he lost his provider and has had trouble finding a new provider presenting today with worsening depression and suicidal ideation. Patient is hesitant to discuss his suicidal ideation but states that it would cause a large mess. He denies access to a gun. He has been suicidal in the past and attempted to overdose which was approximately 9 years ago. He currently denies feeling suicidal at this moment but states his depression is getting worse and feels himself going down that road.  Patient says that he has been getting increasingly suicidal.  He has a plan that he won't divulge.  He says he has access to that plan.  Patient has had one previous attempt to kill self.  Patient had contacted [facility].  Patient was brought to hospital by [employer].   Patient lives by himself.  Patient says that he has had two friends die in the last 6 months.  His mother died in 13-Oct-2013.  Patient says that being jobless is stressful.  He complains that he does not have a doctor or psychiatrist and has difficulty finding any that will take MCD.  Patient has no HI or A/V hallucinations.  He does smoke marijuana less than once per week.  Last use was over two weeks ago he says.  Patient has counseling through [facility] counseling but says he is not going back, last appt was 3 weeks ago.  Patient was at [facility] in July of 2016.  Evaluation on the unit: Chart and nursing notes reviewed. Face-to-face evaluation completed with Dr. Darleene Cleaver. The patient is alert, oriented x 4, calm and cooperative. The patient states he has a past psychiatric history of asperger's, depression and anxiety. He states he was last seen at [place] [facility] but no longer receives care there. He states he has been out of his medical and psychotropic medication for 3 months due to not having a provider. He states he needs new providers for his medical and psychiatric issues as well as a therapist. He states the last therapist he saw "made me more depressed" because of what she was telling me to do. 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 Darleene Cleaver, stating "you are the reason I didn't want to come here. You are incompetent."  Today, the patient denies suicidal or homicidal ideation, intent or plan.   Past Psychiatric History: Aspergers, Depression, Anxiety  Risk to Self: Suicidal Ideation: no Suicidal Intent: no Is patient at risk for suicide?: no Suicidal Plan?: no Specify Current Suicidal Plan: "I don't want to say." Access to Means: no Specify Access to Suicidal Means: Pt won't divulge  plan. What has been your use of drugs/alcohol within the last 12 months?: THC How many times?: 1 Other Self Harm Risks: None Triggers for Past Attempts: Family contact Intentional Self Injurious Behavior: None Risk to Others: Homicidal Ideation: No Thoughts of Harm to Others: No Current Homicidal Intent: No Current Homicidal Plan: No Access to Homicidal Means: No Identified Victim: No one History of harm to others?: No Assessment of Violence: None Noted Violent Behavior Description: No one Does patient have access to weapons?: No Criminal Charges Pending?: No Does patient have a court date: No Prior Inpatient Therapy: Prior Inpatient Therapy: Yes Prior Therapy Dates: July '16 Prior Therapy Facilty/Provider(s): [facility] Reason for Treatment: depression Prior Outpatient Therapy: Prior Outpatient Therapy: Yes Prior Therapy Dates: Current Prior Therapy Facilty/Provider(s): [facility] [place] Reason for Treatment: DBT therapist Does patient have an ACCT team?: No Does patient have Intensive In-House Services?  : No Does patient have [facility] services? : No Does patient have P4CC services?: No  Past Medical History:  Past Medical History:  Diagnosis Date  . Anxiety   . Asperger syndrome   . Depression   . H/O suicide attempt    overdose "many years ago"  . Homelessness   . Hypertension   . Prostate disorder   . Restless leg syndrome    History reviewed. No pertinent surgical history. Family History: No family history on file. Family Psychiatric  History: unknown Social History:  History  Alcohol Use No     History  Drug Use  . Types: Marijuana    Comment: Pt reports last use was 2-3 weeks ago    Social History   Social History  . Marital status: Single    Spouse name: N/A  . Number of children: N/A  . Years of education: N/A   Social History Main Topics  . Smoking status: Light Tobacco Smoker    Years: 2.00    Types: Cigars  . Smokeless  tobacco: Never Used  . Alcohol use No  . Drug use:     Types: Marijuana     Comment: Pt reports last use was 2-3 weeks ago  . Sexual activity: Not Currently   Other Topics Concern  . None   Social History Narrative  . None   Additional Social History:    Allergies:   Allergies  Allergen Reactions  . Amoxicillin Other (See Comments)    Ineffective (patient states his is resistant)  . Cephalexin Nausea And Vomiting  . Lemon Oil Itching  . Tape Other (See Comments)    Please use "paper" tape only.  Irritation, redness  . Codeine Itching    Labs:  Results for orders placed or performed during the hospital encounter of 10/26/16 (from the past 48 hour(s))  Comprehensive metabolic panel     Status: Abnormal   Collection Time: 10/26/16  9:59 PM  Result Value Ref Range   Sodium 139 135 - 145 mmol/L   Potassium 3.8 3.5 - 5.1 mmol/L   Chloride 109 101 - 111 mmol/L   CO2 23 22 - 32 mmol/L   Glucose, Bld 124 (H) 65 - 99 mg/dL  BUN 15 6 - 20 mg/dL   Creatinine, Ser 0.95 0.61 - 1.24 mg/dL   Calcium 9.0 8.9 - 10.3 mg/dL   Total Protein 7.7 6.5 - 8.1 g/dL   Albumin 4.5 3.5 - 5.0 g/dL   AST 24 15 - 41 U/L   ALT 33 17 - 63 U/L   Alkaline Phosphatase 85 38 - 126 U/L   Total Bilirubin 0.7 0.3 - 1.2 mg/dL   GFR calc non Af Amer >60 >60 mL/min   GFR calc Af Amer >60 >60 mL/min    Comment: (NOTE) The eGFR has been calculated using the CKD EPI equation. This calculation has not been validated in all clinical situations. eGFR's persistently <60 mL/min signify possible Chronic Kidney Disease.    Anion gap 7 5 - 15  Ethanol     Status: None   Collection Time: 10/26/16  9:59 PM  Result Value Ref Range   Alcohol, Ethyl (B) <5 <5 mg/dL    Comment:        LOWEST DETECTABLE LIMIT FOR SERUM ALCOHOL IS 5 mg/dL FOR MEDICAL PURPOSES ONLY   CBC with Diff     Status: None   Collection Time: 10/26/16  9:59 PM  Result Value Ref Range   WBC 9.8 4.0 - 10.5 K/uL   RBC 5.25 4.22 - 5.81  MIL/uL   Hemoglobin 16.0 13.0 - 17.0 g/dL   HCT 46.0 39.0 - 52.0 %   MCV 87.6 78.0 - 100.0 fL   MCH 30.5 26.0 - 34.0 pg   MCHC 34.8 30.0 - 36.0 g/dL   RDW 14.2 11.5 - 15.5 %   Platelets 283 150 - 400 K/uL   Neutrophils Relative % 56 %   Neutro Abs 5.5 1.7 - 7.7 K/uL   Lymphocytes Relative 35 %   Lymphs Abs 3.5 0.7 - 4.0 K/uL   Monocytes Relative 7 %   Monocytes Absolute 0.7 0.1 - 1.0 K/uL   Eosinophils Relative 2 %   Eosinophils Absolute 0.2 0.0 - 0.7 K/uL   Basophils Relative 0 %   Basophils Absolute 0.0 0.0 - 0.1 K/uL  Urine rapid drug screen (hosp performed)not at [facility]     Status: Abnormal   Collection Time: 10/26/16 10:05 PM  Result Value Ref Range   Opiates NONE DETECTED NONE DETECTED   Cocaine NONE DETECTED NONE DETECTED   Benzodiazepines NONE DETECTED NONE DETECTED   Amphetamines NONE DETECTED NONE DETECTED   Tetrahydrocannabinol POSITIVE (A) NONE DETECTED   Barbiturates NONE DETECTED NONE DETECTED    Comment:        DRUG SCREEN FOR MEDICAL PURPOSES ONLY.  IF CONFIRMATION IS NEEDED FOR ANY PURPOSE, NOTIFY LAB WITHIN 5 DAYS.        LOWEST DETECTABLE LIMITS FOR URINE DRUG SCREEN Drug Class       Cutoff (ng/mL) Amphetamine      1000 Barbiturate      200 Benzodiazepine   947 Tricyclics       654 Opiates          300 Cocaine          300 THC              50     Current Facility-Administered Medications  Medication Dose Route Frequency Provider Last Rate Last Dose  . amLODipine (NORVASC) tablet 5 mg  5 mg Oral Daily Blanchie Dessert, MD   5 mg at 10/27/16 1014  . cloNIDine (CATAPRES) tablet 0.2 mg  0.2 mg Oral Daily Whitney  Plunkett, MD   0.2 mg at 10/27/16 1012  . FLUoxetine (PROZAC) capsule 40 mg  40 mg Oral Daily Blanchie Dessert, MD   40 mg at 10/27/16 1012  . gabapentin (NEURONTIN) capsule 400-800 mg  400-800 mg Oral BID Blanchie Dessert, MD   400 mg at 10/27/16 1014  . lisinopril (PRINIVIL,ZESTRIL) tablet 20 mg  20 mg Oral Daily Blanchie Dessert, MD   20 mg  at 10/27/16 1015  . loratadine (CLARITIN) tablet 10 mg  10 mg Oral Daily Blanchie Dessert, MD   10 mg at 10/27/16 1014  . montelukast (SINGULAIR) tablet 10 mg  10 mg Oral QHS Blanchie Dessert, MD   10 mg at 10/27/16 0048  . propranolol (INDERAL) tablet 20 mg  20 mg Oral Daily Blanchie Dessert, MD   20 mg at 10/27/16 1014  . QUEtiapine (SEROQUEL) tablet 100 mg  100 mg Oral QHS Blanchie Dessert, MD   100 mg at 10/26/16 2233  . tamsulosin (FLOMAX) capsule 0.8 mg  0.8 mg Oral Daily Blanchie Dessert, MD   0.8 mg at 10/27/16 1056   Current Outpatient Prescriptions  Medication Sig Dispense Refill  . amLODipine (NORVASC) 10 MG tablet Take 1 tablet (10 mg total) by mouth daily before supper. (Patient taking differently: Take 5 mg by mouth daily before supper. ) 30 tablet 0  . amLODipine (NORVASC) 5 MG tablet Take 5 mg by mouth daily.    . cetirizine (ZYRTEC) 10 MG tablet Take 10 mg by mouth daily.    . cloNIDine (CATAPRES) 0.2 MG tablet Take 0.2 mg by mouth daily.    Marland Kitchen FLUoxetine (PROZAC) 40 MG capsule Take 1 capsule (40 mg total) by mouth daily. 30 capsule 0  . gabapentin (NEURONTIN) 400 MG capsule Take 1-2 capsules (400-800 mg total) by mouth 2 (two) times daily. 400 mg in AM and 800 mg in PM 60 capsule 0  . lisinopril (PRINIVIL,ZESTRIL) 20 MG tablet Take 1 tablet (20 mg total) by mouth daily. 30 tablet 0  . montelukast (SINGULAIR) 10 MG tablet Take 10 mg by mouth at bedtime.    . propranolol (INDERAL) 20 MG tablet Take 1 tablet (20 mg total) by mouth daily. 30 tablet 0  . QUEtiapine (SEROQUEL) 100 MG tablet Take 100 mg by mouth at bedtime.    . tamsulosin (FLOMAX) 0.4 MG CAPS capsule Take 2 capsules (0.8 mg total) by mouth daily. 60 capsule 0  . azithromycin (ZITHROMAX Z-PAK) 250 MG tablet 2 po day one, then 1 daily x 4 days (Patient not taking: Reported on 10/26/2016) 6 tablet 0  . ciprofloxacin (CIPRO) 500 MG tablet Take 1 tablet (500 mg total) by mouth 2 (two) times daily. (Patient not taking:  Reported on 10/26/2016) 14 tablet 0  . cloNIDine (CATAPRES) 0.1 MG tablet Take 1 tablet (0.1 mg total) by mouth at bedtime. (Patient taking differently: Take 0.2 mg by mouth at bedtime. ) 30 tablet 0  . diazepam (VALIUM) 5 MG tablet Take 1 tablet (5 mg total) by mouth daily as needed for muscle spasms. (Patient not taking: Reported on 10/26/2016) 10 tablet 0  . loratadine (CLARITIN) 10 MG tablet Take 1 tablet (10 mg total) by mouth daily. One po daily x 5 days (Patient not taking: Reported on 10/26/2016) 14 tablet 0  . promethazine (PHENERGAN) 25 MG tablet Take 1 tablet (25 mg total) by mouth every 6 (six) hours as needed for nausea. (Patient not taking: Reported on 10/26/2016) 20 tablet 0    Musculoskeletal: Strength &  Muscle Tone: within normal limits Gait & Station: normal Patient leans: N/A  Psychiatric Specialty Exam: Physical Exam  Nursing note and vitals reviewed.   Review of Systems  Constitutional: Negative.   HENT: Negative.   Eyes: Negative.   Respiratory: Negative.   Cardiovascular: Negative.   Gastrointestinal: Negative.   Genitourinary: Negative.   Musculoskeletal: Negative.   Skin: Negative.   Neurological: Negative.   Endo/Heme/Allergies: Negative.   Psychiatric/Behavioral: Negative for suicidal ideas. The patient is nervous/anxious.     Blood pressure (!) 155/111, pulse 90, temperature 97.8 F (36.6 C), temperature source Oral, resp. rate 20, height 5' 8"  (1.727 m), weight 74.8 kg (165 lb), SpO2 95 %.Body mass index is 25.09 kg/m.  General Appearance: Fairly Groomed  Eye Contact:  Good  Speech:  Clear and Coherent and Normal Rate  Volume:  Normal  Mood:  Anxious  Affect:  Congruent  Thought Process:  Coherent  Orientation:  Full (Time, Place, and Person)  Thought Content:  Logical  Suicidal Thoughts:  No  Homicidal Thoughts:  No  Memory:  Immediate;   Good Recent;   Good  Judgement:  Fair  Insight:  Fair  Psychomotor Activity:  Normal  Concentration:   Concentration: Good and Attention Span: Good  Recall:  Fair  Fund of Knowledge:  Good  Language:  Good  Akathisia:  No  Handed:  Right  AIMS (if indicated):     Assets:  Communication Skills Desire for Improvement Housing Physical Health Resilience  ADL's:  Intact  Cognition:  WNL  Sleep:       Case discussed with Dr. Darleene Cleaver; recommendations are: Treatment Plan Summary: Discharge home when medically cleared.   Disposition: No evidence of imminent risk to self or others at present.   Patient does not meet criteria for psychiatric inpatient admission. Supportive therapy provided about ongoing stressors.  Refer to outpatient follow-up at [facility]  Serena Colonel, [place] 10/27/2016 11:19 AM  Patient seen face-to-face for psychiatric evaluation, chart reviewed and case discussed with the physician extender and developed treatment plan. Reviewed the information documented and agree with the treatment plan. Corena Pilgrim, MD

## 2016-10-27 NOTE — ED Notes (Signed)
Marcus, TTS in room with pt.

## 2016-10-27 NOTE — ED Notes (Signed)
No respiratory or acute distress noted resting in bed with eyes closed hospital sitter watching pt. 
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
## 2016-10-27 NOTE — ED Notes (Signed)
Informed Dr Bebe ShaggyWickline of pts blood pressure 155/111 no new orders given.
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
## 2016-10-27 NOTE — BH Assessment (Addendum)
Tele Assessment Note   Zachary Russo is an 55 y.o. male who [facility], [facility]. Meanwhile, he contacted [facility] mobile crisis saying he did not get the help he needed and he wanted to jump off a bridge.  TTS was contacted, and the TA assessment from last night [facility]. Patient states that nothing has changed from his assessment last night other than stating the plan of wanting to be hit by a train or jump off a bridge.  Please see narrative copied from Marcus's note for more clinical information.  Per Beatriz StallionMarcus Harvey (TTS counselor) who completed assessment last night, "Zachary Russo is an 55 y.o. male. Patient is a 55 year old male with a long history of depression, Aspergers, anxiety who has been off medications for approximately 3 months because he lost his provider and has had trouble finding a new provider presenting today with worsening depression and suicidal ideation. Patient is hesitant to discuss his suicidal ideation but states that it would cause a large mess. He denies access to a gun. He has been suicidal in the past and attempted to overdose which was approximately 9 years ago. He currently denies feeling suicidal at this moment but states his depression is getting worse and feels himself going down that road.  Patient says that he has been getting increasingly suicidal.  He has a plan that he won't divulge.  He says he has access to that plan.  Patient has had one previous attempt to kill self.  Patient had contacted [facility].  Patient was brought to hospital by [employer].  Patient lives by himself.  Patient says that he has had two friends die in the last 6 months.  His mother died in September of 2014.  Patient says that being jobless is stressful.  He complains that he does not have a doctor or psychiatrist and has difficulty finding any that will take MCD.  Patient has no HI or A/V  hallucinations.  He does smoke marijuana less than once per week.  Last use was over two weeks ago he says.  [facility] but says he is not going back, last appt was 3 weeks ago.  [facility]".  Elta GuadeloupeLaurie Parks, NP recommends IP placement and [facility]ard[facility]Lindsey, [facility]   Diagnosis: MDD, recurrent, severe, without psychotic features Past Medical History:  Past Medical History:  Diagnosis Date  . Anxiety   . Asperger syndrome   . Depression   . H/O suicide attempt    overdose "many years ago"  . Homelessness   . Hypertension   . Prostate disorder   . Restless leg syndrome     No past surgical history on file.  Family History: No family history on file.  Social History:  reports that he has been smoking Cigars.  He has smoked for the past 2.00 years. He has never used smokeless tobacco. He reports that he uses drugs, including Marijuana. He reports that he does not drink alcohol.  Additional Social History:  Alcohol / Drug Use Pain Medications: None Prescriptions: None in last three months. Over the Counter: Tylenol as needed History of alcohol / drug use?: Yes Longest period of sobriety (when/how long): Unknown Negative Consequences of Use:  (none) Substance #1 Name of Substance 1: Marijuana 1 - Age of  First Use: early teens 1 - Amount (size/oz): a blunt 1 - Frequency: Less than once per week. 1 - Duration: on going 1 - Last Use / Amount: Two weeks ago  CIWA: CIWA-Ar BP: 122/87 Pulse Rate:  (83) COWS:    PATIENT STRENGTHS: (choose at least two) Ability for insight Average or above average intelligence Capable of independent living Communication skills Motivation for treatment/growth  Allergies:  Allergies  Allergen Reactions  . Amoxicillin Other (See Comments)    Ineffective (patient states his is resistant)  . Cephalexin Nausea And Vomiting  . Lemon Oil Itching  .  Tape Other (See Comments)    Please use "paper" tape only.  Irritation, redness  . Codeine Itching    Home Medications:  (Not in a hospital admission)  OB/GYN Status:  No LMP for male patient.  General Assessment Data [facility] T[facility] Assessment: In system Is this a Tele or Face-to-Face Assessment?: Face-to-Face Is this an Initial Assessment or a Re-assessment for this encounter?: Initial Assessment Marital status: Single Is patient pregnant?: No Pregnancy Status: No Living Arrangements: Alone Can pt return to current living arrangement?: Yes Admission Status: Voluntary Is patient capable of signing voluntary admission?: Yes Referral Source: Self/Family/Friend Insurance type: MCD  Medical Screening Exam [facility] Walk[facility]in ONLY) Medical Exam completed:  (pt admitted)  Crisis Care Plan Living Arrangements: Alone Name of Psychiatrist: None Name of Therapist: None  Education Status Is patient currently in school?: No Highest grade of school patient has completed: Some college  Risk to self with the past 6 months Suicidal Ideation: Yes-Currently Present Has patient been a risk to self within the past 6 months prior to admission? : Yes Suicidal Intent: Yes-Currently Present Has patient had any suicidal intent within the past 6 months prior to admission? : No Is patient at risk for suicide?: Yes Suicidal Plan?: Yes-Currently Present Has patient had any suicidal plan within the past 6 months prior to admission? : Yes Specify Current Suicidal Plan:  (jump off bridge or hit by train) Access to Means: Yes Specify Access to Suicidal Means: environment What has been your use of drugs/alcohol within the last 12 months?: THC Previous Attempts/Gestures: Yes How many times?: 1 Other Self Harm Risks: none Triggers for Past Attempts: Family contact Intentional Self Injurious Behavior: None Family Suicide History: No Recent stressful life event(s):  Financial Problems Persecutory voices/beliefs?: Yes Depression: Yes Depression Symptoms: Despondent, Insomnia, Tearfulness, Isolating, Loss of interest in usual pleasures, Feeling worthless/self pity, Guilt, Feeling angry/irritable Substance abuse history and/or treatment for substance abuse?: Yes Suicide prevention information given to non-admitted patients: Not applicable  Risk to Others within the past 6 months Homicidal Ideation: Yes-Currently Present Does patient have any lifetime risk of violence toward others beyond the six months prior to admission? : No Thoughts of Harm to Others: No (some HI towards the president) Current Homicidal Intent: No Current Homicidal Plan: No Access to Homicidal Means: No Identified Victim: Trump History of harm to others?: No Assessment of Violence: None Noted Does patient have access to weapons?: No Criminal Charges Pending?: No Does patient have a court date: No Is patient on probation?: No  Psychosis Hallucinations: None noted Delusions: None noted  Mental Status Report Appearance/Hygiene: Poor hygiene, In scrubs Eye Contact: Fair Motor Activity: Unremarkable Speech: Logical/coherent Level of Consciousness: Alert Mood: Depressed, Irritable, Anxious Affect: Anxious, Sad Anxiety Level: Moderate Thought Processes: Coherent, Relevant Judgement: Unimpaired Orientation: Person, Place, Situation, Time Obsessive Compulsive Thoughts/Behaviors: None  Cognitive Functioning Concentration:  Normal Memory: Recent Intact, Remote Intact IQ: Average Insight: Good Impulse Control: Fair Appetite: Fair Weight Loss: 0 Weight Gain: 0 Sleep: Decreased Total Hours of Sleep: 6 Vegetative Symptoms: Staying in bed  ADLScreening [facility] Assessment Services) Patient's cognitive ability adequate to safely complete daily activities?: Yes Patient able to express need for assistance with ADLs?: Yes Independently performs ADLs?: Yes (appropriate for  developmental age)  Prior Inpatient Therapy Prior Inpatient Therapy: Yes Prior Therapy Dates: July '16 Prior Therapy Facilty/Provider(s): [facility] Reason for Treatment: depression  Prior Outpatient Therapy Prior Outpatient Therapy: Yes Prior Therapy Dates: Current Prior Therapy Facilty/Provider(s): Elissa Lovett at [facility] Reason for Treatment: DBT therapist Does patient have an ACCT team?: No Does patient have Intensive In-House Services?  : No Does patient have [facility] services? : No Does patient have P4CC services?: No  ADL Screening (condition at time of admission) Patient's cognitive ability adequate to safely complete daily activities?: Yes Is the patient deaf or have difficulty hearing?: No Does the patient have difficulty seeing, even when wearing glasses/contacts?: Yes Does the patient have difficulty concentrating, remembering, or making decisions?: Yes Patient able to express need for assistance with ADLs?: Yes Does the patient have difficulty dressing or bathing?: No Independently performs ADLs?: Yes (appropriate for developmental age) Does the patient have difficulty walking or climbing stairs?: No Weakness of Legs: None Weakness of Arms/Hands: None  Home Assistive Devices/Equipment Home Assistive Devices/Equipment: None  Therapy Consults (therapy consults require a physician order) PT Evaluation Needed: No OT Evalulation Needed: No SLP Evaluation Needed: No Abuse/Neglect Assessment (Assessment to be complete while patient is alone) Physical Abuse: Denies Verbal Abuse: Yes, past (Comment) Sexual Abuse: Denies Exploitation of patient/patient's resources: Denies Self-Neglect: Denies Values / Beliefs Cultural Requests During Hospitalization: None Spiritual Requests During Hospitalization: None Consults Spiritual Care Consult Needed: No Social Work Consult Needed: No Merchant navy officer (For Healthcare) Does patient have an advance directive?:  No Would patient like information on creating an advanced directive?: No - patient declined information    Additional Information 1:1 In Past 12 Months?: No CIRT Risk: No Elopement Risk: No Does patient have medical clearance?: Yes     Disposition:  Disposition Initial Assessment Completed for this Encounter: Yes Disposition of Patient: Inpatient treatment program Type of inpatient treatment program: Adult Other disposition(s):  (300[facility] at [facility])  [facility] 10/27/2016 2:27 PM

## 2016-10-27 NOTE — BH Assessment (Signed)
[facility] Assessment Progress Note  Per Mojeed Akintayo, MD, this pt does not require psychiatric hospitalization at this time.  Pt is to be discharged from [facility] with recommendation to follow up with the [facility].  This has been included in pt's discharge instructions.  Pt's nurse has been notified.  Shandee Jergens, MA Triage Specialist [phone]     

## 2016-10-27 NOTE — ED Notes (Signed)
Charge nurse Sydell AxonElaina and nurse in patient's room to discuss his feelings.  He continues to say he is not suicidal now but that may be in his future plans if he does not get help.  Charge nurse tried to suggest several alternatives for his care but he said he was not stable enough to get his own medication and take it or look for a doctor on his own.  He also said he had tried [facility] but they were of no help.  He finished by saying to expect headlines in the newspaper that he had died a violent death if he cannot get help.

## 2016-10-27 NOTE — Plan of Care (Signed)
Problem: Activity: Goal: Interest or engagement in leisure activities will improve Outcome: Progressing Pt. plans to attend karaoke this evening.

## 2016-10-28 LAB — LIPID PANEL
CHOL/HDL RATIO: 5.8 ratio
Cholesterol: 179 mg/dL (ref 0–200)
HDL: 31 mg/dL — AB (ref >40)
LDL CALC: 130 mg/dL — AB (ref 0–99)
Triglycerides: 92 mg/dL (ref <150)
VLDL: 18 mg/dL (ref 0–40)

## 2016-10-28 LAB — TSH: TSH: 0.567 u[IU]/mL (ref 0.350–4.500)

## 2016-10-28 NOTE — Tx Team (Signed)
Interdisciplinary Treatment and Diagnostic Plan Update  10/28/2016 Time of Session: 9:30AM Zachary Russo MRN: [id number]  Principal Diagnosis: Major depressive disorder, recurrent episode [facility])  Secondary Diagnoses: Principal Problem:   Major depressive disorder, recurrent episode (HCC)   Current Medications:  Current Facility-Administered Medications  Medication Dose Route Frequency Provider Last Rate Last Dose  . acetaminophen (TYLENOL) tablet 650 mg  650 mg Oral Q6H PRN Acquanetta Sit, MD      . alum & mag hydroxide-simeth (MAALOX/MYLANTA) 200-200-20 MG/5ML suspension 30 mL  30 mL Oral Q4H PRN Acquanetta Sit, MD      . cloNIDine (CATAPRES) tablet 0.1 mg  0.1 mg Oral BID Acquanetta Sit, MD   0.1 mg at 10/28/16 0826  . FLUoxetine (PROZAC) capsule 40 mg  40 mg Oral Daily Acquanetta Sit, MD   40 mg at 10/28/16 4098  . gabapentin (NEURONTIN) capsule 800 mg  800 mg Oral BH-q7a Acquanetta Sit, MD   800 mg at 10/28/16 1191  . gabapentin (NEURONTIN) capsule 800 mg  800 mg Oral QHS Acquanetta Sit, MD   800 mg at 10/27/16 2145  . hydrOXYzine (ATARAX/VISTARIL) tablet 25 mg  25 mg Oral TID PRN Acquanetta Sit, MD   25 mg at 10/27/16 2145  . lisinopril (PRINIVIL,ZESTRIL) tablet 20 mg  20 mg Oral Daily Acquanetta Sit, MD   20 mg at 10/28/16 0827  . loratadine (CLARITIN) tablet 10 mg  10 mg Oral BH-q7a Acquanetta Sit, MD   10 mg at 10/28/16 4782  . magnesium hydroxide (MILK OF MAGNESIA) suspension 30 mL  30 mL Oral Daily PRN Acquanetta Sit, MD      . QUEtiapine (SEROQUEL) tablet 100 mg  100 mg Oral QHS Acquanetta Sit, MD   100 mg at 10/27/16 2145  . tamsulosin (FLOMAX) capsule 0.8 mg  0.8 mg Oral Daily Beau Fanny, FNP   0.8 mg at 10/28/16 0826  . traZODone (DESYREL) tablet 50 mg  50 mg Oral QHS PRN Acquanetta Sit, MD       PTA Medications: Prescriptions Prior to Admission  Medication Sig Dispense Refill Last Dose   . amLODipine (NORVASC) 10 MG tablet Take 1 tablet (10 mg total) by mouth daily before supper. (Patient taking differently: Take 5 mg by mouth daily before supper. ) 30 tablet 0 Past Month at Unknown time  . amLODipine (NORVASC) 5 MG tablet Take 5 mg by mouth daily.   Past Month at Unknown time  . azithromycin (ZITHROMAX Z-PAK) 250 MG tablet 2 po day one, then 1 daily x 4 days (Patient not taking: Reported on 10/26/2016) 6 tablet 0 Not Taking at Unknown time  . cetirizine (ZYRTEC) 10 MG tablet Take 10 mg by mouth daily.   Past Month at Unknown time  . ciprofloxacin (CIPRO) 500 MG tablet Take 1 tablet (500 mg total) by mouth 2 (two) times daily. (Patient not taking: Reported on 10/26/2016) 14 tablet 0 Not Taking at Unknown time  . cloNIDine (CATAPRES) 0.1 MG tablet Take 1 tablet (0.1 mg total) by mouth at bedtime. (Patient taking differently: Take 0.2 mg by mouth at bedtime. ) 30 tablet 0   . cloNIDine (CATAPRES) 0.2 MG tablet Take 0.2 mg by mouth daily.   Past Month at Unknown time  . diazepam (VALIUM) 5 MG tablet Take 1 tablet (5 mg total) by mouth daily as needed for muscle spasms. (Patient not taking: Reported on 10/26/2016) 10 tablet 0 Not Taking at  Unknown time  . FLUoxetine (PROZAC) 40 MG capsule Take 1 capsule (40 mg total) by mouth daily. 30 capsule 0 Past Month at Unknown time  . gabapentin (NEURONTIN) 400 MG capsule Take 1-2 capsules (400-800 mg total) by mouth 2 (two) times daily. 400 mg in AM and 800 mg in PM 60 capsule 0 Past Month at Unknown time  . lisinopril (PRINIVIL,ZESTRIL) 20 MG tablet Take 1 tablet (20 mg total) by mouth daily. 30 tablet 0 Past Month at Unknown time  . loratadine (CLARITIN) 10 MG tablet Take 1 tablet (10 mg total) by mouth daily. One po daily x 5 days (Patient not taking: Reported on 10/26/2016) 14 tablet 0 Not Taking at Unknown time  . montelukast (SINGULAIR) 10 MG tablet Take 10 mg by mouth at bedtime.   Past Month at Unknown time  . promethazine (PHENERGAN) 25 MG  tablet Take 1 tablet (25 mg total) by mouth every 6 (six) hours as needed for nausea. (Patient not taking: Reported on 10/26/2016) 20 tablet 0 Not Taking at Unknown time  . propranolol (INDERAL) 20 MG tablet Take 1 tablet (20 mg total) by mouth daily. 30 tablet 0 Past Month at Unknown time  . QUEtiapine (SEROQUEL) 100 MG tablet Take 100 mg by mouth at bedtime.   Past Month at Unknown time  . tamsulosin (FLOMAX) 0.4 MG CAPS capsule Take 2 capsules (0.8 mg total) by mouth daily. 60 capsule 0 Past Month at Unknown time    Patient Stressors: Financial difficulties Health problems Medication change or noncompliance  Patient Strengths: Active sense of humor Capable of independent living Communication skills General fund of knowledge Motivation for treatment/growth  Treatment Modalities: Medication Management, Group therapy, Case management,  1 to 1 session with clinician, Psychoeducation, Recreational therapy.   Physician Treatment Plan for Primary Diagnosis: Major depressive disorder, recurrent episode (HCC) Long Term Goal(s): Improvement in symptoms so as ready for discharge Improvement in symptoms so as ready for discharge   Short Term Goals: Ability to disclose and discuss suicidal ideas Ability to demonstrate self-control will improve Ability to identify changes in lifestyle to reduce recurrence of condition will improve Ability to identify and develop effective coping behaviors will improve  Medication Management: Evaluate patient's response, side effects, and tolerance of medication regimen.  Therapeutic Interventions: 1 to 1 sessions, Unit Group sessions and Medication administration.  Evaluation of Outcomes: Progressing  Physician Treatment Plan for Secondary Diagnosis: Principal Problem:   Major depressive disorder, recurrent episode (HCC)  Long Term Goal(s): Improvement in symptoms so as ready for discharge Improvement in symptoms so as ready for discharge   Short Term  Goals: Ability to disclose and discuss suicidal ideas Ability to demonstrate self-control will improve Ability to identify changes in lifestyle to reduce recurrence of condition will improve Ability to identify and develop effective coping behaviors will improve     Medication Management: Evaluate patient's response, side effects, and tolerance of medication regimen.  Therapeutic Interventions: 1 to 1 sessions, Unit Group sessions and Medication administration.  Evaluation of Outcomes: Progressing   RN Treatment Plan for Primary Diagnosis: Major depressive disorder, recurrent episode (HCC) Long Term Goal(s): Knowledge of disease and therapeutic regimen to maintain health will improve  Short Term Goals: Ability to remain free from injury will improve, Ability to disclose and discuss suicidal ideas and Ability to identify and develop effective coping behaviors will improve  Medication Management: RN will administer medications as ordered by provider, will assess and evaluate patient's response and provide education to  patient for prescribed medication. RN will report any adverse and/or side effects to prescribing provider.  Therapeutic Interventions: 1 on 1 counseling sessions, Psychoeducation, Medication administration, Evaluate responses to treatment, Monitor vital signs and CBGs as ordered, Perform/monitor CIWA, COWS, AIMS and Fall Risk screenings as ordered, Perform wound care treatments as ordered.  Evaluation of Outcomes: Progressing   LCSW Treatment Plan for Primary Diagnosis: Major depressive disorder, recurrent episode (HCC) Long Term Goal(s): Safe transition to appropriate next level of care at discharge, Engage patient in therapeutic group addressing interpersonal concerns.  Short Term Goals: Engage patient in aftercare planning with referrals and resources, Facilitate acceptance of mental health diagnosis and concerns, Facilitate patient progression through stages of change  regarding substance use diagnoses and concerns and Identify triggers associated with mental health/substance abuse issues  Therapeutic Interventions: Assess for all discharge needs, 1 to 1 time with Social worker, Explore available resources and support systems, Assess for adequacy in community support network, Educate family and significant other(s) on suicide prevention, Complete Psychosocial Assessment, Interpersonal group therapy.  Evaluation of Outcomes: Progressing   Progress in Treatment: Attending groups: No. New to unit. Continuing to assess.  Participating in groups: No. Taking medication as prescribed: Yes. Toleration medication: Yes. Family/Significant other contact made: No, will contact:  family member if patient consents. Patient understands diagnosis: Yes. Discussing patient identified problems/goals with staff: Yes. Medical problems stabilized or resolved: Yes. Denies suicidal/homicidal ideation: Yes. Issues/concerns per patient self-inventory: No. Other: n/a  New problem(s) identified: No, Describe:  n/a  New Short Term/Long Term Goal(s): develop effective aftercare plan; medication stabilization.   Discharge Plan or Barriers: CSW assessing for appropriate referrals. Pt did not attend morning discharge planning group.   Reason for Continuation of Hospitalization: Depression Medication stabilization   Estimated Length of Stay: 3-5 days   Attendees: Patient: 10/28/2016 9:01 AM  Physician: Dr. Vallery RidgeElizabeth Oates MD 10/28/2016 9:01 AM  Nursing: Gilda CreaseBobbie, Jane RN 10/28/2016 9:01 AM  RN Care Manager: Onnie BoerJennifer Clark CM 10/28/2016 9:01 AM  Social Worker: Trula SladeHeather Smart, LCSW 10/28/2016 9:01 AM  Recreational Therapist:  10/28/2016 9:01 AM  Other: Claudette Headonrad Withrow NP; Gray BernhardtMay Augustin NP 10/28/2016 9:01 AM  Other:  10/28/2016 9:01 AM  Other: 10/28/2016 9:01 AM    Scribe for Treatment Team: Ledell PeoplesHeather N Smart, LCSW 10/28/2016 9:01 AM

## 2016-10-28 NOTE — Progress Notes (Signed)
Incidental note  Patient approached me a lot with nurse to report that he was very upset that he apparently been quetioned for being on [address] which he felt was unfair as he went over there to participate in programming as directed. It was explained to the patient that this was a misunderstanding and that possibly some staff were not aware that he was going over to [address] to program and the staff  informed him we would make sure that everybody  much as possible was aware that he needed to be there for groups. Patient agreed that he would try to attend groups.

## 2016-10-28 NOTE — Progress Notes (Signed)
Pt attended wrap up group on the [address] hall.  

## 2016-10-28 NOTE — [facility] Group Notes (Signed)
[facility] LCSW Group Therapy  10/28/2016 2:18 PM  Type of Therapy:  Group Therapy  Participation Level:  Did Not Attend-pt invited. Chose to remain in bed.   Summary of Progress/Problems: Feelings around Relapse. Group members discussed the meaning of relapse and shared personal stories of relapse, how it affected them and others, and how they perceived themselves during this time. Group members were encouraged to identify triggers, warning signs and coping skills used when facing the possibility of relapse. Social supports were discussed and explored in detail. Post Acute Withdrawal Syndrome (handout provided) was introduced and examined. Pt's were encouraged to ask questions, talk about key points associated with PAWS, and process this information in terms of relapse prevention.   Ahaana Rochette N Smart LCSW 10/28/2016, 2:18 PM

## 2016-10-28 NOTE — Progress Notes (Signed)
[facility]  10/28/2016 9:12 AM  Patient Active Problem List   Diagnosis Date Noted  . 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 Major depressive disorder, recurrent episode (HCC) 07/07/2015  . Anxiety disorder 01/27/2015  . Adjustment disorder with depressed mood 01/18/2015  . Acute encephalopathy 12/18/2013  . Delirium 12/18/2013  . 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 Abnormal brain CT 12/18/2013    Diagnosis: Major depressive disorder  Subjective: Patient states he feels "improved" "just being here." He feels that he is getting back on his medications and addressing his stressors and this is a positive. He denies current suicidal or homicidal ideation, plan or intent. Patient's blood pressure has been running slightly low and we discussed whether he needs modification to his regimen as he is on 4 medications that can lower blood pressure Flomax, Seroquel, clonidine and lisinopril. Currently patient thinks he received some extra doses of medication yesterday in the ER and would like to monitor how he responds before we make changes. He denies any current symptoms of hypotension.  Objective: Well developed well nourished man in no apparent distress casually dressed and fairly well groomed mood is "improved" and affect is mildly anxious but overall euthymic, thought processes linear and goal-directed, thought content no suicidal or homicidal ideation, plan or intent alert and oriented 4 insight and judgment are fair IQ appears in average range     Current Facility-Administered Medications (Cardiovascular):  .  cloNIDine (CATAPRES) tablet 0.1 mg .  lisinopril (PRINIVIL,ZESTRIL) tablet 20 mg   Current Facility-Administered Medications (Respiratory):  .  loratadine (CLARITIN) tablet 10 mg   Current Facility-Administered Medications (Analgesics):  .  acetaminophen (TYLENOL) tablet 650 mg     Current Facility-Administered  Medications (Other):  .  alum & mag hydroxide-simeth (MAALOX/MYLANTA) 200-200-20 MG/5ML suspension 30 mL .  FLUoxetine (PROZAC) capsule 40 mg .  gabapentin (NEURONTIN) capsule 800 mg .  gabapentin (NEURONTIN) capsule 800 mg .  hydrOXYzine (ATARAX/VISTARIL) tablet 25 mg .  magnesium hydroxide (MILK OF MAGNESIA) suspension 30 mL .  QUEtiapine (SEROQUEL) tablet 100 mg .  tamsulosin (FLOMAX) capsule 0.8 mg .  traZODone (DESYREL) tablet 50 mg  No current outpatient prescriptions on file.  Vital Signs:Blood pressure 97/77, pulse (!) 102, temperature 98.2 F (36.8 C), temperature source Oral, resp. rate 16, height 5\' 8"  (1.727 m), weight 78.5 kg (173 lb), SpO2 99 %.    Lab Results:  Results for orders placed or performed during the hospital encounter of 10/27/16 (from the past 48 hour(s))  TSH     Status: None   Collection Time: 10/28/16  6:14 AM  Result Value Ref Range   TSH 0.567 0.350 - 4.500 uIU/mL    Comment: Performed by a 3rd Generation assay with a functional sensitivity of <=0.01 uIU/mL. Performed at [facility]     Physical Findings: AIMS: Facial and Oral Movements Muscles of Facial Expression: None, normal Lips and Perioral Area: None, normal Jaw: None, normal Tongue: None, normal,Extremity Movements Upper (arms, wrists, hands, fingers): None, normal Lower (legs, knees, ankles, toes): None, normal, Trunk Movements Neck, shoulders, hips: None, normal, Overall Severity Severity of abnormal movements (highest score from questions above): None, normal Incapacitation due to abnormal movements: None, normal Patient's awareness of abnormal movements (rate only patient's report): No Awareness, Dental Status Current problems with teeth and/or dentures?: No Does patient usually wear dentures?: No  CIWA:    COWS:      Assessment/Plan: Patient denies side  effects since resuming medications and states he is not experiencing any symptoms of low blood pressure.  We will continue to monitor his medications and adjust as needed. Patient will continue to work with the social worker on improving his access to outpatient resources.  Acquanetta SitElizabeth Woods Ignazio Kincaid, MD 10/28/2016, 9:12 AM

## 2016-10-28 NOTE — Progress Notes (Signed)
D: Patient's self inventory sheet: patient has "amazing" sleep, recieved sleep medication.good  Appetite, normal energy level, good concentration. Rated depression 7/10, hopeless 10/10, anxiety 6/10. SI/HI/AVH: denies all. Physical complaints are denied. Goal is "finding doctors". Plans to work on "?".  Patient was angry at group time because he arrived in 400 hall dayroom early and was asked to leave. He stated that he would not attend any more groups today. He also complained that groups do not start on time. A: Medications administered, assessed medication knowledge and education given on medication regimen.  Emotional support and encouragement given patient. R: Denies SI and HI , contracts for safety. Safety maintained with 15 minute checks.

## 2016-10-28 NOTE — Progress Notes (Signed)
Recreation Therapy Notes  Date: 10/28/16 Time: 0930 Location: 300 Hall Dayroom  Group Topic: Stress Management  Goal Area(s) Addresses:  Patient will verbalize importance of using healthy stress management.  Patient will identify positive emotions associated with healthy stress management.   Intervention: Stress Management  Activity :  Progressive Muscle Relaxation.  LRT introduced the stress management technique of progressive muscle relaxation.  LRT read a script to guide patients through the technique.  Patients were to follow along as LRT read script.  Education:  Stress Management, Discharge Planning.   Education Outcome: Acknowledges edcuation/In group clarification offered/Needs additional education  Clinical Observations/Feedback: Pt did not attend  group.    Caroll RancherMarjette Samarah Hogle, LRT/CTRS         Caroll RancherLindsay, Labrisha Wuellner A 10/28/2016 11:41 AM

## 2016-10-28 NOTE — Progress Notes (Signed)
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

## 2016-10-28 NOTE — [facility] Suicide Risk Assessment (Signed)
[facility] INPATIENT:  Family/Significant Other Suicide Prevention Education  Suicide Prevention Education:  Patient Refusal for Family/Significant Other Suicide Prevention Education: The patient [name] Angus has refused to provide written consent for family/significant other to be provided Family/Significant Other Suicide Prevention Education during admission and/or prior to discharge.  Physician notified.  SPE completed with pt, as pt refused to consent to family contact. SPI pamphlet provided to pt and pt was encouraged to share information with support network, ask questions, and talk about any concerns relating to SPE. Pt denies access to guns/firearms and verbalized understanding of information provided. Mobile Crisis information also provided to pt.   Zamier Eggebrecht N Smart LCSW 10/28/2016, 10:48 AM

## 2016-10-28 NOTE — Progress Notes (Signed)
Adult Psychoeducational Group Note  Date:  10/28/2016 Time:  10:43 PM  Group Topic/Focus:  Wrap-Up Group:   The focus of this group is to help patients review their daily goal of treatment and discuss progress on daily workbooks.   Participation Level:  Active  Participation Quality:  Appropriate  Affect:  Appropriate  Cognitive:  Alert, Appropriate and Oriented  Insight: Appropriate  Engagement in Group:  Engaged  Modes of Intervention:  Discussion  Additional Comments:  Patient attended wrap-up group and expressed that his day was a 7.  His coping skills for today were coloring and socializing.  Evangelyn Crouse W Alekai Pocock 10/28/2016, 10:43 PM

## 2016-10-29 DIAGNOSIS — Z79899 Other long term (current) drug therapy: Secondary | ICD-10-CM

## 2016-10-29 DIAGNOSIS — F17210 Nicotine dependence, cigarettes, uncomplicated: Secondary | ICD-10-CM

## 2016-10-29 DIAGNOSIS — F331 Major depressive disorder, recurrent, moderate: Secondary | ICD-10-CM

## 2016-10-29 LAB — HEMOGLOBIN A1C
HEMOGLOBIN A1C: 5.7 % — AB (ref 4.8–5.6)
Mean Plasma Glucose: 117 mg/dL

## 2016-10-29 MED ORDER — PROPRANOLOL HCL 10 MG PO TABS
20.0000 mg | ORAL_TABLET | Freq: Every day | ORAL | Status: DC | PRN
  Administered 2016-10-29 – 2016-10-30 (×2): 20 mg via ORAL
  Filled 2016-10-29 (×2): qty 2

## 2016-10-29 NOTE — Progress Notes (Signed)
Pt attended wrap up group on the [address] hall.  

## 2016-10-29 NOTE — Progress Notes (Signed)
[facility] MD Progress Note  10/29/2016 11:56 AM Zachary Russo  MRN:  [id number]   Subjective: patient reports  " I am a little perturbed that group hasn't started on time today." Objective: Zachary Russo is awake, alert and oriented *4. Seen resting in the dayroom interacting with peers and nursing students. Denies suicidal or homicidal ideation during this assessment . Denies auditory or visual hallucination and does not appear to be responding to internal stimuli. Patient has concerns regarding restarting inderal for is heart palpations and anxiety.   Patient reports he is medication compliant without mediation side effects. Reports he is unable to attended group session after they have started and is unwilling to interrupt.  States his depression 7/10. Support, encouragement and reassurance was provided.   Principal Problem: Major depressive disorder, recurrent episode (HCC) Diagnosis:   Patient Active Problem List   Diagnosis Date Noted  . MDD (major depressive disorder), recurrent severe, without psychosis (HCC) [F33.2] 07/19/2015  . Major depressive disorder, recurrent episode (HCC) [F33.9] 07/07/2015  . Anxiety disorder [F41.9] 01/27/2015  . Adjustment disorder with depressed mood [F43.21] 01/18/2015  . Acute encephalopathy [G93.40] 12/18/2013  . Delirium [R41.0] 12/18/2013  . 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 Abnormal brain CT [R90.89] 12/18/2013   Total Time spent with patient: 30 minutes  Past Psychiatric History:   Past Medical History:  Past Medical History:  Diagnosis Date  . Anxiety   . Asperger syndrome   . Depression   . H/O suicide attempt    overdose "many years ago"  . Homelessness   . Hypertension   . Prostate disorder   . Restless leg syndrome    History reviewed. No pertinent surgical history. Family History: History reviewed. No pertinent family history. Family Psychiatric  History:  Social History:  History  Alcohol Use No     History  Drug Use  .  Types: Marijuana    Comment: Pt reports last use was 2-3 weeks ago    Social History   Social History  . Marital status: Single    Spouse name: N/A  . Number of children: N/A  . Years of education: N/A   Social History Main Topics  . Smoking status: Light Tobacco Smoker    Years: 2.00    Types: Cigars  . Smokeless tobacco: Never Used  . Alcohol use No  . Drug use:     Types: Marijuana     Comment: Pt reports last use was 2-3 weeks ago  . Sexual activity: Not Currently   Other Topics Concern  . None   Social History Narrative  . None   Additional Social History:    Pain Medications: None Prescriptions: None in last three months. Over the Counter: Tylenol as needed History of alcohol / drug use?: Yes Longest period of sobriety (when/how long): Unknown Negative Consequences of Use:  (none) Name of Substance 1: Marijuana 1 - Age of First Use: early teens 1 - Amount (size/oz): a blunt 1 - Frequency: Less than once per week. 1 - Duration: on going 1 - Last Use / Amount: Two weeks ago                  Sleep: Good  Appetite:  Fair  Current Medications: Current Facility-Administered Medications  Medication Dose Route Frequency Provider Last Rate Last Dose  . acetaminophen (TYLENOL) tablet 650 mg  650 mg Oral Q6H PRN Acquanetta Sit, MD      . alum & mag hydroxide-simeth (MAALOX/MYLANTA) 200-200-20 MG/5ML  suspension 30 mL  30 mL Oral Q4H PRN Acquanetta SitElizabeth Woods Oates, MD      . cloNIDine (CATAPRES) tablet 0.1 mg  0.1 mg Oral BID Acquanetta SitElizabeth Woods Oates, MD   0.1 mg at 10/29/16 [id number]  . FLUoxetine (PROZAC) capsule 40 mg  40 mg Oral Daily Acquanetta SitElizabeth Woods Oates, MD   40 mg at 10/29/16 [id number]  . gabapentin (NEURONTIN) capsule 800 mg  800 mg Oral BH-q7a Acquanetta SitElizabeth Woods Oates, MD   800 mg at 10/29/16 0558  . gabapentin (NEURONTIN) capsule 800 mg  800 mg Oral QHS Acquanetta SitElizabeth Woods Oates, MD   800 mg at 10/28/16 2213  . hydrOXYzine (ATARAX/VISTARIL) tablet 25 mg  25 mg Oral  TID PRN Acquanetta SitElizabeth Woods Oates, MD   25 mg at 10/28/16 2214  . lisinopril (PRINIVIL,ZESTRIL) tablet 20 mg  20 mg Oral Daily Acquanetta SitElizabeth Woods Oates, MD   20 mg at 10/29/16 [id number]  . loratadine (CLARITIN) tablet 10 mg  10 mg Oral BH-q7a Acquanetta SitElizabeth Woods Oates, MD   10 mg at 10/29/16 0559  . magnesium hydroxide (MILK OF MAGNESIA) suspension 30 mL  30 mL Oral Daily PRN Acquanetta SitElizabeth Woods Oates, MD      . QUEtiapine (SEROQUEL) tablet 100 mg  100 mg Oral QHS Acquanetta SitElizabeth Woods Oates, MD   100 mg at 10/28/16 2213  . tamsulosin (FLOMAX) capsule 0.8 mg  0.8 mg Oral Daily Beau FannyJohn C Withrow, FNP   0.8 mg at 10/29/16 [id number]    Lab Results:  Results for orders placed or performed during the hospital encounter of 10/27/16 (from the past 48 hour(s))  Hemoglobin A1c     Status: Abnormal   Collection Time: 10/28/16  6:14 AM  Result Value Ref Range   Hgb A1c MFr Bld 5.7 (H) 4.8 - 5.6 %    Comment: (NOTE)         Pre-diabetes: 5.7 - 6.4         Diabetes: >6.4         Glycemic control for adults with diabetes: <7.0    Mean Plasma Glucose 117 mg/dL    Comment: (NOTE) Performed At: [facility] [address] [place], [place] [id number] Mila HomerHancock William F MD ZH:[phone] Performed at [facility]   TSH     Status: None   Collection Time: 10/28/16  6:14 AM  Result Value Ref Range   TSH 0.567 0.350 - 4.500 uIU/mL    Comment: Performed by a 3rd Generation assay with a functional sensitivity of <=0.01 uIU/mL. Performed at [facility]   Lipid panel     Status: Abnormal   Collection Time: 10/28/16  6:14 AM  Result Value Ref Range   Cholesterol 179 0 - 200 mg/dL   Triglycerides 92 <295<150 mg/dL   HDL 31 (L) >28>40 mg/dL   Total CHOL/HDL Ratio 5.8 RATIO   VLDL 18 0 - 40 mg/dL   LDL Cholesterol 413130 (H) 0 - 99 mg/dL    Comment:        Total Cholesterol/HDL:CHD Risk Coronary Heart Disease Risk Table                     Men   Women  1/2 Average Risk   3.4   3.3  Average Risk        5.0   4.4  2 X Average Risk   9.6   7.1  3 X Average Risk  23.4   11.0  Use the calculated Patient Ratio above and the CHD Risk Table to determine the patient's CHD Risk.        ATP III CLASSIFICATION (LDL):  <100     mg/dL   Optimal  161-096  mg/dL   Near or Above                    Optimal  130-159  mg/dL   Borderline  045-409  mg/dL   High  >811     mg/dL   Very High Performed at [facility]     Blood Alcohol level:  Lab Results  Component Value Date   [facility] <5 10/26/2016   ETH <5 09/01/2015    Metabolic Disorder Labs: Lab Results  Component Value Date   HGBA1C 5.7 (H) 10/28/2016   MPG 117 10/28/2016   MPG 131 07/09/2015   No results found for: PROLACTIN Lab Results  Component Value Date   CHOL 179 10/28/2016   TRIG 92 10/28/2016   HDL 31 (L) 10/28/2016   CHOLHDL 5.8 10/28/2016   VLDL 18 10/28/2016   LDLCALC 130 (H) 10/28/2016   LDLCALC 124 (H) 07/09/2015    Physical Findings: AIMS: Facial and Oral Movements Muscles of Facial Expression: None, normal Lips and Perioral Area: None, normal Jaw: None, normal Tongue: None, normal,Extremity Movements Upper (arms, wrists, hands, fingers): None, normal Lower (legs, knees, ankles, toes): None, normal, Trunk Movements Neck, shoulders, hips: None, normal, Overall Severity Severity of abnormal movements (highest score from questions above): None, normal Incapacitation due to abnormal movements: None, normal Patient's awareness of abnormal movements (rate only patient's report): No Awareness, Dental Status Current problems with teeth and/or dentures?: No Does patient usually wear dentures?: No  CIWA:    COWS:     Musculoskeletal: Strength & Muscle Tone: within normal limits Gait & Station: normal Patient leans: N/A  Psychiatric Specialty Exam: Physical Exam  Nursing note and vitals reviewed. Constitutional: He is oriented to person, place, and time. He appears well-developed.  Neurological: He  is alert and oriented to person, place, and time.  Psychiatric: He has a normal mood and affect. His behavior is normal.    Review of Systems  Psychiatric/Behavioral: Positive for depression, substance abuse and suicidal ideas. Negative for hallucinations. The patient is nervous/anxious and has insomnia.     Blood pressure 113/83, pulse 78, temperature 97.5 F (36.4 C), resp. rate 18, height 5\' 8"  (1.727 m), weight 78.5 kg (173 lb), SpO2 99 %.Body mass index is 26.3 kg/m.  General Appearance: Casual  Eye Contact:  Fair  Speech:  Clear and Coherent  Volume:  Normal  Mood:  Anxious  Affect:  Appropriate  Thought Process:  Coherent  Orientation:  Full (Time, Place, and Person)  Thought Content:  Hallucinations: Visual  Suicidal Thoughts:  No  Homicidal Thoughts:  No  Memory:  Immediate;   Fair Recent;   Fair Remote;   Fair  Judgement:  Fair  Insight:  Fair  Psychomotor Activity:  Restlessness  Concentration:  Concentration: Fair  Recall:  [employer] of Knowledge:  Fair  Language:  Fair  Akathisia:  No  Handed:  Right  AIMS (if indicated):     Assets:  Communication Skills Resilience Social Support  ADL's:  Intact  Cognition:  WNL  Sleep:  Number of Hours: 5.5     I agree with current treatment plan on 10/29/2016, Patient seen face-to-face for psychiatric evaluation follow-up, chart reviewed. Reviewed the information documented and agree  with the treatment plan.  Treatment Plan Summary: Daily contact with patient to assess and evaluate symptoms and progress in treatment and Medication management   Continue with Prozac 40 mg  for mood stabilization. Continue with Seroquel 100 mg for insomnia PRN inderal 20 mg for anxiety ( B/P guidelines)  Will continue to monitor vitals ,medication compliance and treatment side effects while patient is here.  Reviewed labs: Lipid panel abnormal BAL - , UDS - pos +thc  CSW will start working on disposition.  Patient to participate in  therapeutic milieu  Oneta Rackanika N Lewis, NP 10/29/2016, 11:56 AM

## 2016-10-29 NOTE — [facility] Group Notes (Signed)
Patient did not attend Psychosocial Group led by RN. 

## 2016-10-29 NOTE — [facility] Group Notes (Signed)
Adult Group Therapy Note  Date:  10/29/2016  Time: 10:00AM-11:00AM  Group Topic/Focus: Today's group focused on the topic of life problems and healthy coping skills.  An exercise was performed which elicited specific problems that led to various patients being admitted to this hospital, giving an opportunity for other patients to identify with those life issues.  After a discussion of each, an unhealthy coping skill and suggestions for healthy coping skills to deal with that problems were named.  These were also listed on the whiteboard.   Reflective listening was used to help patients connect with each other on similarities rather than to focus on differences.  Participation Level:  Active  Participation Quality:  Appropriate, Attentive, Sharing and Supportive  Affect:  Anxious and Blunted  Cognitive:  Appropriate  Insight: Good  Engagement in Group:  Engaged  Modes of Intervention:  Activity, Discussion and Support  Additional Comments:  The patient expressed that he had become homeless, but recently had disability and Medicaid approved.  He missed appointments for various reasons, so was without medications for 3 months.  He was astonished that his unhealthy coping skills list was the exact same one as someone with substance abuse issues who talked earlier in the group.  He stated he wants to go to groups on both 300 and 400 halls.  Carloyn JaegerMareida J Grossman-Orr 10/29/2016 , 12:30 PM

## 2016-10-29 NOTE — Progress Notes (Signed)
D: Patient's self inventory sheet: patient has poor sleep, with sleep medication.good  Appetite, normal energy level, poor concentration. Rated depression 6/10, hopeless 7/10, anxiety 7/10. SI/HI/AVH: intermittent SI. Physical complaints are denied. Goal is "not sure. Help me find doctors". Plans to work on "social workers". Patient showed some humor, stating on his inventory (Is there anything else that you would like to tell staff): "Zettie CooleyOswald did not act alone, and may not have been involved at all. Gilberto BetterJ. Edgar HOover, [employer] and Wynn MaudlinGeorge HW Bush planned it"   A: Medications administered, assessed medication knowledge and education given on medication regimen.  Emotional support and encouragement given patient. R: denies SI and HI , contracts for safety. Safety maintained with 15 minute checks.

## 2016-10-30 MED ORDER — QUETIAPINE FUMARATE 25 MG PO TABS: 25.0000 mg | ORAL_TABLET | Freq: Two times a day (BID) | ORAL | Status: DC | PRN

## 2016-10-30 MED ORDER — CLONAZEPAM 0.5 MG PO TABS
0.5000 mg | ORAL_TABLET | Freq: Every day | ORAL | Status: DC
  Administered 2016-10-30 – 2016-10-31 (×2): 0.5 mg via ORAL
  Filled 2016-10-30 (×2): qty 1

## 2016-10-30 NOTE — Progress Notes (Signed)
After lengthy discussion with pt regarding Asperger's, anxiety, coping skills, and severe insomnia, made the following changes to medication regimen:  -Discontinue Inderal (pt is on multiple anti-hypertensives and this should not be PRN as it can be synergistic with BP meds and cause acute hypotensive episodes) -Replace the Inderal with Seroquel 25mg  po bid prn anxiety (severe daytime anxiety due to ASD/Asperger's/social anxiety) -Continue the Seroquel 100mg  po qhs also as this is well-tolerated and pt has been drowsy in AM on higher doses historically -Add Klonopin 0.5mg  qhs for severe insomnia (pt has a longstanding history of taking this for sleep only with great efficacy; Dr. Lolly MustacheArfeen suggested considering this med)  Beau FannyWithrow, Jenny Omdahl C, FNP 10/30/2016 7:02 PM

## 2016-10-30 NOTE — Progress Notes (Signed)
Adult Psychoeducational Group Note  Date:  10/30/2016 Time:  1:13 AM  Group Topic/Focus:  Wrap-Up Group:   The focus of this group is to help patients review their daily goal of treatment and discuss progress on daily workbooks.   Participation Level:  Active  Participation Quality:  Appropriate  Affect:  Appropriate  Cognitive:  Alert, Appropriate and Oriented  Insight: Appropriate  Engagement in Group:  Defensive  Modes of Intervention:  Discussion  Additional Comments: Patient attended wrap up group on 400 hall and said he was upset about not be called to morning group. Writer apologized behave of staff and said we will be better next time.   Logyn Dedominicis W Pablo Mathurin 10/30/2016, 1:13 AM

## 2016-10-30 NOTE — Progress Notes (Signed)
D  Pt was upset over his inderall being discontinued   He said he could understand the reasoning behind it but that he would rather have discussed it and given up another one of his anti hypertensives   He is also upset that we are not following the schedule and said he would never voluntarily come here again A   Verbal support and encouragement given  Validate patient concerns  Medications administered and effectiveness monitored    Q 15 min checks R   Pt is safe at present time

## 2016-10-30 NOTE — Progress Notes (Signed)
[facility]  10/30/2016 11:41 AM Zachary Russo  MRN:  [id number]   Subjective: patient reports  " I didn't sleep well last night because they will not give me Klonopin."  - patient reports " we have a problem, none to the [place] groups start on time around here."  Objective: Zachary Russo is awake, alert and oriented *4. Seen resting in his bedroom. Denies suicidal or homicidal ideation during this assessment . Denies auditory or visual hallucination and does not appear to be responding to internal stimuli.  Patient is irritable and agitated regarding treatment. Patient reports " I have Asperger and groups need to start according to schedule." Patient reports he is medication compliant without mediation side effects.  Patient continues to report he is unable to attended group session after they have started. States that none of the groups pertain to him. States his depression 7/10. Support, encouragement and reassurance was provided.   Principal Problem: Major depressive disorder, recurrent episode (HCC) Diagnosis:   Patient Active Problem List   Diagnosis Date Noted  . MDD (major depressive disorder), recurrent severe, without psychosis (HCC) [F33.2] 07/19/2015  . Major depressive disorder, recurrent episode (HCC) [F33.9] 07/07/2015  . Anxiety disorder [F41.9] 01/27/2015  . Adjustment disorder with depressed mood [F43.21] 01/18/2015  . Acute encephalopathy [G93.40] 12/18/2013  . Delirium [R41.0] 12/18/2013  . 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 Abnormal brain CT [R90.89] 12/18/2013   Total Time spent with patient: 30 minutes  Past Psychiatric History:   Past Medical History:  Past Medical History:  Diagnosis Date  . Anxiety   . Asperger syndrome   . Depression   . H/O suicide attempt    overdose "many years ago"  . Homelessness   . Hypertension   . Prostate disorder   . Restless leg syndrome    History reviewed. No pertinent surgical history. Family History:  History reviewed. No pertinent family history. Family Psychiatric  History:  Social History:  History  Alcohol Use No     History  Drug Use  . Types: Marijuana    Comment: Pt reports last use was 2-3 weeks ago    Social History   Social History  . Marital status: Single    Spouse name: N/A  . Number of children: N/A  . Years of education: N/A   Social History Main Topics  . Smoking status: Light Tobacco Smoker    Years: 2.00    Types: Cigars  . Smokeless tobacco: Never Used  . Alcohol use No  . Drug use:     Types: Marijuana     Comment: Pt reports last use was 2-3 weeks ago  . Sexual activity: Not Currently   Other Topics Concern  . None   Social History Narrative  . None   Additional Social History:    Pain Medications: None Prescriptions: None in last three months. Over the Counter: Tylenol as needed History of alcohol / drug use?: Yes Longest period of sobriety (when/how long): Unknown Negative Consequences of Use:  (none) Name of Substance 1: Marijuana 1 - Age of First Use: early teens 1 - Amount (size/oz): a blunt 1 - Frequency: Less than once per week. 1 - Duration: on going 1 - Last Use / Amount: Two weeks ago                  Sleep: Good  Appetite:  Fair  Current Medications: Current Facility-Administered Medications  Medication Dose Route Frequency Provider Last Rate Last  Dose  . acetaminophen (TYLENOL) tablet 650 mg  650 mg Oral Q6H PRN Acquanetta SitElizabeth Woods Oates, MD      . alum & mag hydroxide-simeth (MAALOX/MYLANTA) 200-200-20 MG/5ML suspension 30 mL  30 mL Oral Q4H PRN Acquanetta SitElizabeth Woods Oates, MD      . cloNIDine (CATAPRES) tablet 0.1 mg  0.1 mg Oral BID Acquanetta SitElizabeth Woods Oates, MD   0.1 mg at 10/30/16 0754  . FLUoxetine (PROZAC) capsule 40 mg  40 mg Oral Daily Acquanetta SitElizabeth Woods Oates, MD   40 mg at 10/30/16 0754  . gabapentin (NEURONTIN) capsule 800 mg  800 mg Oral BH-q7a Acquanetta SitElizabeth Woods Oates, MD   800 mg at 10/30/16 [id number]  . gabapentin  (NEURONTIN) capsule 800 mg  800 mg Oral QHS Acquanetta SitElizabeth Woods Oates, MD   800 mg at 10/29/16 2128  . hydrOXYzine (ATARAX/VISTARIL) tablet 25 mg  25 mg Oral TID PRN Acquanetta SitElizabeth Woods Oates, MD   25 mg at 10/29/16 2128  . lisinopril (PRINIVIL,ZESTRIL) tablet 20 mg  20 mg Oral Daily Acquanetta SitElizabeth Woods Oates, MD   20 mg at 10/30/16 0754  . loratadine (CLARITIN) tablet 10 mg  10 mg Oral BH-q7a Acquanetta SitElizabeth Woods Oates, MD   10 mg at 10/30/16 [id number]  . magnesium hydroxide (MILK OF MAGNESIA) suspension 30 mL  30 mL Oral Daily PRN Acquanetta SitElizabeth Woods Oates, MD      . propranolol (INDERAL) tablet 20 mg  20 mg Oral Daily PRN Oneta Rackanika N Lewis, NP   20 mg at 10/29/16 1350  . QUEtiapine (SEROQUEL) tablet 100 mg  100 mg Oral QHS Acquanetta SitElizabeth Woods Oates, MD   100 mg at 10/29/16 2128  . tamsulosin (FLOMAX) capsule 0.8 mg  0.8 mg Oral Daily Beau FannyJohn C Withrow, FNP   0.8 mg at 10/30/16 [id number]    Lab Results:  No results found for this or any previous visit (from the past 48 hour(s)).  Blood Alcohol level:  Lab Results  Component Value Date   ETH <5 10/26/2016   ETH <5 09/01/2015    Metabolic Disorder Labs: Lab Results  Component Value Date   HGBA1C 5.7 (H) 10/28/2016   MPG 117 10/28/2016   MPG 131 07/09/2015   No results found for: PROLACTIN Lab Results  Component Value Date   CHOL 179 10/28/2016   TRIG 92 10/28/2016   HDL 31 (L) 10/28/2016   CHOLHDL 5.8 10/28/2016   VLDL 18 10/28/2016   LDLCALC 130 (H) 10/28/2016   LDLCALC 124 (H) 07/09/2015    Physical Findings: AIMS: Facial and Oral Movements Muscles of Facial Expression: None, normal Lips and Perioral Area: None, normal Jaw: None, normal Tongue: None, normal,Extremity Movements Upper (arms, wrists, hands, fingers): None, normal Lower (legs, knees, ankles, toes): None, normal, Trunk Movements Neck, shoulders, hips: None, normal, Overall Severity Severity of abnormal movements (highest score from questions above): None, normal Incapacitation due to abnormal  movements: None, normal Patient's awareness of abnormal movements (rate only patient's report): No Awareness, Dental Status Current problems with teeth and/or dentures?: No Does patient usually wear dentures?: No  CIWA:    COWS:     Musculoskeletal: Strength & Muscle Tone: within normal limits Gait & Station: normal Patient leans: N/A  Psychiatric Specialty Exam: Physical Exam  Nursing note and vitals reviewed. Constitutional: He is oriented to person, place, and time. He appears well-developed.  Neurological: He is alert and oriented to person, place, and time.  Psychiatric: He has a normal mood and affect. His behavior is normal.    Review  of Systems  Psychiatric/Behavioral: Positive for depression, substance abuse and suicidal ideas. Negative for hallucinations. The patient is nervous/anxious and has insomnia.     Blood pressure (!) 129/96, pulse 80, temperature 97.8 F (36.6 C), temperature source Oral, resp. rate 16, height 5\' 8"  (1.727 m), weight 78.5 kg (173 lb), SpO2 99 %.Body mass index is 26.3 kg/m.  General Appearance: Casual  Eye Contact:  Fair  Speech:  Clear and Coherent  Volume:  Normal  Mood:  Anxious  Affect:  Appropriate  Thought Process:  Coherent  Orientation:  Full (Time, Place, and Person)  Thought Content:  Hallucinations: Visual  Suicidal Thoughts:  No  Homicidal Thoughts:  No  Memory:  Immediate;   Fair Recent;   Fair Remote;   Fair  Judgement:  Fair  Insight:  Fair  Psychomotor Activity:  Restlessness  Concentration:  Concentration: Fair  Recall:  [employer] of Knowledge:  Fair  Language:  Fair  Akathisia:  No  Handed:  Right  AIMS (if indicated):     Assets:  Communication Skills Resilience Social Support  ADL's:  Intact  Cognition:  WNL  Sleep:  Number of Hours: 6.25     I agree with current treatment plan on 10/30/2016, Patient seen face-to-face for psychiatric evaluation follow-up, chart reviewed. Reviewed the information  documented and agree with the treatment plan.  Treatment Plan Summary: Daily contact with patient to assess and evaluate symptoms and progress in treatment and Medication management   Continue with Prozac 40 mg  for mood stabilization. Continue with Seroquel 100 mg for insomnia PRN inderal 20 mg for anxiety ( B/P guidelines)  Will continue to monitor vitals ,medication compliance and treatment side effects while patient is here.  Reviewed labs: Lipid panel abnormal BAL - , UDS - pos +thc  CSW will start working on disposition.  Patient to participate in therapeutic milieu  Oneta Rack, NP 10/30/2016, 11:41 AM

## 2016-10-30 NOTE — [facility] Group Notes (Signed)
Adult Therapy Group Note  Date:  10/30/2016 Time:  9:00-10:00AM  Group Topic/Focus:  Today's group focused on patients' feelings about being consumers of mental health services over the course of their lifetimes.  There was a healthy sharing of disappointments they have experienced, as well as what they think could be helpful to them.  Various group members spoke up with complaints and CSW attempted to address these in general terms.  Support was provided among group members and from CSW to explore ways to express their needs to mental health professionals.  Participation Level:  Active  Participation Quality:  Monopolizing and Redirectable  Affect:  Blunted and Irritable  Cognitive:  Appropriate  Insight: Improving  Engagement in Group:  Engaged  Modes of Intervention:  Discussion  Additional Comments: During group, patient expressed a good deal of anger about the mental health system, but was able to be empathetic with others.  He kept focusing on the fact that he has Asperger's and that is significantly affecting his reaction to the written schedule not being followed.  Lynnell ChadMareida J Grossman-Orr, LCSW 10/30/2016, 11:48 AM

## 2016-10-30 NOTE — [facility] Group Notes (Signed)
[facility] Group Notes:  Healthy Support Systems   Date:  10/30/2016  Time:  7:00 PM  Type of Therapy:  Psychoeducational Skills  Participation Level:  Active  Participation Quality:  Appropriate and Attentive  Affect:  Appropriate  Cognitive:  Appropriate  Insight:  Appropriate  Engagement in Group:  Engaged  Modes of Intervention:  Discussion, Education and Exploration  Summary of Progress/Problems: Patient attended group and was engaged.  Siddhanth Denk E 10/30/2016, 7:00 PM

## 2016-10-30 NOTE — Progress Notes (Signed)
D    Pt continues to complain about groups not starting on time and him no being included in same   He said because of that he had a bad day today   Otherwise he was pleasant and cooperative on approach   He is compliant with treatment   He has limited interaction with others A    Verbal support given   Medications administered and effectiveness monitored   Q 15 min checks R   Pt is safe at present

## 2016-10-30 NOTE — Progress Notes (Deleted)
D   Pt said he had a good day and he is feeling much better    He said he was able to get a maybe from the [facility] but he also knows somebody who may be able to help him get a bed    He did spend a lot of time on the phone and is doing his part to help himself A    Verbal support given   Medications administered and effectiveness monitored   Q 15 min checks    R  Pt receptive to verbal support

## 2016-10-30 NOTE — Progress Notes (Signed)
Pt attended wrap up group on the [address] hall.  

## 2016-10-31 MED ORDER — CLONIDINE HCL 0.1 MG PO TABS
0.0500 mg | ORAL_TABLET | Freq: Two times a day (BID) | ORAL | Status: DC
  Administered 2016-10-31 – 2016-11-01 (×2): 0.05 mg via ORAL
  Filled 2016-10-31 (×6): qty 0.5

## 2016-10-31 MED ORDER — PROPRANOLOL HCL 20 MG PO TABS
20.0000 mg | ORAL_TABLET | Freq: Two times a day (BID) | ORAL | Status: DC | PRN
  Administered 2016-11-01: 20 mg via ORAL
  Filled 2016-10-31: qty 2
  Filled 2016-10-31: qty 14

## 2016-10-31 NOTE — Progress Notes (Signed)
D: Patient became upset this morning when a group he was waiting for was cancelled on [address].  Patient was also upset over some medication changes over the weekend.  Patient requested that his propranolol be reordered on his medication regime.  Patient is able to express his feeling well and is able to calm himself after voicing his frustrations with staff.  Patient denies any thoughts of self harm.  He continues to program on the [address] and has an order to do so.  His is focused on discharge.  He rates his depression and hopelessness as an 8; anxiety as a 7. A: Continue to monitor medication management and MD orders.  Safety checks completed every 15 minutes per protocol.  Offer support and encouragement as needed. R: Patient is receptive to staff; his behavior is appropriate.

## 2016-10-31 NOTE — Progress Notes (Signed)
[facility] Group Notes:  (Nursing/MHT/Case Management/Adjunct)  Date:  10/31/2016  Time:  12:38 AM  Type of Therapy:  Psychoeducational Skills  Participation Level:  Active  Participation Quality:  Resistant  Affect:  Irritable  Cognitive:  Lacking  Insight:  Lacking  Engagement in Group:  Distracting  Modes of Intervention:  Education  Summary of Progress/Problems: The patient verbalized in group that he had a bad day since he spent quite a bit of time arguing with the staff. He stated that he has Asperger's Syndrome and that he felt that the staff should be more knowledgeable about the disorder. He was also upset about a change in his medication which he states the provider never informed of the change in regimen. He went on to say , that he was told by a doctor three years ago that he must take a certain medication for the rest of his life.   Hazle CocaGOODMAN, Lexine Jaspers S 10/31/2016, 12:38 AM

## 2016-10-31 NOTE — [facility] Group Notes (Signed)
[facility] LCSW Group Therapy  10/31/2016 1:15pm  Type of Therapy:  Group Therapy vercoming Obstacles  Participation Level:  Active  Participation Quality:  Appropriate   Affect:  Appropriate  Cognitive:  Appropriate and Oriented  Insight:  Developing/Improving and Improving  Engagement in Therapy:  Improving  Modes of Intervention:  Discussion, Exploration, Problem-solving and Support  Description of Group:   In this group patients will be encouraged to explore what they see as obstacles to their own wellness and recovery. They will be guided to discuss their thoughts, feelings, and behaviors related to these obstacles. The group will process together ways to cope with barriers, with attention given to specific choices patients can make. Each patient will be challenged to identify changes they are motivated to make in order to overcome their obstacles. This group will be process-oriented, with patients participating in exploration of their own experiences as well as giving and receiving support and challenge from other group members.  Summary of Patient Progress: Pt participated actively in group discussion and identified "breaking into the [facility] [employer]" as an obstacle. However, Pt was also able to identify more appropriately obstacles such as navigating the difficult task of drawing boundaries with a friend who is sometimes overbearing yet has good intentions. Pt was able to process the necessity of positive and consistent support but also described his need to have healthy boundaries. Pt offered encouragement and advice to other peers as well.    Therapeutic Modalities:   Cognitive Behavioral Therapy Solution Focused Therapy Motivational Interviewing Relapse Prevention Therapy   Vernie ShanksLauren Lessie Manigo, LCSW 10/31/2016 3:52 PM

## 2016-10-31 NOTE — Progress Notes (Signed)
Recreation Therapy Notes  Date: 10/31/16 Time: 0930 Location: [address] Dayroom  Group Topic: Stress Management  Goal Area(s) Addresses:  Patient will verbalize importance of using healthy stress management.  Patient will identify positive emotions associated with healthy stress management.   Intervention: Stress Management  Activity :  Peaceful Waves.  LRT introduced to the stress management technique of guided imagery.  LRT read a script to engage patients in the activity.  Patients were to follow along as LRT read script to participate in activity.  Education:  Stress Management, Discharge Planning.   Education Outcome: Acknowledges edcuation/In group clarification offered/Needs additional education  Clinical Observations/Feedback: Pt did not attend group.    Caroll RancherMarjette Magnus Crescenzo, LRT/CTRS      Caroll RancherLindsay, Shamecca Whitebread A 10/31/2016 12:17 PM

## 2016-10-31 NOTE — Plan of Care (Signed)
Problem: Education: Goal: Emotional status will improve Outcome: Progressing Patient is able to calm himself by discussing his frustrations with staff.

## 2016-10-31 NOTE — Progress Notes (Signed)
[facility]  10/31/2016 12:04 PM  Patient Active Problem List   Diagnosis Date Noted  . 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 Major depressive disorder, recurrent episode (HCC) 07/07/2015  . Anxiety disorder 01/27/2015  . Adjustment disorder with depressed mood 01/18/2015  . Acute encephalopathy 12/18/2013  . Delirium 12/18/2013  . 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 Abnormal brain CT 12/18/2013    Diagnosis: Depression and anxiety  Subjective: Patient reports he had somewhat of a difficult weekend dealing with schedule shifts secondary to his Asperger's syndrome. He had switched from Inderal to Seroquel when necessary's for anxiety but currently it appears that he would like to continue the propranolol and may be reduce one of his other antihypertensives. He was started on Klonopin over the weekend and states he wishes to continue it. He reports that he has not had trouble taking himself off with a self taper before and he anticipates doing this again once he is released. Patient feels he is making progress on dealing with his anxiety and depression with restarting his medications and denies current acute suicidal or homicidal ideation, plan or intent. He does not feel he is ready to be released today and the plan is currently for him to go probably Wednesday morning if current course continues. He has a couple of appointments to reschedule and social work was informed.  Objective: Well-developed well-nourished man in no apparent distress pleasant and appropriate mood is all right and affect is congruent speech and motor within normal limits thought processes linear and goal-directed thought content denies any suicidal or homicidal ideation, plan or intent, alert and oriented 3, IQ appears an average range insight and judgment are fair     Current Facility-Administered Medications (Cardiovascular):  .  cloNIDine (CATAPRES)  tablet 0.05 mg .  lisinopril (PRINIVIL,ZESTRIL) tablet 20 mg .  propranolol (INDERAL) tablet 20 mg   Current Facility-Administered Medications (Respiratory):  .  loratadine (CLARITIN) tablet 10 mg   Current Facility-Administered Medications (Analgesics):  .  acetaminophen (TYLENOL) tablet 650 mg     Current Facility-Administered Medications (Other):  .  alum & mag hydroxide-simeth (MAALOX/MYLANTA) 200-200-20 MG/5ML suspension 30 mL .  clonazePAM (KLONOPIN) tablet 0.5 mg .  FLUoxetine (PROZAC) capsule 40 mg .  gabapentin (NEURONTIN) capsule 800 mg .  gabapentin (NEURONTIN) capsule 800 mg .  hydrOXYzine (ATARAX/VISTARIL) tablet 25 mg .  magnesium hydroxide (MILK OF MAGNESIA) suspension 30 mL .  QUEtiapine (SEROQUEL) tablet 100 mg .  tamsulosin (FLOMAX) capsule 0.8 mg  No current outpatient prescriptions on file.  Vital Signs:Blood pressure 122/89, pulse 78, temperature 97.5 F (36.4 C), temperature source Oral, resp. rate 20, height 5\' 8"  (1.727 m), weight 78.5 kg (173 lb), SpO2 99 %.    Lab Results: No results found for this or any previous visit (from the past 48 hour(s)).  Physical Findings: AIMS: Facial and Oral Movements Muscles of Facial Expression: None, normal Lips and Perioral Area: None, normal Jaw: None, normal Tongue: None, normal,Extremity Movements Upper (arms, wrists, hands, fingers): None, normal Lower (legs, knees, ankles, toes): None, normal, Trunk Movements Neck, shoulders, hips: None, normal, Overall Severity Severity of abnormal movements (highest score from questions above): None, normal Incapacitation due to abnormal movements: None, normal Patient's awareness of abnormal movements (rate only patient's report): No Awareness, Dental Status Current problems with teeth and/or dentures?: No Does patient usually wear dentures?: No  CIWA:    COWS:      Assessment/Plan: patient appears  to be making progress with getting back on his medications and  dealing with his underlying anxiety and depression. He does find it difficult to deal with this environment secondary to his Asperger's at times but has worked hard to accommodate this. At this time we will lower the clonidine to 0.05 mg by mouth twice a day and return to propranolol when necessary and discontinue the Seroquel when necessary during the day. Patient does take a number of medications which are taken either to lower blood pressure are also have that effect. Currently his blood pressure has remained in a good range. We will continue to monitor. Asians HbA1c was 5.7, cholesterol panel showed unfavorable HDL otherwise unremarkable. These results will be communicated to the patient later. Plan is to continue to monitor patient's response to medication and safety issues and discharge Wednesday if present course continuous.   Acquanetta SitElizabeth Woods Michaela Broski, MD 10/31/2016, 12:04 PM

## 2016-11-01 MED ORDER — FLUOXETINE HCL 40 MG PO CAPS: 40.0000 mg | ORAL_CAPSULE | Freq: Every day | ORAL | 0 refills | Status: DC

## 2016-11-01 MED ORDER — TAMSULOSIN HCL 0.4 MG PO CAPS: 0.8000 mg | ORAL_CAPSULE | Freq: Every day | ORAL | 0 refills | Status: DC

## 2016-11-01 MED ORDER — GABAPENTIN 400 MG PO CAPS: 800.0000 mg | ORAL_CAPSULE | Freq: Two times a day (BID) | ORAL | 0 refills | Status: DC

## 2016-11-01 MED ORDER — CLONIDINE HCL 0.1 MG PO TABS
0.1000 mg | ORAL_TABLET | Freq: Two times a day (BID) | ORAL | Status: DC
  Filled 2016-11-01 (×2): qty 1

## 2016-11-01 MED ORDER — CLONIDINE HCL 0.1 MG PO TABS
0.0500 mg | ORAL_TABLET | Freq: Two times a day (BID) | ORAL | Status: DC
  Filled 2016-11-01 (×2): qty 0.5
  Filled 2016-11-01 (×2): qty 7

## 2016-11-01 MED ORDER — LISINOPRIL 20 MG PO TABS: 20.0000 mg | ORAL_TABLET | Freq: Every day | ORAL | 0 refills | Status: DC

## 2016-11-01 MED ORDER — CLONIDINE HCL 0.1 MG PO TABS: 0.0500 mg | ORAL_TABLET | Freq: Two times a day (BID) | ORAL | 0 refills | Status: DC

## 2016-11-01 MED ORDER — HYDROXYZINE HCL 25 MG PO TABS: 25.0000 mg | ORAL_TABLET | Freq: Three times a day (TID) | ORAL | 0 refills | Status: DC | PRN

## 2016-11-01 MED ORDER — CLONAZEPAM 0.5 MG PO TABS: 0.5000 mg | ORAL_TABLET | Freq: Every day | ORAL | 0 refills | Status: DC

## 2016-11-01 MED ORDER — PROPRANOLOL HCL 20 MG PO TABS: 20.0000 mg | ORAL_TABLET | Freq: Two times a day (BID) | ORAL | 0 refills | Status: DC | PRN

## 2016-11-01 MED ORDER — QUETIAPINE FUMARATE 100 MG PO TABS: 100.0000 mg | ORAL_TABLET | Freq: Every day | ORAL | 0 refills | Status: DC
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
## 2016-11-01 NOTE — Tx Team (Signed)
Interdisciplinary Treatment and Diagnostic Plan Update  11/01/2016 Time of Session: 9:30AM Zachary Russo MRN: [id number]  Principal Diagnosis: Major depressive disorder, recurrent episode [facility])  Secondary Diagnoses: Principal Problem:   Major depressive disorder, recurrent episode (HCC)   Current Medications:  Current Facility-Administered Medications  Medication Dose Route Frequency Provider Last Rate Last Dose  . acetaminophen (TYLENOL) tablet 650 mg  650 mg Oral Q6H PRN Acquanetta Sit, MD      . alum & mag hydroxide-simeth (MAALOX/MYLANTA) 200-200-20 MG/5ML suspension 30 mL  30 mL Oral Q4H PRN Acquanetta Sit, MD      . clonazePAM [facility]) tablet 0.5 mg  0.5 mg Oral QHS Beau Fanny, FNP   0.5 mg at 10/31/16 2045  . cloNIDine (CATAPRES) tablet 0.05 mg  0.05 mg Oral BID Acquanetta Sit, MD      . FLUoxetine (PROZAC) capsule 40 mg  40 mg Oral Daily Acquanetta Sit, MD   40 mg at 11/01/16 4098  . gabapentin (NEURONTIN) capsule 800 mg  800 mg Oral BH-q7a Acquanetta Sit, MD   800 mg at 11/01/16 1191  . gabapentin (NEURONTIN) capsule 800 mg  800 mg Oral QHS Acquanetta Sit, MD   800 mg at 10/31/16 2045  . hydrOXYzine (ATARAX/VISTARIL) tablet 25 mg  25 mg Oral TID PRN Acquanetta Sit, MD   25 mg at 10/30/16 1822  . lisinopril (PRINIVIL,ZESTRIL) tablet 20 mg  20 mg Oral Daily Acquanetta Sit, MD   20 mg at 11/01/16 4782  . loratadine (CLARITIN) tablet 10 mg  10 mg Oral BH-q7a Acquanetta Sit, MD   10 mg at 11/01/16 9562  . magnesium hydroxide (MILK OF MAGNESIA) suspension 30 mL  30 mL Oral Daily PRN Acquanetta Sit, MD      . propranolol (INDERAL) tablet 20 mg  20 mg Oral BID PRN Acquanetta Sit, MD   20 mg at 11/01/16 1207  . QUEtiapine (SEROQUEL) tablet 100 mg  100 mg Oral QHS Acquanetta Sit, MD   100 mg at 10/31/16 2045  . tamsulosin (FLOMAX) capsule 0.8 mg  0.8 mg Oral Daily Beau Fanny, FNP   0.8 mg at  11/01/16 1308   PTA Medications: Prescriptions Prior to Admission  Medication Sig Dispense Refill Last Dose  . amLODipine (NORVASC) 10 MG tablet Take 1 tablet (10 mg total) by mouth daily before supper. (Patient taking differently: Take 5 mg by mouth daily before supper. ) 30 tablet 0 Past Month at Unknown time  . amLODipine (NORVASC) 5 MG tablet Take 5 mg by mouth daily.   Past Month at Unknown time  . azithromycin (ZITHROMAX Z-PAK) 250 MG tablet 2 po day one, then 1 daily x 4 days (Patient not taking: Reported on 10/26/2016) 6 tablet 0 Not Taking at Unknown time  . cetirizine (ZYRTEC) 10 MG tablet Take 10 mg by mouth daily.   Past Month at Unknown time  . ciprofloxacin (CIPRO) 500 MG tablet Take 1 tablet (500 mg total) by mouth 2 (two) times daily. (Patient not taking: Reported on 10/26/2016) 14 tablet 0 Not Taking at Unknown time  . cloNIDine (CATAPRES) 0.1 MG tablet Take 1 tablet (0.1 mg total) by mouth at bedtime. (Patient taking differently: Take 0.2 mg by mouth at bedtime. ) 30 tablet 0   . cloNIDine (CATAPRES) 0.2 MG tablet Take 0.2 mg by mouth daily.   Past Month at Unknown time  . diazepam (VALIUM) 5 MG tablet Take 1 tablet (  5 mg total) by mouth daily as needed for muscle spasms. (Patient not taking: Reported on 10/26/2016) 10 tablet 0 Not Taking at Unknown time  . FLUoxetine (PROZAC) 40 MG capsule Take 1 capsule (40 mg total) by mouth daily. 30 capsule 0 Past Month at Unknown time  . gabapentin (NEURONTIN) 400 MG capsule Take 1-2 capsules (400-800 mg total) by mouth 2 (two) times daily. 400 mg in AM and 800 mg in PM 60 capsule 0 Past Month at Unknown time  . lisinopril (PRINIVIL,ZESTRIL) 20 MG tablet Take 1 tablet (20 mg total) by mouth daily. 30 tablet 0 Past Month at Unknown time  . loratadine (CLARITIN) 10 MG tablet Take 1 tablet (10 mg total) by mouth daily. One po daily x 5 days (Patient not taking: Reported on 10/26/2016) 14 tablet 0 Not Taking at Unknown time  . montelukast (SINGULAIR)  10 MG tablet Take 10 mg by mouth at bedtime.   Past Month at Unknown time  . promethazine (PHENERGAN) 25 MG tablet Take 1 tablet (25 mg total) by mouth every 6 (six) hours as needed for nausea. (Patient not taking: Reported on 10/26/2016) 20 tablet 0 Not Taking at Unknown time  . propranolol (INDERAL) 20 MG tablet Take 1 tablet (20 mg total) by mouth daily. 30 tablet 0 Past Month at Unknown time  . QUEtiapine (SEROQUEL) 100 MG tablet Take 100 mg by mouth at bedtime.   Past Month at Unknown time  . tamsulosin (FLOMAX) 0.4 MG CAPS capsule Take 2 capsules (0.8 mg total) by mouth daily. 60 capsule 0 Past Month at Unknown time    Patient Stressors: Financial difficulties Health problems Medication change or noncompliance  Patient Strengths: Active sense of humor Capable of independent living Communication skills General fund of knowledge Motivation for treatment/growth  Treatment Modalities: Medication Management, Group therapy, Case management,  1 to 1 session with clinician, Psychoeducation, Recreational therapy.   Physician Treatment Plan for Primary Diagnosis: Major depressive disorder, recurrent episode (HCC) Long Term Goal(s): Improvement in symptoms so as ready for discharge Improvement in symptoms so as ready for discharge   Short Term Goals: Ability to disclose and discuss suicidal ideas Ability to demonstrate self-control will improve Ability to identify changes in lifestyle to reduce recurrence of condition will improve Ability to identify and develop effective coping behaviors will improve  Medication Management: Evaluate patient's response, side effects, and tolerance of medication regimen.  Therapeutic Interventions: 1 to 1 sessions, Unit Group sessions and Medication administration.  Evaluation of Outcomes: Adequate for discharge  Physician Treatment Plan for Secondary Diagnosis: Principal Problem:   Major depressive disorder, recurrent episode (HCC)  Long Term  Goal(s): Improvement in symptoms so as ready for discharge Improvement in symptoms so as ready for discharge   Short Term Goals: Ability to disclose and discuss suicidal ideas Ability to demonstrate self-control will improve Ability to identify changes in lifestyle to reduce recurrence of condition will improve Ability to identify and develop effective coping behaviors will improve     Medication Management: Evaluate patient's response, side effects, and tolerance of medication regimen.  Therapeutic Interventions: 1 to 1 sessions, Unit Group sessions and Medication administration.  Evaluation of Outcomes: Adequate for discharge   RN Treatment Plan for Primary Diagnosis: Major depressive disorder, recurrent episode (HCC) Long Term Goal(s): Knowledge of disease and therapeutic regimen to maintain health will improve  Short Term Goals: Ability to remain free from injury will improve, Ability to disclose and discuss suicidal ideas and Ability to identify and  develop effective coping behaviors will improve  Medication Management: RN will administer medications as ordered by provider, will assess and evaluate patient's response and provide education to patient for prescribed medication. RN will report any adverse and/or side effects to prescribing provider.  Therapeutic Interventions: 1 on 1 counseling sessions, Psychoeducation, Medication administration, Evaluate responses to treatment, Monitor vital signs and CBGs as ordered, Perform/monitor CIWA, COWS, AIMS and Fall Risk screenings as ordered, Perform wound care treatments as ordered.  Evaluation of Outcomes: Adequate for discharge   LCSW Treatment Plan for Primary Diagnosis: Major depressive disorder, recurrent episode (HCC) Long Term Goal(s): Safe transition to appropriate next level of care at discharge, Engage patient in therapeutic group addressing interpersonal concerns.  Short Term Goals: Engage patient in aftercare planning with  referrals and resources, Facilitate acceptance of mental health diagnosis and concerns, Facilitate patient progression through stages of change regarding substance use diagnoses and concerns and Identify triggers associated with mental health/substance abuse issues  Therapeutic Interventions: Assess for all discharge needs, 1 to 1 time with Social worker, Explore available resources and support systems, Assess for adequacy in community support network, Educate family and significant other(s) on suicide prevention, Complete Psychosocial Assessment, Interpersonal group therapy.  Evaluation of Outcomes: Adequate for discharge.    Progress in Treatment: Attending groups: Yes Participating in groups: Yes Taking medication as prescribed: Yes. Toleration medication: Yes. Family/Significant other contact made: SPE completed with pt; pt declined to consent to family contact.  Patient understands diagnosis: Yes. Discussing patient identified problems/goals with staff: Yes. Medical problems stabilized or resolved: Yes. Denies suicidal/homicidal ideation: Yes. Issues/concerns per patient self-inventory: No. Other: n/a  New problem(s) identified: No, Describe:  n/a  New Short Term/Long Term Goal(s): develop effective aftercare plan; medication stabilization.   Discharge Plan or Barriers: [facility] for [facility].    Reason for Continuation of Hospitalization: none   Estimated Length of Stay: d/c today   Attendees: Patient: 11/01/2016 3:18 PM  Physician: Dr. Vallery RidgeElizabeth Oates MD 11/01/2016 3:18 PM  Nursing: Dorthula PerfectCaroline, Marian, Beverly RN 11/01/2016 3:18 PM  RN Care Manager: Onnie BoerJennifer Clark CM 11/01/2016 3:18 PM  Social Worker: Trula SladeHeather Smart, LCSW 11/01/2016 3:18 PM  Recreational Therapist:  11/01/2016 3:18 PM  Other: Claudette Headonrad Withrow NP; Gray BernhardtMay Augustin NP 11/01/2016 3:18 PM  Other:  11/01/2016 3:18 PM   Other: 11/01/2016 3:18 PM    Scribe for Treatment Team: Ledell PeoplesHeather N Smart, LCSW 11/01/2016 3:18 PM

## 2016-11-01 NOTE — Progress Notes (Signed)
D: Patient has been irritable throughout the day.  He was pleasant at the beginning of the day.  Patient went over to 400 for Dog Therapy and was asked to return to 300.  Patient gets very frustrated when this happens and expressed his displeasure.  Patient became irate and demanded to be discharged.  Discussed plan of care with Dr. Mckinley Jewelates who agreed that he could be discharge today instead of tomorrow.  Patient denies any thoughts of self harm.  He has a bus ticket for transportation home.  He is receiving prescriptions and samples of his medications.   A: Initiate discharge paperwork and review with patient.  Continue 15 minute safety checks per protocol.   R: Patient is agreeable to discharge today and will leave this afternoon.

## 2016-11-01 NOTE — Progress Notes (Signed)
Zachary Russo had been up and visible in milieu, attended and participated in group activity. Zachary Russo was seen interacting appropriately with peers, spoke about how he may be discharged on Wednesday and spoke about how he is feeling better. He reports sleeping ok, reports no complaints of pain this evening and did receive all bedtime medications without incident. A. Support and encouragement provided. R. Safety maintained, will continue to monitor.

## 2016-11-01 NOTE — Progress Notes (Signed)
Discharge note:  Patient discharged home per MD order.  Patient received all personal belongings from unit and locker.  Patient denies all thoughts of self harm.  Reviewed AVS/transition record with patient and he indicated understanding.  Patient left with prescriptions and samples of his medications.  Patient left ambulatory with a bus pass.  He was given a Biomedical engineersurvey.

## 2016-11-01 NOTE — Discharge Summary (Signed)
Physician Discharge Summary Note  Patient:  Zachary Russo is an 55 y.o., male MRN:  [id number] DOB:  12-06-1961 Patient phone:  [phone] (home)  Patient address:   [address] [place] [address],  Total Time spent with patient: 45 minutes  Date of Admission:  10/27/2016 Date of Discharge: 11/01/16  Reason for Admission:   Patient reports he first experienced symptoms that might be considered mental health symptoms around 1992 when he began experiencing severe insomnia. He reports that he first feels that depression became a problem starting around 2014 or 2015. He identifies 2 significant events then, one is that he had a stroke over the New Year's 5359f 2014- 2015 and that his mother was "seized by social services." The patient reports "they turned the hearing into a referendum on me" and he feels he was improperly accused of not being able to care for his mother and she was removed from his care which she feels went badly for her. 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 He reports that he has filed a complaint with the managing committee of the [employer] over this. He states he feels his outpatient therapist was not sympathetic stating that he could still play on the street. A more significant problem is that [facility] and has found he is not able to negotiate arranging his care on his own. He states he is not been able to get mental and physical health appointment set up in a timely fashion or obtain his meds as needed. At present he does have a place to live an apartment to which she can return. He is currently on Social Security disability.  The patient reports that he doesn't feel like he has traditional PTSD symptoms but he is  still bothered by his upbringing. He states his adoptive father was an abusive alcoholic who at one point stated that if he had known he was going to become a musician he would've taken him back to the agency and exchanged him. The patient describes his mood as "not good, not hopeful" and that his concentration may be down. He states he feels like he is not currently planning to harm anybody or himself but that if he continued to be without his Russo-up care his condition would deteriorate. No reports of any manic symptoms no reports of any psychotic symptoms. The patient reports that he does occasionally smoke marijuana but denies other drug use. Patient presents with a number of active prescriptions listed in our records. After discussion of what he is and isn't taking and what's helpful he will currently be continued on Prozac 40 mg daily Seroquel 100 mg by mouth daily at bedtime as well as lisinopril, clonidine, Claritin, Flomax and gabapentin.  Principal Problem: Major depressive disorder, recurrent episode [facility](HCC) Discharge Diagnoses: Patient Active Problem List   Diagnosis Date Noted  . MDD (major depressive disorder), recurrent severe, without psychosis (HCC) [F33.2] 07/19/2015    Priority: High  . Major depressive disorder, recurrent episode (HCC) [F33.9] 07/07/2015  . Anxiety disorder [F41.9] 01/27/2015  . Adjustment disorder with depressed mood [F43.21] 01/18/2015  . Acute encephalopathy [G93.40] 12/18/2013  . Delirium [R41.0] 12/18/2013  . 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  Abnormal brain CT [R90.89] 12/18/2013    Past Psychiatric History: See H&P  Past Medical History:  Past Medical History:  Diagnosis Date  . Anxiety   . Asperger syndrome   . Depression   . H/O suicide attempt    overdose "many years ago"  . Homelessness   . Hypertension   . Prostate disorder   . Restless leg syndrome    History reviewed. No pertinent surgical history. Family History: History  reviewed. No pertinent family history. Family Psychiatric  History: See H&P Social History:  History  Alcohol Use No     History  Drug Use  . Types: Marijuana    Comment: Pt reports last use was 2-3 weeks ago    Social History   Social History  . Marital status: Single    Spouse name: N/A  . Number of children: N/A  . Years of education: N/A   Social History Main Topics  . Smoking status: Light Tobacco Smoker    Years: 2.00    Types: Cigars  . Smokeless tobacco: Never Used  . Alcohol use No  . Drug use:     Types: Marijuana     Comment: Pt reports last use was 2-3 weeks ago  . Sexual activity: Not Currently   Other Topics Concern  . None   Social History Narrative  . None    Hospital Course:   Zachary ButterKevin Mallinger was admitted for Major depressive disorder, recurrent episode (HCC) , with psychosis and crisis management.  Pt was treated discharged with the medications listed below under Medication List.  Medical problems were identified and treated as needed.  Home medications were restarted as appropriate.  Improvement was monitored by observation and Zachary Russo 's daily report of symptom reduction.  Emotional and mental status was monitored by daily self-inventory reports completed by Zachary Russo and clinical staff.         Zachary ButterKevin Danek was evaluated by the treatment team for stability and plans for continued recovery upon discharge. Zachary Russo 's motivation was an integral factor for scheduling further treatment. Employment, transportation, bed availability, health status, family support, and any pending legal issues were also considered during hospital stay. Pt was offered further treatment options upon discharge including but not limited to Residential, Intensive Outpatient, and Outpatient treatment.  Zachary Russo will Russo up with the services as listed below under Russo Up Information.     Upon completion of this admission the patient was both mentally and medically stable  for discharge denying suicidal/homicidal ideation, auditory/visual/tactile hallucinations, delusional thoughts and paranoia.    Zachary Russo responded well to treatment with Seroquel, neurontin, vistaril, clonidine, prozac, inderal without adverse effects. Pt demonstrated improvement without reported or observed adverse effects to the point of stability appropriate for outpatient management. Pertinent labs include: LDL 130, HDL 31, A1C 5.7, UDS+ THC, for which outpatient Russo-up is necessary for lab recheck as mentioned below. Reviewed CBC, CMP, BAL, and UDS; all unremarkable aside from noted exceptions.   Physical Findings: AIMS: Facial and Oral Movements Muscles of Facial Expression: None, normal Lips and Perioral Area: None, normal Jaw: None, normal Tongue: None, normal,Extremity Movements Upper (arms, wrists, hands, fingers): None, normal Lower (legs, knees, ankles, toes): None, normal, Trunk Movements Neck, shoulders, hips: None, normal, Overall Severity Severity of abnormal movements (highest score from questions above): None, normal Incapacitation due to abnormal movements: None, normal Patient's awareness of abnormal movements (rate only patient's report): No Awareness, Dental Status Current problems with teeth and/or dentures?:  No Does patient usually wear dentures?: No  CIWA:    COWS:     Musculoskeletal: Strength & Muscle Tone: within normal limits Gait & Station: normal Patient leans: N/A  Psychiatric Specialty Exam: Physical Exam  Review of Systems  Psychiatric/Behavioral: Positive for depression. Negative for hallucinations, substance abuse and suicidal ideas. The patient is nervous/anxious and has insomnia.   All other systems reviewed and are negative.   Blood pressure 135/90, pulse 84, temperature 97.5 F (36.4 C), temperature source Oral, resp. rate 16, height 5\' 8"  (1.727 m), weight 78.5 kg (173 lb), SpO2 99 %.Body mass index is 26.3 kg/m.  SEE MD PSE WITHIN  SRA  Have you used any form of tobacco in the last 30 days? (Cigarettes, Smokeless Tobacco, Cigars, and/or Pipes): Yes  Has this patient used any form of tobacco in the last 30 days? (Cigarettes, Smokeless Tobacco, Cigars, and/or Pipes) No  Blood Alcohol level:  Lab Results  Component Value Date   ETH <5 10/26/2016   ETH <5 09/01/2015    Metabolic Disorder Labs:  Lab Results  Component Value Date   HGBA1C 5.7 (H) 10/28/2016   MPG 117 10/28/2016   MPG 131 07/09/2015   No results found for: PROLACTIN Lab Results  Component Value Date   CHOL 179 10/28/2016   TRIG 92 10/28/2016   HDL 31 (L) 10/28/2016   CHOLHDL 5.8 10/28/2016   VLDL 18 10/28/2016   LDLCALC 130 (H) 10/28/2016   LDLCALC 124 (H) 07/09/2015    See Psychiatric Specialty Exam and Suicide Risk Assessment completed by Attending Physician prior to discharge.  Discharge destination:  Home  Is patient on multiple antipsychotic therapies at discharge:  No   Has Patient had three or more failed trials of antipsychotic monotherapy by history:  No  Recommended Plan for Multiple Antipsychotic Therapies: NA     Medication List    STOP taking these medications   amLODipine 10 MG tablet Commonly known as:  NORVASC   amLODipine 5 MG tablet Commonly known as:  NORVASC   azithromycin 250 MG tablet Commonly known as:  ZITHROMAX Z-PAK   cetirizine 10 MG tablet Commonly known as:  ZYRTEC   ciprofloxacin 500 MG tablet Commonly known as:  CIPRO   diazepam 5 MG tablet Commonly known as:  VALIUM   montelukast 10 MG tablet Commonly known as:  SINGULAIR   promethazine 25 MG tablet Commonly known as:  PHENERGAN     TAKE these medications     Indication  clonazePAM 0.5 MG tablet Commonly known as:  KLONOPIN Take 1 tablet (0.5 mg total) by mouth at bedtime.  Indication:  insomnia   cloNIDine 0.1 MG tablet Commonly known as:  CATAPRES Take 0.5 tablets (0.05 mg total) by mouth 2 (two) times daily. What  changed:  how much to take  when to take this  Another medication with the same name was removed. Continue taking this medication, and Russo the directions you see here.  Indication:  mood stabilization   FLUoxetine 40 MG capsule Commonly known as:  PROZAC Take 1 capsule (40 mg total) by mouth daily. Start taking on:  11/02/2016  Indication:  Depression   gabapentin 400 MG capsule Commonly known as:  NEURONTIN Take 2 capsules (800 mg total) by mouth 2 (two) times daily at 8 am and 10 pm. What changed:  how much to take  when to take this  additional instructions  Indication:  anxiety   hydrOXYzine 25 MG tablet Commonly known as:  ATARAX/VISTARIL Take 1 tablet (25 mg total) by mouth 3 (three) times daily as needed for anxiety.  Indication:  Anxiety Neurosis   lisinopril 20 MG tablet Commonly known as:  PRINIVIL,ZESTRIL Take 1 tablet (20 mg total) by mouth daily. Start taking on:  11/02/2016  Indication:  High Blood Pressure Disorder   loratadine 10 MG tablet Commonly known as:  CLARITIN Take 1 tablet (10 mg total) by mouth daily. One po daily x 5 days  Indication:  Hayfever   propranolol 20 MG tablet Commonly known as:  INDERAL Take 1 tablet (20 mg total) by mouth 2 (two) times daily as needed (anxiety). What changed:  when to take this  reasons to take this  Indication:  mood stabilization   QUEtiapine 100 MG tablet Commonly known as:  SEROQUEL Take 1 tablet (100 mg total) by mouth at bedtime.  Indication:  obsessive thoughts   tamsulosin 0.4 MG Caps capsule Commonly known as:  FLOMAX Take 2 capsules (0.8 mg total) by mouth daily. Start taking on:  11/02/2016  Indication:  Benign Enlargement of Prostate      Russo-up Information    [facility] Russo up.   Specialty:  Behavioral Health Why:  Walk in between 8am-9am Monday through Friday for hospital Russo-up/medication management. Please bring photo ID/Medicaid card if you have it. Please try to go  for assessment within 2-3 days of hospital discharge. Thank you.  Contact information[address] [place] [address] [phone]        [facility] Russo up on 11/09/2016.   Why:  Appt on this date at 12:30PM for counseling with Elina. Please bring photo ID/Medicaid card if you have one. Thank you.  Contact information: [facility] [address] [place] [address] Phone: [phone] Fax: [phone]       [facility] Russo up.   Why:  No appts available at this time. Please call office Wed AM after 9:00AM to schedule new patient appt. Thank you.  Contact information: [address] [place], [place] [address] Phone: [phone] Fax: [phone]          Russo-up recommendations:  Activity:  As tolerated Diet:  Heart healthy with low sodium.  Comments:   Take all medications as prescribed. Keep all Russo-up appointments as scheduled.  Do not consume alcohol or use illegal drugs while on prescription medications. Report any adverse effects from your medications to your primary care provider promptly.  In the event of recurrent symptoms or worsening symptoms, call 911, a crisis hotline, or go to the nearest emergency department for evaluation.   Signed: Beau Fanny, FNP 11/01/2016, 4:22 PM

## 2016-11-01 NOTE — BHH Group Notes (Signed)
The focus of this group is to educate the patient on the purpose and policies of crisis stabilization and provide a format to answer questions about their admission.  The group details unit policies and expectations of patients while admitted.  Patient did not attend 0900 nurse education orientation group this morning.  Patient stayed in bed.   

## 2016-11-01 NOTE — Progress Notes (Signed)
  [facility] :  Will you be returning to the same living situation after discharge:  Yes,  home At discharge, do you have transportation home?: Yes,  bus pass Do you have the ability to pay for your medications: Yes,  mental health/medicaid  Release of information consent forms completed and submitted to medical records by CSW.  Patient to Follow up at: Follow-up Information    [facility] Follow up.   Specialty:  Behavioral Health Why:  Walk in between 8am-9am Monday through Friday for hospital follow-up/medication management. Please bring photo ID/Medicaid card if you have it. Please try to go for assessment within 2-3 days of hospital discharge. Thank you.  Contact informationElpidio Eric: [address] [place] [address] [phone]        [facility] Follow up on 11/09/2016.   Why:  Appt on this date at 12:30PM for counseling with Elina. Please bring photo ID/Medicaid card if you have one. Thank you.  Contact information: [facility] [address] [place], [place] [address] Phone: [phone] Fax: [phone]          Next level of care provider has access to [facility] Link:no  Safety Planning and Suicide Prevention discussed: Yes,  SPE completed with pt; pt declined to consent to family contact. SPI pamphlet and Mobile Crisis information provided to pt and he was encouraged to share information with support network, ask questions, and talk about any concerns relating to SPE.  Have you used any form of tobacco in the last 30 days? (Cigarettes, Smokeless Tobacco, Cigars, and/or Pipes): Yes  Has patient been referred to the Quitline?: Patient refused referral  Patient has been referred for addiction treatment: Yes  Jailey Booton N Smart LCSW 11/01/2016, 3:16 PM

## 2016-11-01 NOTE — BHH Suicide Risk Assessment (Signed)
[facility]   Principal Problem: Major depressive disorder, recurrent episode [facility](HCC) Discharge Diagnoses:  Patient Active Problem List   Diagnosis Date Noted  . MDD (major depressive disorder), recurrent severe, without psychosis (HCC) [F33.2] 07/19/2015  . Major depressive disorder, recurrent episode (HCC) [F33.9] 07/07/2015  . Anxiety disorder [F41.9] 01/27/2015  . Adjustment disorder with depressed mood [F43.21] 01/18/2015  . Acute encephalopathy [G93.40] 12/18/2013  . Delirium [R41.0] 12/18/2013  . 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 Abnormal brain CT [R90.89] 12/18/2013    Total Time spent with patient: 15 minutes  Musculoskeletal: Strength & Muscle Tone: within normal limits Gait & Station: normal Patient leans: Backward  Psychiatric Specialty Exam: ROS  Blood pressure 135/90, pulse 84, temperature 97.5 F (36.4 C), temperature source Oral, resp. rate 16, height 5\' 8"  (1.727 m), weight 78.5 kg (173 lb), SpO2 99 %.Body mass index is 26.3 kg/m.  General Appearance: Casual  Eye Contact::  Good  Speech:  Clear and Coherent409  Volume:  Normal  Mood:  Anxious  Affect:  Appropriate  Thought Process:  Coherent  Orientation:  Full (Time, Place, and Person)  Thought Content:  Negative  Suicidal Thoughts:  No  Homicidal Thoughts:  No  Memory:  Negative  Judgement:  Fair  Insight:  Fair  Psychomotor Activity:  Normal  Concentration:  Good  Recall:  Good  Fund of Knowledge:Good  Language: Good  Akathisia:  No  Handed:  Right  AIMS (if indicated):     Assets:  Resilience  Sleep:  Number of Hours: 6.75  Cognition: WNL  ADL's:  Intact   Mental Status Per Nursing Assessment::   On Admission:  Self-harm thoughts  Demographic Factors:  Male, Caucasian and Low socioeconomic status  Loss Factors: Decrease in vocational status  Historical Factors: NA  Risk Reduction Factors:   Positive coping skills or problem solving  skills  Continued Clinical Symptoms:  Medical Diagnoses and Treatments/Surgeries  Cognitive Features That Contribute To Risk:  None    Suicide Risk:  Mild:  Suicidal ideation of limited frequency, intensity, duration, and specificity.  There are no identifiable plans, no associated intent, mild dysphoria and related symptoms, good self-control (both objective and subjective assessment), few other risk factors, and identifiable protective factors, including available and accessible social support.  Follow-up Information    [facility] Follow up on 11/22/2016.   Contact information: [address] [place], [place] [address] Phone: [phone] Fax: [phone]       [facility]   Specialty:  Behavioral Health Why:  Walk in between 8am-9am Monday through Friday for hospital follow-up/medication management. Please bring photo ID/Medicaid card if you have it. Please try to go for assessment within 2-3 days of hospital discharge. Thank you.  Contact informationElpidio Eric: [address] [place] [address] [phone]        [facility] Follow up on 11/14/2016.   Why:  Appt on this date at  Contact information: [facility] [address] [place], [place] [address] Phone: [phone] Fax: [phone]          Plan Of Care/Follow-up recommendations:  Other:  Patient denies suicidal or homicidal ideation, plan or intent at time of discharge. He will be following up with his outpatient providers and is encouraged to continue follow-up with both these and his social supports.  Acquanetta SitElizabeth Woods Oates, MD 11/01/2016, 2:51 PM

## 2016-11-01 NOTE — BHH Group Notes (Signed)
Pt attended spiritual care group on grief and loss facilitated by chaplain Idriss Quackenbush   Group opened with brief discussion and psycho-social ed around grief and loss in relationships and in relation to self - identifying life patterns, circumstances, changes that cause losses. Established group norm of speaking from own life experience. Group goal of establishing open and affirming space for members to share loss and experience with grief, normalize grief experience and provide psycho social education and grief support.     

## 2017-01-23 ENCOUNTER — Encounter (HOSPITAL_COMMUNITY): Payer: Self-pay

## 2017-01-23 DIAGNOSIS — R112 Nausea with vomiting, unspecified: Secondary | ICD-10-CM | POA: Diagnosis not present

## 2017-01-23 DIAGNOSIS — Z87891 Personal history of nicotine dependence: Secondary | ICD-10-CM | POA: Insufficient documentation

## 2017-01-23 DIAGNOSIS — I10 Essential (primary) hypertension: Secondary | ICD-10-CM | POA: Insufficient documentation

## 2017-01-23 LAB — COMPREHENSIVE METABOLIC PANEL
ALBUMIN: 4.4 g/dL (ref 3.5–5.0)
ALT: 39 U/L (ref 17–63)
ANION GAP: 12 (ref 5–15)
AST: 28 U/L (ref 15–41)
Alkaline Phosphatase: 96 U/L (ref 38–126)
BILIRUBIN TOTAL: 0.6 mg/dL (ref 0.3–1.2)
BUN: 9 mg/dL (ref 6–20)
CO2: 25 mmol/L (ref 22–32)
Calcium: 9.1 mg/dL (ref 8.9–10.3)
Chloride: 98 mmol/L — ABNORMAL LOW (ref 101–111)
Creatinine, Ser: 1.09 mg/dL (ref 0.61–1.24)
GFR calc non Af Amer: >60 mL/min (ref >60)
GLUCOSE: 193 mg/dL — AB (ref 65–99)
POTASSIUM: 3.5 mmol/L (ref 3.5–5.1)
SODIUM: 135 mmol/L (ref 135–145)
TOTAL PROTEIN: 7.3 g/dL (ref 6.5–8.1)

## 2017-01-23 LAB — CBC
HEMATOCRIT: 44.2 % (ref 39.0–52.0)
HEMOGLOBIN: 15 g/dL (ref 13.0–17.0)
MCH: 30.5 pg (ref 26.0–34.0)
MCHC: 33.9 g/dL (ref 30.0–36.0)
MCV: 89.8 fL (ref 78.0–100.0)
Platelets: 264 10*3/uL (ref 150–400)
RBC: 4.92 MIL/uL (ref 4.22–5.81)
RDW: 13.9 % (ref 11.5–15.5)
WBC: 12.3 10*3/uL — ABNORMAL HIGH (ref 4.0–10.5)

## 2017-01-23 LAB — LIPASE, BLOOD: Lipase: 32 U/L (ref 11–51)

## 2017-01-23 MED ORDER — ONDANSETRON 4 MG PO TBDP
4.0000 mg | ORAL_TABLET | Freq: Once | ORAL | Status: AC | PRN
  Administered 2017-01-23: 4 mg via ORAL

## 2017-01-23 MED ORDER — ONDANSETRON 4 MG PO TBDP
ORAL_TABLET | ORAL | Status: AC
  Filled 2017-01-23: qty 1

## 2017-01-23 NOTE — ED Triage Notes (Signed)
Pt endorses vomiting that began 5 hours pta, pt states "I think I ate a bad steak that I got from [employer]" Pt denies abdominal pain, diarrhea. Denies nausea at this time. VS 170/100(out of BP meds for a week, gets meds refilled next week), Pulse 100, RR 18. Pt ambulatory.

## 2017-01-23 NOTE — ED Triage Notes (Signed)
Pt tachycardic in triage 125 bpm.

## 2017-01-24 ENCOUNTER — Emergency Department (HOSPITAL_COMMUNITY)
Admission: EM | Admit: 2017-01-24 | Discharge: 2017-01-24 | Disposition: A | Discharge status: Home / Self Care | Payer: Medicaid Other | Attending: Emergency Medicine | Admitting: Emergency Medicine

## 2017-01-24 DIAGNOSIS — R112 Nausea with vomiting, unspecified: Secondary | ICD-10-CM

## 2017-01-24 DIAGNOSIS — I10 Essential (primary) hypertension: Secondary | ICD-10-CM

## 2017-01-24 LAB — URINALYSIS, ROUTINE W REFLEX MICROSCOPIC
BILIRUBIN URINE: NEGATIVE
GLUCOSE, UA: NEGATIVE mg/dL
HGB URINE DIPSTICK: NEGATIVE
Ketones, ur: 20 mg/dL — AB
LEUKOCYTES UA: NEGATIVE
NITRITE: NEGATIVE
PH: 6 (ref 5.0–8.0)
Protein, ur: 30 mg/dL — AB
SPECIFIC GRAVITY, URINE: 1.019 (ref 1.005–1.030)

## 2017-01-24 MED ORDER — CLONIDINE HCL 0.1 MG PO TABS
0.0500 mg | ORAL_TABLET | Freq: Once | ORAL | Status: AC
  Administered 2017-01-24: 0.05 mg via ORAL
  Filled 2017-01-24: qty 1

## 2017-01-24 MED ORDER — GABAPENTIN 400 MG PO CAPS: 800.0000 mg | ORAL_CAPSULE | Freq: Two times a day (BID) | ORAL | 0 refills | Status: DC

## 2017-01-24 MED ORDER — ONDANSETRON 4 MG PO TBDP: 4.0000 mg | ORAL_TABLET | Freq: Three times a day (TID) | ORAL | 0 refills | Status: DC | PRN

## 2017-01-24 MED ORDER — LISINOPRIL 20 MG PO TABS
20.0000 mg | ORAL_TABLET | Freq: Once | ORAL | Status: AC
Start: 2017-01-24 — End: 2017-01-24
  Administered 2017-01-24: 20 mg via ORAL
  Filled 2017-01-24: qty 1

## 2017-01-24 MED ORDER — PROPRANOLOL HCL 20 MG PO TABS: 20.0000 mg | ORAL_TABLET | Freq: Two times a day (BID) | ORAL | 0 refills | Status: DC | PRN

## 2017-01-24 MED ORDER — LISINOPRIL 20 MG PO TABS: 20.0000 mg | ORAL_TABLET | Freq: Every day | ORAL | 0 refills | Status: DC

## 2017-01-24 MED ORDER — PROPRANOLOL HCL 20 MG PO TABS
20.0000 mg | ORAL_TABLET | Freq: Once | ORAL | Status: AC
  Administered 2017-01-24: 20 mg via ORAL
  Filled 2017-01-24 (×2): qty 1

## 2017-01-24 MED ORDER — TAMSULOSIN HCL 0.4 MG PO CAPS: 0.8000 mg | ORAL_CAPSULE | Freq: Every day | ORAL | 0 refills | Status: DC

## 2017-01-24 MED ORDER — HYDROXYZINE HCL 25 MG PO TABS: 25.0000 mg | ORAL_TABLET | Freq: Three times a day (TID) | ORAL | 0 refills | Status: DC | PRN

## 2017-01-24 MED ORDER — ONDANSETRON 4 MG PO TBDP
4.0000 mg | ORAL_TABLET | Freq: Once | ORAL | Status: AC
  Administered 2017-01-24: 4 mg via ORAL
  Filled 2017-01-24: qty 1

## 2017-01-24 MED ORDER — CLONIDINE HCL 0.1 MG PO TABS: 0.0500 mg | ORAL_TABLET | Freq: Two times a day (BID) | ORAL | 0 refills | Status: DC

## 2017-01-24 MED ORDER — QUETIAPINE FUMARATE 100 MG PO TABS: 100.0000 mg | ORAL_TABLET | Freq: Every day | ORAL | 0 refills | Status: DC

## 2017-01-24 MED ORDER — FLUOXETINE HCL 40 MG PO CAPS: 40.0000 mg | ORAL_CAPSULE | Freq: Every day | ORAL | 0 refills | Status: DC

## 2017-01-24 MED ORDER — CLONAZEPAM 0.5 MG PO TABS: 0.5000 mg | ORAL_TABLET | Freq: Every day | ORAL | 0 refills | Status: AC

## 2017-01-24 NOTE — ED Provider Notes (Signed)
TIME SEEN: 4:30 AM  CHIEF COMPLAINT: Nausea, vomiting  HPI: Pt is a 56 y.o. male with history of hypertension, asperger syndrome who presents to the emergency department nausea and vomiting that started yesterday. States that he thinks he ate bad food from all her heart. No abdominal pain, diarrhea. He is also hypertensive and tachycardic reports he has been out of his blood pressure medication and all of his other medications for the past week. States that he has an appointment next week to get them refilled. He denies any headache, vision changes, chest pain or shortness of breath, numbness, tingling or focal weakness. No history of abdominal surgery. No sick contacts or recent international travel.  ROS: See HPI Constitutional: no fever  Eyes: no drainage  ENT: no runny nose   Cardiovascular:  no chest pain  Resp: no SOB  GI:  vomiting GU: no dysuria Integumentary: no rash  Allergy: no hives  Musculoskeletal: no leg swelling  Neurological: no slurred speech ROS otherwise negative  PAST MEDICAL HISTORY/PAST SURGICAL HISTORY:  Past Medical History:  Diagnosis Date  . Anxiety   . Asperger syndrome   . Depression   . H/O suicide attempt    overdose "many years ago"  . Homelessness   . Hypertension   . Prostate disorder   . Restless leg syndrome     MEDICATIONS:  Prior to Admission medications   Medication Sig Start Date End Date Taking? Authorizing Provider  clonazePAM (KLONOPIN) 0.5 MG tablet Take 1 tablet (0.5 mg total) by mouth at bedtime. 11/01/16   Beau FannyJohn C Withrow, FNP  cloNIDine (CATAPRES) 0.1 MG tablet Take 0.5 tablets (0.05 mg total) by mouth 2 (two) times daily. 11/01/16   Beau FannyJohn C Withrow, FNP  FLUoxetine (PROZAC) 40 MG capsule Take 1 capsule (40 mg total) by mouth daily. 11/02/16   Beau FannyJohn C Withrow, FNP  gabapentin (NEURONTIN) 400 MG capsule Take 2 capsules (800 mg total) by mouth 2 (two) times daily at 8 am and 10 pm. 11/01/16   Beau FannyJohn C Withrow, FNP  hydrOXYzine  (ATARAX/VISTARIL) 25 MG tablet Take 1 tablet (25 mg total) by mouth 3 (three) times daily as needed for anxiety. 11/01/16   Beau FannyJohn C Withrow, FNP  lisinopril (PRINIVIL,ZESTRIL) 20 MG tablet Take 1 tablet (20 mg total) by mouth daily. 11/02/16   Beau FannyJohn C Withrow, FNP  loratadine (CLARITIN) 10 MG tablet Take 1 tablet (10 mg total) by mouth daily. One po daily x 5 days Patient not taking: Reported on 10/26/2016 01/16/16   Arby BarretteMarcy Pfeiffer, MD  propranolol (INDERAL) 20 MG tablet Take 1 tablet (20 mg total) by mouth 2 (two) times daily as needed (anxiety). 11/01/16   Beau FannyJohn C Withrow, FNP  QUEtiapine (SEROQUEL) 100 MG tablet Take 1 tablet (100 mg total) by mouth at bedtime. 11/01/16   Beau FannyJohn C Withrow, FNP  tamsulosin (FLOMAX) 0.4 MG CAPS capsule Take 2 capsules (0.8 mg total) by mouth daily. 11/02/16   Beau FannyJohn C Withrow, FNP    ALLERGIES:  Allergies  Allergen Reactions  . Amoxicillin Other (See Comments)    Ineffective (patient states his is resistant)  . Cephalexin Nausea And Vomiting  . Lemon Oil Itching  . Tape Other (See Comments)    Please use "paper" tape only.  Irritation, redness  . Codeine Itching    SOCIAL HISTORY:  Social History  Substance Use Topics  . Smoking status: Former Smoker    Years: 2.00  . Smokeless tobacco: Never Used  . Alcohol use No  FAMILY HISTORY: History reviewed. No pertinent family history.  EXAM: BP (!) 188/133   Pulse 114   Temp 98.3 F (36.8 C) (Oral)   Resp 17   Ht 5\' 8"  (1.727 m)   Wt 169 lb 7 oz (76.9 kg)   SpO2 96%   BMI 25.76 kg/m  CONSTITUTIONAL: Alert and oriented and responds appropriately to questions. Well-appearing; well-nourished HEAD: Normocephalic EYES: Conjunctivae clear, PERRL, EOMI ENT: normal nose; no rhinorrhea; moist mucous membranes NECK: Supple, no meningismus, no nuchal rigidity, no LAD  CARD: Regular and tachycardic; S1 and S2 appreciated; no murmurs, no clicks, no rubs, no gallops RESP: Normal chest excursion without  splinting or tachypnea; breath sounds clear and equal bilaterally; no wheezes, no rhonchi, no rales, no hypoxia or respiratory distress, speaking full sentences ABD/GI: Normal bowel sounds; non-distended; soft, non-tender, no rebound, no guarding, no peritoneal signs, no hepatosplenomegaly BACK:  The back appears normal and is non-tender to palpation, there is no CVA tenderness EXT: Normal ROM in all joints; non-tender to palpation; no edema; normal capillary refill; no cyanosis, no calf tenderness or swelling    SKIN: Normal color for age and race; warm; no rash NEURO: Moves all extremities equally, sensation to light touch intact diffusely, cranial nerves II through XII intact, normal speech PSYCH: The patient's mood and manner are appropriate. Grooming and personal hygiene are appropriate.  MEDICAL DECISION MAKING: Patient here with nausea and vomiting. Suspect viral illness versus bad food exposure. Abdominal exam completely benign. He is hypertensive and tachycardic and states he has been without his medications including his propanolol for over the past week. He has no symptoms associated with his hypertension including headache, vision changes, neurologic deficits, chest pain or shortness of breath. Labs ordered in triage are unremarkable other than mild leukocytosis. Will obtain urinalysis, EKG. We'll reorder his home medications. Patient reports feeling better after Zofran. We'll fluid challenge. Doubt appendicitis, cholecystitis, pancreatitis, colitis, diverticulitis or bowel obstruction based on his benign exam. I do not feel he needs emergent abdominal imaging.  ED PROGRESS: Urine shows mild proteinuria but no sign of infection. Patient is drinking without difficulty. Heart rate and blood pressure have improved after his home medications. He has been provided with a refill of all of his medications and we will recommend close outpatient follow-up. He has PCP follow-up scheduled. We'll discharge  with Zofran to take as needed for nausea and vomiting at home. Discussed return precautions. He is comfortable with this plan.   At this time, I do not feel there is any life-threatening condition present. I have reviewed and discussed all results (EKG, imaging, lab, urine as appropriate) and exam findings with patient/family. I have reviewed nursing notes and appropriate previous records.  I feel the patient is safe to be discharged home without further emergent workup and can continue workup as an outpatient as needed. Discussed usual and customary return precautions. Patient/family verbalize understanding and are comfortable with this plan.  Outpatient follow-up has been provided. All questions have been answered.    EKG Interpretation  Date/Time:  Tuesday January 24 2017 06:30:52 EST Ventricular Rate:  101 PR Interval:    QRS Duration: 92 QT Interval:  338 QTC Calculation: 439 R Axis:   49 Text Interpretation:  Sinus tachycardia Baseline wander in lead(s) V3 No significant change since last tracing Confirmed by Raine Blodgett,  DO, Onita Pfluger [phone]) on 01/24/2017 6:38:06 AM         Layla Maw Adar Rase, DO 01/24/17 [id number]

## 2017-01-24 NOTE — ED Notes (Signed)
Signature pad not working, pt verbalized understanding of discharge instructions and prescriptions.  All questions answered at this time.

## 2017-01-24 NOTE — ED Notes (Signed)
Pt states he is feeling worse. 

## 2017-02-06 ENCOUNTER — Encounter: Payer: Self-pay | Admitting: Family Medicine

## 2017-02-06 ENCOUNTER — Ambulatory Visit: Payer: Medicaid Other | Attending: Family Medicine | Admitting: Family Medicine

## 2017-02-06 VITALS — BP 111/84 | HR 91 | Temp 98.0°F | Resp 18 | Ht 68.0 in | Wt 184.0 lb

## 2017-02-06 DIAGNOSIS — I10 Essential (primary) hypertension: Secondary | ICD-10-CM | POA: Insufficient documentation

## 2017-02-06 DIAGNOSIS — Z880 Allergy status to penicillin: Secondary | ICD-10-CM | POA: Diagnosis not present

## 2017-02-06 DIAGNOSIS — B353 Tinea pedis: Secondary | ICD-10-CM | POA: Insufficient documentation

## 2017-02-06 DIAGNOSIS — F845 Asperger's syndrome: Secondary | ICD-10-CM | POA: Diagnosis not present

## 2017-02-06 DIAGNOSIS — Z79899 Other long term (current) drug therapy: Secondary | ICD-10-CM | POA: Diagnosis not present

## 2017-02-06 DIAGNOSIS — F419 Anxiety disorder, unspecified: Secondary | ICD-10-CM | POA: Insufficient documentation

## 2017-02-06 DIAGNOSIS — N400 Enlarged prostate without lower urinary tract symptoms: Secondary | ICD-10-CM | POA: Insufficient documentation

## 2017-02-06 MED ORDER — TERBINAFINE HCL 1 % EX CREA: 1.0000 "application " | TOPICAL_CREAM | Freq: Two times a day (BID) | CUTANEOUS | 1 refills | Status: DC

## 2017-02-06 NOTE — Progress Notes (Signed)
Subjective:  Patient ID: Zachary ButterKevin Ariola, male    DOB: 1961-05-23  Age: 56 y.o. MRN: [id number]  CC: Hypertension   HPI Zachary Russo is a 56 year old male with a history of his progress syndrome, hypertension, anxiety, benign prostatic hyperplasia who presents today to establish care.  [facility]. His last refill came from the emergency room and he does not need any today. His anxiety was previously [facility] by [facility] and he has not been to see his mental health provider of recent. His urinary symptoms have pretty much controlled on Flomax.  He complains of intermittent itching between the toes of his feet; has had a previous history of athlete's foot. Denies chest pain, shortness of breath or pedal edema.  Past Medical History:  Diagnosis Date  . Anxiety   . Asperger syndrome   . Depression   . H/O suicide attempt    overdose "many years ago"  . Homelessness   . Hypertension   . Prostate disorder   . Restless leg syndrome     History reviewed. No pertinent surgical history.  Allergies  Allergen Reactions  . Amoxicillin Other (See Comments)    Ineffective (patient states his is resistant)  . Cephalexin Nausea And Vomiting  . Lemon Oil Itching  . Tape Other (See Comments)    Please use "paper" tape only.  Irritation, redness  . Codeine Itching    Outpatient Medications Prior to Visit  Medication Sig Dispense Refill  . clonazePAM (KLONOPIN) 0.5 MG tablet Take 1 tablet (0.5 mg total) by mouth at bedtime. 10 tablet 0  . cloNIDine (CATAPRES) 0.1 MG tablet Take 0.5 tablets (0.05 mg total) by mouth 2 (two) times daily. 60 tablet 0  . FLUoxetine (PROZAC) 40 MG capsule Take 1 capsule (40 mg total) by mouth daily. 30 capsule 0  . gabapentin (NEURONTIN) 400 MG capsule Take 2 capsules (800 mg total) by mouth 2 (two) times daily at 8 am and 10 pm. 120 capsule 0  . hydrOXYzine (ATARAX/VISTARIL) 25 MG tablet Take 1 tablet (25 mg  total) by mouth 3 (three) times daily as needed for anxiety. 30 tablet 0  . lisinopril (PRINIVIL,ZESTRIL) 20 MG tablet Take 1 tablet (20 mg total) by mouth daily. 30 tablet 0  . ondansetron (ZOFRAN ODT) 4 MG disintegrating tablet Take 1 tablet (4 mg total) by mouth every 8 (eight) hours as needed for nausea or vomiting. 20 tablet 0  . propranolol (INDERAL) 20 MG tablet Take 1 tablet (20 mg total) by mouth 2 (two) times daily as needed (anxiety). 60 tablet 0  . QUEtiapine (SEROQUEL) 100 MG tablet Take 1 tablet (100 mg total) by mouth at bedtime. 30 tablet 0  . tamsulosin (FLOMAX) 0.4 MG CAPS capsule Take 2 capsules (0.8 mg total) by mouth daily. 60 capsule 0   No facility-administered medications prior to visit.     ROS Review of Systems  Constitutional: Negative for activity change and appetite change.  HENT: Negative for sinus pressure and sore throat.   Eyes: Negative for visual disturbance.  Respiratory: Negative for cough, chest tightness and shortness of breath.   Cardiovascular: Negative for chest pain and leg swelling.  Gastrointestinal: Negative for abdominal distention, abdominal pain, constipation and diarrhea.  Endocrine: Negative.   Genitourinary: Negative for dysuria.  Musculoskeletal: Negative for joint swelling and myalgias.  Skin: Negative for rash.       See hpi  Allergic/Immunologic: Negative.   Neurological: Negative for  weakness, light-headedness and numbness.  Psychiatric/Behavioral: Negative for dysphoric mood and suicidal ideas.    Objective:  BP 111/84 (BP Location: Left Arm, Patient Position: Sitting, Cuff Size: Normal)   Pulse 91   Temp 98 F (36.7 C) (Oral)   Resp 18   Ht 5\' 8"  (1.727 m)   Wt 184 lb (83.5 kg)   SpO2 98%   BMI 27.98 kg/m   BP/Weight 02/06/2017 01/24/2017 01/23/2017  Systolic BP 111 149 -  Diastolic BP 84 105 -  Wt. (Lbs) 184 - 169.44  BMI 27.98 25.76 -  Some encounter information is confidential and restricted. Go to Review Flowsheets  activity to see all data.      Physical Exam  Constitutional: He is oriented to person, place, and time. He appears well-developed and well-nourished.  Cardiovascular: Normal rate, normal heart sounds and intact distal pulses.   No murmur heard. Pulmonary/Chest: Effort normal and breath sounds normal. He has no wheezes. He has no rales. He exhibits no tenderness.  Abdominal: Soft. Bowel sounds are normal. He exhibits no distension and no mass. There is no tenderness.  Musculoskeletal: Normal range of motion. He exhibits no edema.  Neurological: He is alert and oriented to person, place, and time.     Assessment & Plan:   1. Asperger syndrome Encouraged to schedule follow-up appointment at [facility]  2. Benign prostatic hyperplasia without lower urinary tract symptoms Controlled on Flomax  3. Essential hypertension Controlled Continue antihypertensives, low-sodium diet  4. Anxiety disorder, unspecified type Remains on Prozac. I have informed him if he needs to stay on Klonopin he would need to obtain that from his psychiatrist; I will not be keeping him on the benzodiazepine.  5. Tinea pedis of right foot - terbinafine (LAMISIL AT) 1 % cream; Apply 1 application topically 2 (two) times daily.  Dispense: 30 g; Refill: 1   Meds ordered this encounter  Medications  . terbinafine (LAMISIL AT) 1 % cream    Sig: Apply 1 application topically 2 (two) times daily.    Dispense:  30 g    Refill:  1    Follow-up: Return in about 2 weeks (around 02/20/2017) for Follow-up on chronic medical conditions.   Jaclyn Shaggy MD

## 2017-02-06 NOTE — Progress Notes (Signed)
Patient is here for HTN   Patient denies pain at this time.  Patient has taken medication today. Patient has eaten today.  Patient denies any suicidal ideations at this time.

## 2017-02-06 NOTE — Patient Instructions (Signed)
Athlete's Foot Introduction Athlete's foot (tinea pedis) is a fungal infection of the skin on the feet. It often occurs on the skin that is between or underneath the toes. It can also occur on the soles of the feet. The infection can spread from person to person (is contagious). Follow these instructions at home:  Apply or take over-the-counter and prescription medicines only as told by your doctor.  Keep all follow-up visits as told by your doctor. This is important.  Do not scratch your feet.  Keep your feet dry:  Wear cotton or wool socks. Change your socks every day or if they become wet.  Wear shoes that allow air to move around, such as sandals or canvas tennis shoes.  Wash and dry your feet:  Every day or as told by your doctor.  After exercising.  Including the area between your toes.  Wear sandals in wet areas, such as locker rooms and shared showers.  Do not share any of these items:  Towels.  Nail clippers.  Other personal items that touch your feet.  If you have diabetes, keep your blood sugar under control. Contact a doctor if:  You have a fever.  You have swelling, soreness, warmth, or redness in your foot.  You are not getting better with treatment.  Your symptoms get worse.  You have new symptoms. This information is not intended to replace advice given to you by your health care provider. Make sure you discuss any questions you have with your health care provider. Document Released: 05/23/2008 Document Revised: 05/12/2016 Document Reviewed: 06/08/2015  2017 Elsevier  

## 2017-02-20 ENCOUNTER — Encounter: Payer: Self-pay | Admitting: Family Medicine

## 2017-02-20 ENCOUNTER — Ambulatory Visit: Payer: Self-pay | Admitting: Family Medicine

## 2017-02-20 ENCOUNTER — Ambulatory Visit: Payer: Medicaid Other | Attending: Family Medicine | Admitting: Family Medicine

## 2017-02-20 VITALS — BP 110/71 | HR 70 | Temp 98.3°F | Ht 68.0 in | Wt 179.6 lb

## 2017-02-20 DIAGNOSIS — F332 Major depressive disorder, recurrent severe without psychotic features: Secondary | ICD-10-CM

## 2017-02-20 DIAGNOSIS — Z79899 Other long term (current) drug therapy: Secondary | ICD-10-CM | POA: Diagnosis not present

## 2017-02-20 DIAGNOSIS — F419 Anxiety disorder, unspecified: Secondary | ICD-10-CM

## 2017-02-20 DIAGNOSIS — G4709 Other insomnia: Secondary | ICD-10-CM | POA: Diagnosis not present

## 2017-02-20 DIAGNOSIS — F845 Asperger's syndrome: Secondary | ICD-10-CM | POA: Diagnosis not present

## 2017-02-20 DIAGNOSIS — I10 Essential (primary) hypertension: Secondary | ICD-10-CM

## 2017-02-20 DIAGNOSIS — G4700 Insomnia, unspecified: Secondary | ICD-10-CM | POA: Insufficient documentation

## 2017-02-20 DIAGNOSIS — Z880 Allergy status to penicillin: Secondary | ICD-10-CM | POA: Diagnosis not present

## 2017-02-20 DIAGNOSIS — N400 Enlarged prostate without lower urinary tract symptoms: Secondary | ICD-10-CM

## 2017-02-20 LAB — COMPLETE METABOLIC PANEL WITH GFR
ALT: 21 U/L (ref 9–46)
AST: 16 U/L (ref 10–35)
Albumin: 4.2 g/dL (ref 3.6–5.1)
Alkaline Phosphatase: 107 U/L (ref 40–115)
BILIRUBIN TOTAL: 0.4 mg/dL (ref 0.2–1.2)
BUN: 13 mg/dL (ref 7–25)
CHLORIDE: 105 mmol/L (ref 98–110)
CO2: 28 mmol/L (ref 20–31)
CREATININE: 0.97 mg/dL (ref 0.70–1.33)
Calcium: 9.2 mg/dL (ref 8.6–10.3)
GFR, Est African American: >89 mL/min (ref >=60)
GFR, Est Non African American: 88 mL/min (ref >=60)
GLUCOSE: 103 mg/dL — AB (ref 65–99)
Potassium: 4.7 mmol/L (ref 3.5–5.3)
SODIUM: 139 mmol/L (ref 135–146)
TOTAL PROTEIN: 6.5 g/dL (ref 6.1–8.1)

## 2017-02-20 LAB — LIPID PANEL
Cholesterol: 240 mg/dL — ABNORMAL HIGH (ref <200)
HDL: 35 mg/dL — ABNORMAL LOW (ref >40)
LDL CALC: 183 mg/dL — AB (ref <100)
TRIGLYCERIDES: 112 mg/dL (ref <150)
Total CHOL/HDL Ratio: 6.9 Ratio — ABNORMAL HIGH (ref <5.0)
VLDL: 22 mg/dL (ref <30)

## 2017-02-20 MED ORDER — QUETIAPINE FUMARATE 100 MG PO TABS: 100.0000 mg | ORAL_TABLET | Freq: Every day | ORAL | 3 refills | Status: AC

## 2017-02-20 MED ORDER — PROPRANOLOL HCL 20 MG PO TABS: 20.0000 mg | ORAL_TABLET | Freq: Two times a day (BID) | ORAL | 3 refills | Status: DC | PRN

## 2017-02-20 MED ORDER — LISINOPRIL 20 MG PO TABS: 20.0000 mg | ORAL_TABLET | Freq: Every day | ORAL | 3 refills | Status: DC

## 2017-02-20 MED ORDER — ONDANSETRON 4 MG PO TBDP: 4.0000 mg | ORAL_TABLET | Freq: Three times a day (TID) | ORAL | 1 refills | Status: DC | PRN

## 2017-02-20 MED ORDER — HYDROXYZINE HCL 50 MG PO TABS: 50.0000 mg | ORAL_TABLET | Freq: Three times a day (TID) | ORAL | 3 refills | Status: DC | PRN

## 2017-02-20 MED ORDER — CLONIDINE HCL 0.1 MG PO TABS: 0.0500 mg | ORAL_TABLET | Freq: Two times a day (BID) | ORAL | 3 refills | Status: DC

## 2017-02-20 MED ORDER — GABAPENTIN 400 MG PO CAPS: 800.0000 mg | ORAL_CAPSULE | Freq: Two times a day (BID) | ORAL | 3 refills | Status: DC

## 2017-02-20 MED ORDER — TAMSULOSIN HCL 0.4 MG PO CAPS: 0.8000 mg | ORAL_CAPSULE | Freq: Every day | ORAL | 3 refills | Status: DC

## 2017-02-20 MED ORDER — FLUOXETINE HCL 40 MG PO CAPS: 40.0000 mg | ORAL_CAPSULE | Freq: Every day | ORAL | 3 refills | Status: AC

## 2017-02-20 NOTE — Patient Instructions (Signed)

## 2017-02-20 NOTE — Progress Notes (Signed)
Refills on klonopin, clonidine, gabapentin, hydroxyzine, lisinopril, zofran, inderal, seroquel, flomax  Fasting today for labs

## 2017-02-20 NOTE — Progress Notes (Signed)
Subjective:  Patient ID: Zachary Russo, male    DOB: [birth year]  Age: 56 y.o. MRN: [id number]  CC: Hypertension; Benign Prostatic Hypertrophy; and would like to increase dose - seroquel   HPI Zachary Russo  is a 56 year old male with a history of Aspergers syndrome, hypertension, anxiety, benign prostatic hyperplasia who presents today For a follow-up visit.  He has been compliant with his antihypertensives as most of the medications and denies side effects. He is requesting refills today. Lower urinary tract symptoms are controlled on his Flomax.  He received Lamisil at this last visit for athlete's foot and reports improvement in symptoms.  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 Fasting in anticipation of labs today.  Past Medical History:  Diagnosis Date  . Anxiety   . Asperger syndrome   . Depression   . H/O suicide attempt    overdose "many years ago"  . Homelessness   . Hypertension   . Prostate disorder   . Restless leg syndrome     History reviewed. No pertinent surgical history.  Allergies  Allergen Reactions  . Amoxicillin Other (See Comments)    Ineffective (patient states his is resistant)  . Cephalexin Nausea And Vomiting  . Lemon Oil Itching  . Tape Other (See Comments)    Please use "paper" tape only.  Irritation, redness  . Codeine Itching     Outpatient Medications Prior to Visit  Medication Sig Dispense Refill  . terbinafine (LAMISIL AT) 1 % cream Apply 1 application topically 2 (two) times daily. 30 g 1  . cloNIDine (CATAPRES) 0.1 MG tablet Take 0.5 tablets (0.05 mg total) by mouth 2 (two) times daily. 60 tablet 0  . FLUoxetine (PROZAC) 40 MG capsule Take 1 capsule (40 mg total) by mouth daily. 30 capsule 0  . gabapentin (NEURONTIN) 400 MG  capsule Take 2 capsules (800 mg total) by mouth 2 (two) times daily at 8 am and 10 pm. 120 capsule 0  . hydrOXYzine (ATARAX/VISTARIL) 25 MG tablet Take 1 tablet (25 mg total) by mouth 3 (three) times daily as needed for anxiety. 30 tablet 0  . lisinopril (PRINIVIL,ZESTRIL) 20 MG tablet Take 1 tablet (20 mg total) by mouth daily. 30 tablet 0  . ondansetron (ZOFRAN ODT) 4 MG disintegrating tablet Take 1 tablet (4 mg total) by mouth every 8 (eight) hours as needed for nausea or vomiting. 20 tablet 0  . propranolol (INDERAL) 20 MG tablet Take 1 tablet (20 mg total) by mouth 2 (two) times daily as needed (anxiety). 60 tablet 0  . QUEtiapine (SEROQUEL) 100 MG tablet Take 1 tablet (100 mg total) by mouth at bedtime. 30 tablet 0  . tamsulosin (FLOMAX) 0.4 MG CAPS capsule Take 2 capsules (0.8 mg total) by mouth daily. 60 capsule 0  . clonazePAM (KLONOPIN) 0.5 MG tablet Take 1 tablet (0.5 mg total) by mouth at bedtime. (Patient not taking: Reported on 02/20/2017) 10 tablet 0   No facility-administered medications prior to visit.     ROS Review of Systems  Constitutional: Negative for activity change and appetite change.  HENT: Negative for sinus pressure and sore throat.   Eyes: Negative for visual disturbance.  Respiratory: Negative for cough, chest tightness and shortness of breath.   Cardiovascular: Negative for chest pain  and leg swelling.  Gastrointestinal: Negative for abdominal distention, abdominal pain, constipation and diarrhea.  Endocrine: Negative.   Genitourinary: Negative for dysuria.  Musculoskeletal: Negative for joint swelling and myalgias.  Skin: Negative for rash.  Allergic/Immunologic: Negative.   Neurological: Negative for weakness, light-headedness and numbness.  Psychiatric/Behavioral: Negative for dysphoric mood and suicidal ideas.    Objective:  BP 110/71 (BP Location: Right Arm, Patient Position: Sitting, Cuff Size: Small)   Pulse 70   Temp 98.3 F (36.8 C) (Oral)   Ht  5\' 8"  (1.727 m)   Wt 179 lb 9.6 oz (81.5 kg)   SpO2 96%   BMI 27.31 kg/m   BP/Weight 02/20/2017 02/06/2017 01/24/2017  Systolic BP 110 111 149  Diastolic BP 71 84 105  Wt. (Lbs) 179.6 184 -  BMI 27.31 27.98 25.76  Some encounter information is confidential and restricted. Go to Review Flowsheets activity to see all data.      Physical Exam  Constitutional: He is oriented to person, place, and time. He appears well-developed and well-nourished.  Cardiovascular: Normal rate, normal heart sounds and intact distal pulses.   No murmur heard. Pulmonary/Chest: Effort normal and breath sounds normal. He has no wheezes. He has no rales. He exhibits no tenderness.  Abdominal: Soft. Bowel sounds are normal. He exhibits no distension and no mass. There is no tenderness.  Musculoskeletal: Normal range of motion.  Neurological: He is alert and oriented to person, place, and time.  Skin: Skin is warm and dry.  Psychiatric: He has a normal mood and affect.     Assessment & Plan:   1. MDD (major depressive disorder), recurrent severe, without psychosis (HCC) Controlled - FLUoxetine (PROZAC) 40 MG capsule; Take 1 capsule (40 mg total) by mouth daily.  Dispense: 30 capsule; Refill: 3  2. Essential hypertension Controlled - propranolol (INDERAL) 20 MG tablet; Take 1 tablet (20 mg total) by mouth 2 (two) times daily as needed (anxiety).  Dispense: 60 tablet; Refill: 3 - lisinopril (PRINIVIL,ZESTRIL) 20 MG tablet; Take 1 tablet (20 mg total) by mouth daily.  Dispense: 30 tablet; Refill: 3 - cloNIDine (CATAPRES) 0.1 MG tablet; Take 0.5 tablets (0.05 mg total) by mouth 2 (two) times daily.  Dispense: 60 tablet; Refill: 3 - COMPLETE METABOLIC PANEL WITH GFR - Lipid panel  3. Benign prostatic hyperplasia without lower urinary tract symptoms Controlled - tamsulosin (FLOMAX) 0.4 MG CAPS capsule; Take 2 capsules (0.8 mg total) by mouth daily.  Dispense: 60 capsule; Refill: 3  4. Asperger  syndrome Advised to schedule an appointment with mental health [facility]  5. Anxiety disorder, unspecified type Klonopin appears on his med list (he has been out of that for a while) but I have informed him I will be unable to refill that due to potential for addiction If he needs to remain on it he might have to obtain that from mental health. I have increased dose of hydroxyzine as per patient request - hydrOXYzine (ATARAX/VISTARIL) 50 MG tablet; Take 1 tablet (50 mg total) by mouth every 8 (eight) hours as needed for anxiety.  Dispense: 90 tablet; Refill: 3  6. Other insomnia - QUEtiapine (SEROQUEL) 100 MG tablet; Take 1 tablet (100 mg total) by mouth at bedtime.  Dispense: 30 tablet; Refill: 3   Meds ordered this encounter  Medications  . tamsulosin (FLOMAX) 0.4 MG CAPS capsule    Sig: Take 2 capsules (0.8 mg total) by mouth daily.    Dispense:  60 capsule    Refill:  3  . propranolol (INDERAL) 20 MG tablet    Sig: Take 1 tablet (20 mg total) by mouth 2 (two) times daily as needed (anxiety).    Dispense:  60 tablet    Refill:  3  . lisinopril (PRINIVIL,ZESTRIL) 20 MG tablet    Sig: Take 1 tablet (20 mg total) by mouth daily.    Dispense:  30 tablet    Refill:  3  . hydrOXYzine (ATARAX/VISTARIL) 50 MG tablet    Sig: Take 1 tablet (50 mg total) by mouth every 8 (eight) hours as needed for anxiety.    Dispense:  90 tablet    Refill:  3  . FLUoxetine (PROZAC) 40 MG capsule    Sig: Take 1 capsule (40 mg total) by mouth daily.    Dispense:  30 capsule    Refill:  3  . gabapentin (NEURONTIN) 400 MG capsule    Sig: Take 2 capsules (800 mg total) by mouth 2 (two) times daily at 8 am and 10 pm.    Dispense:  120 capsule    Refill:  3  . cloNIDine (CATAPRES) 0.1 MG tablet    Sig: Take 0.5 tablets (0.05 mg total) by mouth 2 (two) times daily.    Dispense:  60 tablet    Refill:  3  . ondansetron (ZOFRAN ODT) 4 MG disintegrating tablet    Sig: Take 1 tablet (4 mg total) by mouth every  8 (eight) hours as needed for nausea or vomiting.    Dispense:  30 tablet    Refill:  1  . QUEtiapine (SEROQUEL) 100 MG tablet    Sig: Take 1 tablet (100 mg total) by mouth at bedtime.    Dispense:  30 tablet    Refill:  3    Follow-up: Return in about 3 months (around 05/23/2017) for follow up on chronic medical conditions.   Jaclyn Shaggy MD

## 2017-02-21 ENCOUNTER — Other Ambulatory Visit: Payer: Self-pay | Admitting: Family Medicine

## 2017-02-21 DIAGNOSIS — E785 Hyperlipidemia, unspecified: Secondary | ICD-10-CM | POA: Insufficient documentation

## 2017-02-21 DIAGNOSIS — E7800 Pure hypercholesterolemia, unspecified: Secondary | ICD-10-CM

## 2017-02-21 MED ORDER — ATORVASTATIN CALCIUM 20 MG PO TABS: 20.0000 mg | ORAL_TABLET | Freq: Every day | ORAL | 3 refills | Status: DC

## 2017-02-28 ENCOUNTER — Telehealth: Payer: Self-pay

## 2017-02-28 NOTE — Telephone Encounter (Signed)
-----   Message from Jaclyn ShaggyEnobong Amao, MD sent at 02/21/2017  1:54 PM EST ----- Please inform him his lipids are elevated; I have sent a prescription for atorvastatin to his pharmacy. Encourage compliance with low cholesterol diet, exercise and weight loss.

## 2017-02-28 NOTE — Telephone Encounter (Signed)
Writer called patient and discussed his lab results.  Patient aware to pick up medication prescribed by MD.

## 2017-05-19 ENCOUNTER — Other Ambulatory Visit: Payer: Self-pay | Admitting: Family Medicine

## 2017-05-22 ENCOUNTER — Inpatient Hospital Stay (HOSPITAL_COMMUNITY)
Admission: EM | Admit: 2017-05-22 | Discharge: 2017-05-24 | DRG: 313 | Disposition: A | Discharge status: Home / Self Care | Payer: Medicaid Other | Attending: Family Medicine | Admitting: Family Medicine

## 2017-05-22 ENCOUNTER — Encounter (HOSPITAL_COMMUNITY): Payer: Self-pay | Admitting: Emergency Medicine

## 2017-05-22 ENCOUNTER — Emergency Department (HOSPITAL_COMMUNITY): Payer: Medicaid Other

## 2017-05-22 DIAGNOSIS — K209 Esophagitis, unspecified without bleeding: Secondary | ICD-10-CM

## 2017-05-22 DIAGNOSIS — I119 Hypertensive heart disease without heart failure: Secondary | ICD-10-CM | POA: Diagnosis present

## 2017-05-22 DIAGNOSIS — F419 Anxiety disorder, unspecified: Secondary | ICD-10-CM | POA: Diagnosis present

## 2017-05-22 DIAGNOSIS — Z87891 Personal history of nicotine dependence: Secondary | ICD-10-CM

## 2017-05-22 DIAGNOSIS — I10 Essential (primary) hypertension: Secondary | ICD-10-CM | POA: Diagnosis present

## 2017-05-22 DIAGNOSIS — F332 Major depressive disorder, recurrent severe without psychotic features: Secondary | ICD-10-CM | POA: Diagnosis present

## 2017-05-22 DIAGNOSIS — G2581 Restless legs syndrome: Secondary | ICD-10-CM | POA: Diagnosis present

## 2017-05-22 DIAGNOSIS — R112 Nausea with vomiting, unspecified: Secondary | ICD-10-CM | POA: Diagnosis present

## 2017-05-22 DIAGNOSIS — R079 Chest pain, unspecified: Principal | ICD-10-CM | POA: Diagnosis present

## 2017-05-22 DIAGNOSIS — F845 Asperger's syndrome: Secondary | ICD-10-CM | POA: Diagnosis present

## 2017-05-22 DIAGNOSIS — F329 Major depressive disorder, single episode, unspecified: Secondary | ICD-10-CM | POA: Diagnosis present

## 2017-05-22 DIAGNOSIS — I517 Cardiomegaly: Secondary | ICD-10-CM | POA: Diagnosis present

## 2017-05-22 DIAGNOSIS — Z881 Allergy status to other antibiotic agents status: Secondary | ICD-10-CM

## 2017-05-22 LAB — COMPREHENSIVE METABOLIC PANEL
ALK PHOS: 86 U/L (ref 38–126)
ALT: 33 U/L (ref 17–63)
ANION GAP: 10 (ref 5–15)
AST: 22 U/L (ref 15–41)
Albumin: 3.8 g/dL (ref 3.5–5.0)
BUN: 16 mg/dL (ref 6–20)
CALCIUM: 9.1 mg/dL (ref 8.9–10.3)
CO2: 26 mmol/L (ref 22–32)
Chloride: 104 mmol/L (ref 101–111)
Creatinine, Ser: 0.93 mg/dL (ref 0.61–1.24)
GFR calc non Af Amer: >60 mL/min (ref >60)
Glucose, Bld: 155 mg/dL — ABNORMAL HIGH (ref 65–99)
POTASSIUM: 3.7 mmol/L (ref 3.5–5.1)
SODIUM: 140 mmol/L (ref 135–145)
TOTAL PROTEIN: 7.1 g/dL (ref 6.5–8.1)
Total Bilirubin: 0.7 mg/dL (ref 0.3–1.2)

## 2017-05-22 LAB — CBC
HCT: 41.5 % (ref 39.0–52.0)
Hemoglobin: 13.8 g/dL (ref 13.0–17.0)
MCH: 29.7 pg (ref 26.0–34.0)
MCHC: 33.3 g/dL (ref 30.0–36.0)
MCV: 89.2 fL (ref 78.0–100.0)
PLATELETS: 263 10*3/uL (ref 150–400)
RBC: 4.65 MIL/uL (ref 4.22–5.81)
RDW: 13.5 % (ref 11.5–15.5)
WBC: 13.4 10*3/uL — ABNORMAL HIGH (ref 4.0–10.5)

## 2017-05-22 LAB — D-DIMER, QUANTITATIVE: D-Dimer, Quant: 3.14 ug/mL-FEU — ABNORMAL HIGH (ref 0.00–0.50)

## 2017-05-22 LAB — I-STAT TROPONIN, ED: TROPONIN I, POC: 0.03 ng/mL (ref 0.00–0.08)

## 2017-05-22 LAB — LIPASE, BLOOD: Lipase: 32 U/L (ref 11–51)

## 2017-05-22 MED ORDER — ASPIRIN 81 MG PO CHEW
324.0000 mg | CHEWABLE_TABLET | Freq: Once | ORAL | Status: AC
Start: 2017-05-22 — End: 2017-05-22
  Administered 2017-05-22: 324 mg via ORAL
  Filled 2017-05-22: qty 4

## 2017-05-22 MED ORDER — LISINOPRIL 20 MG PO TABS
20.0000 mg | ORAL_TABLET | Freq: Once | ORAL | Status: AC
  Administered 2017-05-22: 20 mg via ORAL
  Filled 2017-05-22: qty 1

## 2017-05-22 MED ORDER — CLONIDINE HCL 0.1 MG PO TABS
0.1000 mg | ORAL_TABLET | Freq: Once | ORAL | Status: AC
  Administered 2017-05-22: 0.1 mg via ORAL
  Filled 2017-05-22: qty 1

## 2017-05-22 MED ORDER — IOPAMIDOL (ISOVUE-370) INJECTION 76%
INTRAVENOUS | Status: AC
  Administered 2017-05-22: 100 mL via INTRAVENOUS
  Filled 2017-05-22: qty 100

## 2017-05-22 MED ORDER — ONDANSETRON HCL 4 MG/2ML IJ SOLN
4.0000 mg | Freq: Once | INTRAMUSCULAR | Status: AC
  Administered 2017-05-22: 4 mg via INTRAVENOUS
  Filled 2017-05-22: qty 2

## 2017-05-22 MED ORDER — SODIUM CHLORIDE 0.9 % IV BOLUS (SEPSIS)
500.0000 mL | Freq: Once | INTRAVENOUS | Status: AC
  Administered 2017-05-22: 500 mL via INTRAVENOUS

## 2017-05-22 MED ORDER — PROPRANOLOL HCL 20 MG PO TABS
20.0000 mg | ORAL_TABLET | Freq: Once | ORAL | Status: AC
  Administered 2017-05-22: 20 mg via ORAL
  Filled 2017-05-22: qty 1

## 2017-05-22 MED ORDER — FAMOTIDINE IN NACL 20-0.9 MG/50ML-% IV SOLN
20.0000 mg | Freq: Once | INTRAVENOUS | Status: AC
  Administered 2017-05-23: 20 mg via INTRAVENOUS
  Filled 2017-05-22: qty 50

## 2017-05-22 NOTE — ED Notes (Signed)
Patient transported to X-ray 

## 2017-05-22 NOTE — ED Notes (Signed)
Care handoff to Perpetue, RN 

## 2017-05-22 NOTE — ED Notes (Addendum)
PT arrives via EMS for vomiting x3. PT reports he believes he saw blood in first two episodes, but he was also drinking grape juice. PT reports in the third episode, he is sure he saw blood. PT reports chest discomfort during triage.

## 2017-05-22 NOTE — ED Provider Notes (Signed)
[facility]-EMERGENCY DEPT Provider Note   CSN: [id number] Arrival date & time: 05/22/17  1714     History   Chief Complaint Chief Complaint  Patient presents with  . Chest Pain  . Emesis    HPI Zachary Russo is a 56 y.o. male.  The history is provided by the patient and medical records. No language interpreter was used.  Chest Pain   Associated symptoms include nausea and vomiting. Pertinent negatives include no abdominal pain.  Emesis   Pertinent negatives include no abdominal pain and no diarrhea.   Zachary Russo is a 56 y.o. male  with a PMH of HTN, HLD, asperger's, anxiety/depression who presents to the Emergency Department complaining of central, non-radiating chest pain which began three days ago. Patient states that pain felt somewhat better over the weekend, but worsened today. Associated with nausea and three episodes of emesis today. No emesis prior to today. No diaphoresis, jaw pain or extremity pain. No medications taken prior to arrival including home medications for blood pressure (propranolol, clonidine, lisinopril). Hx of HTN, HLD. No known history of heart failure or ACS. Denies family cardiac history. No lower extremity swelling. No long travel, recent immobilizations or surgeries.    Past Medical History:  Diagnosis Date  . Anxiety   . Asperger syndrome   . Depression   . H/O suicide attempt    overdose "many years ago"  . Homelessness   . Hypertension   . Prostate disorder   . Restless leg syndrome     Patient Active Problem List   Diagnosis Date Noted  . Hyperlipidemia 02/21/2017  . Insomnia 02/20/2017  . Asperger syndrome 02/06/2017  . Benign prostatic hyperplasia 02/06/2017  . 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 Major depressive disorder, recurrent episode (HCC) 07/07/2015  . Anxiety disorder 01/27/2015  . Adjustment disorder with depressed mood 01/18/2015  . Acute encephalopathy 12/18/2013  .  Delirium 12/18/2013  . Essential hypertension 12/18/2013  . Abnormal brain CT 12/18/2013    History reviewed. No pertinent surgical history.     Home Medications    Prior to Admission medications   Medication Sig Start Date End Date Taking? Authorizing Provider  atorvastatin (LIPITOR) 20 MG tablet Take 1 tablet (20 mg total) by mouth daily. 02/21/17   Jaclyn Shaggy, MD  clonazePAM (KLONOPIN) 0.5 MG tablet Take 1 tablet (0.5 mg total) by mouth at bedtime. Patient not taking: Reported on 02/20/2017 01/24/17   Ward, Layla Maw, DO  cloNIDine (CATAPRES) 0.1 MG tablet Take 0.5 tablets (0.05 mg total) by mouth 2 (two) times daily. 02/20/17   Jaclyn Shaggy, MD  FLUoxetine (PROZAC) 40 MG capsule Take 1 capsule (40 mg total) by mouth daily. 02/20/17   Jaclyn Shaggy, MD  gabapentin (NEURONTIN) 400 MG capsule Take 2 capsules (800 mg total) by mouth 2 (two) times daily at 8 am and 10 pm. 02/20/17   Jaclyn Shaggy, MD  hydrOXYzine (ATARAX/VISTARIL) 50 MG tablet Take 1 tablet (50 mg total) by mouth every 8 (eight) hours as needed for anxiety. 02/20/17   Jaclyn Shaggy, MD  lisinopril (PRINIVIL,ZESTRIL) 20 MG tablet Take 1 tablet (20 mg total) by mouth daily. 02/20/17   Jaclyn Shaggy, MD  ondansetron (ZOFRAN-ODT) 4 MG disintegrating tablet DISSOLVE 1 TABLET IN MOUTH EVERY 8 HOURS AS NEEDED FOR NAUSEA OR VOMITING 05/22/17   Jaclyn Shaggy, MD  propranolol (INDERAL) 20 MG tablet Take 1 tablet (20 mg total) by mouth 2 (two) times daily as needed (anxiety). 02/20/17  Jaclyn ShaggyAmao, Enobong, MD  QUEtiapine (SEROQUEL) 100 MG tablet Take 1 tablet (100 mg total) by mouth at bedtime. 02/20/17   Jaclyn ShaggyAmao, Enobong, MD  tamsulosin (FLOMAX) 0.4 MG CAPS capsule Take 2 capsules (0.8 mg total) by mouth daily. 02/20/17   Jaclyn ShaggyAmao, Enobong, MD  terbinafine (LAMISIL AT) 1 % cream Apply 1 application topically 2 (two) times daily. 02/06/17   Jaclyn ShaggyAmao, Enobong, MD    Family History No family history on file.  Social History Social History  Substance Use Topics  .  Smoking status: Former Smoker    Years: 2.00  . Smokeless tobacco: Never Used  . Alcohol use No     Allergies   Amoxicillin; Cephalexin; Lemon oil; Tape; and Codeine   Review of Systems Review of Systems  Cardiovascular: Positive for chest pain.  Gastrointestinal: Positive for nausea and vomiting. Negative for abdominal pain, blood in stool, constipation and diarrhea.  All other systems reviewed and are negative.    Physical Exam Updated Vital Signs BP (!) 141/83   Pulse 91   Temp 99 F (37.2 C) (Oral)   Resp (!) 27   Ht 5\' 8"  (1.727 m)   Wt 83.9 kg (185 lb)   SpO2 94%   BMI 28.13 kg/m   Physical Exam  Constitutional: He is oriented to person, place, and time. He appears well-developed and well-nourished. No distress.  HENT:  Head: Normocephalic and atraumatic.  Neck: No JVD present.  Cardiovascular: Normal heart sounds.   No murmur heard. Tachycardic but regular.  Pulmonary/Chest: Effort normal and breath sounds normal. No respiratory distress. He has no wheezes. He has no rales. He exhibits no tenderness.  Abdominal: Soft. Bowel sounds are normal. He exhibits no distension. There is no tenderness.  Musculoskeletal: He exhibits no edema.  Neurological: He is alert and oriented to person, place, and time.  Skin: Skin is warm and dry.  Nursing note and vitals reviewed.    ED Treatments / Results  Labs (all labs ordered are listed, but only abnormal results are displayed) Labs Reviewed  CBC - Abnormal; Notable for the following:       Result Value   WBC 13.4 (*)    All other components within normal limits  COMPREHENSIVE METABOLIC PANEL - Abnormal; Notable for the following:    Glucose, Bld 155 (*)    All other components within normal limits  D-DIMER, QUANTITATIVE (NOT [facility]) - Abnormal; Notable for the following:    D-Dimer, Quant 3.14 (*)    All other components within normal limits  LIPASE, BLOOD  I-STAT TROPOININ, ED  I-STAT TROPOININ, ED     EKG  EKG Interpretation  Date/Time:  Monday May 22 2017 17:36:38 EDT Ventricular Rate:  123 PR Interval:    QRS Duration: 92 QT Interval:  303 QTC Calculation: 434 R Axis:   31 Text Interpretation:  Sinus tachycardia Abnormal R-wave progression, late transition Inferior infarct, old similar to prior EKG  Confirmed by Crista CurbLiu, Dana [phone]([id number]) on 05/22/2017 9:07:08 PM       Radiology Dg Chest 2 View  Result Date: 05/22/2017 CLINICAL DATA:  Mid chest pain.  Nausea and vomiting. EXAM: CHEST  2 VIEW COMPARISON:  02/03/2016 FINDINGS: The patient has bibasilar atelectasis, more on the right than the left. The lungs are otherwise clear. Heart size and pulmonary vascularity are normal. No discrete effusions. No visible free air under the diaphragm. IMPRESSION: Bibasilar atelectasis. Electronically Signed   By: Francene BoyersJames  Maxwell M.D.   On: 05/22/2017 19:18  Ct Angio Chest Pe W Or Wo Contrast  Result Date: 05/22/2017 CLINICAL DATA:  Chest pain and vomiting EXAM: CT ANGIOGRAPHY CHEST WITH CONTRAST TECHNIQUE: Multidetector CT imaging of the chest was performed using the standard protocol during bolus administration of intravenous contrast. Multiplanar CT image reconstructions and MIPs were obtained to evaluate the vascular anatomy. CONTRAST:  100 cc Isovue 370 intravenous COMPARISON:  Radiograph 05/22/2017 FINDINGS: Cardiovascular: Satisfactory opacification of the pulmonary arteries to the segmental level. No evidence of pulmonary embolism. Non aneurysmal aorta. No dissection is seen. Coronary artery calcification. Mild cardiomegaly. No large pericardial effusion. Mediastinum/Nodes: Trachea midline. No thyroid mass. No significantly enlarged lymph nodes. Circumferential distal esophageal wall thickening. Lungs/Pleura: Trace bilateral effusions with hazy atelectasis in both bases. Partial atelectasis of the right middle lobe. No focal consolidation. Upper Abdomen: Partially visualized hypodense lesions in the  upper pole of the left kidney inadequately characterized without contrast. Musculoskeletal: No chest wall abnormality. No acute or significant osseous findings. Review of the MIP images confirms the above findings. IMPRESSION: 1. Negative for acute pulmonary embolus or aortic dissection 2. Mild distal esophageal wall thickening suggestive of esophagitis 3. Trace pleural effusions and hazy bibasilar atelectasis. Electronically Signed   By: Jasmine Pang M.D.   On: 05/22/2017 23:40    Procedures Procedures (including critical care time)  Medications Ordered in ED Medications  famotidine (PEPCID) IVPB 20 mg premix (20 mg Intravenous New Bag/Given 05/23/17 0023)  ondansetron (ZOFRAN) injection 4 mg (4 mg Intravenous Given 05/22/17 1853)  propranolol (INDERAL) tablet 20 mg (20 mg Oral Given 05/22/17 1854)  lisinopril (PRINIVIL,ZESTRIL) tablet 20 mg (20 mg Oral Given 05/22/17 1854)  cloNIDine (CATAPRES) tablet 0.1 mg (0.1 mg Oral Given 05/22/17 1854)  sodium chloride 0.9 % bolus 500 mL (0 mLs Intravenous Stopped 05/22/17 2003)  aspirin chewable tablet 324 mg (324 mg Oral Given 05/22/17 2002)  iopamidol (ISOVUE-370) 76 % injection (100 mLs Intravenous Contrast Given 05/22/17 2310)     Initial Impression / Assessment and Plan / ED Course  I have reviewed the triage vital signs and the nursing notes.  Pertinent labs & imaging results that were available during my care of the patient were reviewed by me and considered in my medical decision making (see chart for details).    Zachary Russo is a 56 y.o. male who presents to ED for chest pain x 3 days which worsened today. Associated with three episodes of emesis today as well. Upon arrival to ER, patient hypertensive and tachycardic. He has not taken his home BP medications including propranolol. Home meds given. Trop negative. Cbc, cmp, lipase reassuring. EKG with nonspecific changes but no acute ischemic changes. On re-eval, patient chest pain free, however still  tachycardic despite propranolol. Will obtain d-dimer.   D-dimer elevated at 3.14 - CT angio ordered. Patient updated and with no complaints.   CT angio negative for PE or dissection. Does show mild distal esophageal wall thickening suggestive of esophagitis- Pepcid given. Patient with heart score of 4. Consulted hospitalist who will admit.   Patient discussed with Dr. Verdie Mosher who agrees with treatment plan.    Final Clinical Impressions(s) / ED Diagnoses   Final diagnoses:  Chest pain  Esophagitis    New Prescriptions New Prescriptions   No medications on file     Ward, Chase Picket, PA-C 05/23/17 0031    Lavera Guise, MD 05/23/17 1055

## 2017-05-23 ENCOUNTER — Observation Stay (HOSPITAL_BASED_OUTPATIENT_CLINIC_OR_DEPARTMENT_OTHER): Payer: Medicaid Other

## 2017-05-23 ENCOUNTER — Encounter: Payer: Self-pay | Admitting: Gastroenterology

## 2017-05-23 ENCOUNTER — Encounter (HOSPITAL_COMMUNITY): Payer: Self-pay | Admitting: Cardiology

## 2017-05-23 DIAGNOSIS — R112 Nausea with vomiting, unspecified: Secondary | ICD-10-CM | POA: Diagnosis present

## 2017-05-23 DIAGNOSIS — I119 Hypertensive heart disease without heart failure: Secondary | ICD-10-CM | POA: Diagnosis present

## 2017-05-23 DIAGNOSIS — Z881 Allergy status to other antibiotic agents status: Secondary | ICD-10-CM | POA: Diagnosis not present

## 2017-05-23 DIAGNOSIS — G2581 Restless legs syndrome: Secondary | ICD-10-CM | POA: Diagnosis present

## 2017-05-23 DIAGNOSIS — R079 Chest pain, unspecified: Secondary | ICD-10-CM | POA: Diagnosis present

## 2017-05-23 DIAGNOSIS — D72829 Elevated white blood cell count, unspecified: Secondary | ICD-10-CM | POA: Diagnosis not present

## 2017-05-23 DIAGNOSIS — F329 Major depressive disorder, single episode, unspecified: Secondary | ICD-10-CM | POA: Diagnosis not present

## 2017-05-23 DIAGNOSIS — F332 Major depressive disorder, recurrent severe without psychotic features: Secondary | ICD-10-CM | POA: Diagnosis present

## 2017-05-23 DIAGNOSIS — I10 Essential (primary) hypertension: Secondary | ICD-10-CM

## 2017-05-23 DIAGNOSIS — K209 Esophagitis, unspecified: Secondary | ICD-10-CM | POA: Diagnosis not present

## 2017-05-23 DIAGNOSIS — F845 Asperger's syndrome: Secondary | ICD-10-CM | POA: Diagnosis present

## 2017-05-23 DIAGNOSIS — Z87891 Personal history of nicotine dependence: Secondary | ICD-10-CM | POA: Diagnosis not present

## 2017-05-23 DIAGNOSIS — I360 Nonrheumatic tricuspid (valve) stenosis: Secondary | ICD-10-CM | POA: Diagnosis not present

## 2017-05-23 DIAGNOSIS — F419 Anxiety disorder, unspecified: Secondary | ICD-10-CM

## 2017-05-23 DIAGNOSIS — R066 Hiccough: Secondary | ICD-10-CM

## 2017-05-23 DIAGNOSIS — I517 Cardiomegaly: Secondary | ICD-10-CM | POA: Diagnosis present

## 2017-05-23 LAB — ECHOCARDIOGRAM COMPLETE
Height: 68 in
WEIGHTICAEL: 2838.4 [oz_av]

## 2017-05-23 LAB — HIV ANTIBODY (ROUTINE TESTING W REFLEX): HIV Screen 4th Generation wRfx: NONREACTIVE

## 2017-05-23 LAB — CBC WITH DIFFERENTIAL/PLATELET
BASOS ABS: 0 10*3/uL (ref 0.0–0.1)
Basophils Relative: 0 %
EOS PCT: 1 %
Eosinophils Absolute: 0.1 10*3/uL (ref 0.0–0.7)
HCT: 37.3 % — ABNORMAL LOW (ref 39.0–52.0)
Hemoglobin: 12.2 g/dL — ABNORMAL LOW (ref 13.0–17.0)
LYMPHS PCT: 25 %
Lymphs Abs: 3.2 10*3/uL (ref 0.7–4.0)
MCH: 29.4 pg (ref 26.0–34.0)
MCHC: 32.7 g/dL (ref 30.0–36.0)
MCV: 89.9 fL (ref 78.0–100.0)
Monocytes Absolute: 1 10*3/uL (ref 0.1–1.0)
Monocytes Relative: 8 %
Neutro Abs: 8.3 10*3/uL — ABNORMAL HIGH (ref 1.7–7.7)
Neutrophils Relative %: 66 %
PLATELETS: 231 10*3/uL (ref 150–400)
RBC: 4.15 MIL/uL — AB (ref 4.22–5.81)
RDW: 13.7 % (ref 11.5–15.5)
WBC: 12.6 10*3/uL — AB (ref 4.0–10.5)

## 2017-05-23 LAB — RAPID URINE DRUG SCREEN, HOSP PERFORMED
Amphetamines: NOT DETECTED
Barbiturates: NOT DETECTED
Benzodiazepines: NOT DETECTED
COCAINE: NOT DETECTED
OPIATES: NOT DETECTED
TETRAHYDROCANNABINOL: POSITIVE — AB

## 2017-05-23 LAB — BASIC METABOLIC PANEL
ANION GAP: 9 (ref 5–15)
BUN: 15 mg/dL (ref 6–20)
CO2: 25 mmol/L (ref 22–32)
Calcium: 8.5 mg/dL — ABNORMAL LOW (ref 8.9–10.3)
Chloride: 106 mmol/L (ref 101–111)
Creatinine, Ser: 0.93 mg/dL (ref 0.61–1.24)
GFR calc Af Amer: >60 mL/min (ref >60)
Glucose, Bld: 132 mg/dL — ABNORMAL HIGH (ref 65–99)
POTASSIUM: 3.7 mmol/L (ref 3.5–5.1)
SODIUM: 140 mmol/L (ref 135–145)

## 2017-05-23 LAB — I-STAT TROPONIN, ED: TROPONIN I, POC: 0.01 ng/mL (ref 0.00–0.08)

## 2017-05-23 LAB — LIPID PANEL
Cholesterol: 194 mg/dL (ref 0–200)
HDL: 39 mg/dL — AB (ref >40)
LDL CALC: 137 mg/dL — AB (ref 0–99)
Total CHOL/HDL Ratio: 5 RATIO
Triglycerides: 91 mg/dL (ref <150)
VLDL: 18 mg/dL (ref 0–40)

## 2017-05-23 LAB — TROPONIN I
Troponin I: 0.03 ng/mL (ref <0.03)
Troponin I: 0.03 ng/mL (ref <0.03)
Troponin I: <0.03 ng/mL (ref <0.03)

## 2017-05-23 MED ORDER — GI COCKTAIL ~~LOC~~: 30.0000 mL | Freq: Four times a day (QID) | ORAL | Status: DC | PRN

## 2017-05-23 MED ORDER — HYDROXYZINE HCL 25 MG PO TABS: 50.0000 mg | ORAL_TABLET | Freq: Three times a day (TID) | ORAL | Status: DC | PRN

## 2017-05-23 MED ORDER — ATORVASTATIN CALCIUM 20 MG PO TABS
20.0000 mg | ORAL_TABLET | Freq: Every day | ORAL | Status: DC
  Administered 2017-05-23: 20 mg via ORAL
  Filled 2017-05-23 (×2): qty 1

## 2017-05-23 MED ORDER — MORPHINE SULFATE (PF) 4 MG/ML IV SOLN: 2.0000 mg | INTRAVENOUS | Status: DC | PRN

## 2017-05-23 MED ORDER — PANTOPRAZOLE SODIUM 40 MG IV SOLR
40.0000 mg | Freq: Two times a day (BID) | INTRAVENOUS | Status: DC
  Administered 2017-05-23 – 2017-05-24 (×4): 40 mg via INTRAVENOUS
  Filled 2017-05-23 (×4): qty 40

## 2017-05-23 MED ORDER — QUETIAPINE FUMARATE 100 MG PO TABS
100.0000 mg | ORAL_TABLET | Freq: Every day | ORAL | Status: DC
  Administered 2017-05-23 (×2): 100 mg via ORAL
  Filled 2017-05-23 (×2): qty 1

## 2017-05-23 MED ORDER — CLONIDINE HCL 0.1 MG PO TABS
0.0500 mg | ORAL_TABLET | Freq: Two times a day (BID) | ORAL | Status: DC
  Administered 2017-05-23 – 2017-05-24 (×3): 0.05 mg via ORAL
  Filled 2017-05-23 (×3): qty 1

## 2017-05-23 MED ORDER — HYDRALAZINE HCL 20 MG/ML IJ SOLN: 10.0000 mg | INTRAMUSCULAR | Status: DC | PRN

## 2017-05-23 MED ORDER — SODIUM CHLORIDE 0.9 % IV SOLN
INTRAVENOUS | Status: DC
  Administered 2017-05-23 – 2017-05-24 (×2): via INTRAVENOUS

## 2017-05-23 MED ORDER — FLUOXETINE HCL 20 MG PO CAPS
40.0000 mg | ORAL_CAPSULE | Freq: Every day | ORAL | Status: DC
  Administered 2017-05-23 – 2017-05-24 (×2): 40 mg via ORAL
  Filled 2017-05-23 (×2): qty 2

## 2017-05-23 MED ORDER — GABAPENTIN 400 MG PO CAPS
800.0000 mg | ORAL_CAPSULE | Freq: Two times a day (BID) | ORAL | Status: DC
  Administered 2017-05-23 – 2017-05-24 (×3): 800 mg via ORAL
  Filled 2017-05-23 (×3): qty 2

## 2017-05-23 MED ORDER — ONDANSETRON HCL 4 MG/2ML IJ SOLN: 4.0000 mg | Freq: Four times a day (QID) | INTRAMUSCULAR | Status: DC | PRN

## 2017-05-23 MED ORDER — TAMSULOSIN HCL 0.4 MG PO CAPS
0.8000 mg | ORAL_CAPSULE | Freq: Every day | ORAL | Status: DC
  Administered 2017-05-23 – 2017-05-24 (×2): 0.8 mg via ORAL
  Filled 2017-05-23 (×2): qty 2

## 2017-05-23 MED ORDER — LABETALOL HCL 5 MG/ML IV SOLN
10.0000 mg | Freq: Once | INTRAVENOUS | Status: AC
  Administered 2017-05-23: 10 mg via INTRAVENOUS
  Filled 2017-05-23: qty 4

## 2017-05-23 MED ORDER — LISINOPRIL 20 MG PO TABS
20.0000 mg | ORAL_TABLET | Freq: Every day | ORAL | Status: DC
  Administered 2017-05-23 – 2017-05-24 (×2): 20 mg via ORAL
  Filled 2017-05-23 (×2): qty 2
  Filled 2017-05-23 (×2): qty 1

## 2017-05-23 MED ORDER — PROPRANOLOL HCL 20 MG PO TABS
20.0000 mg | ORAL_TABLET | Freq: Two times a day (BID) | ORAL | Status: DC | PRN
  Filled 2017-05-23: qty 1

## 2017-05-23 MED ORDER — ACETAMINOPHEN 325 MG PO TABS: 650.0000 mg | ORAL_TABLET | ORAL | Status: DC | PRN

## 2017-05-23 NOTE — H&P (Signed)
History and Physical    Zachary Russo ZOX:[id number] DOB: May 21, 1961 DOA: 05/22/2017  Referring MD/NP/PA:  Shanna Cisco, PA-C PCP: Jaclyn Shaggy, MD  Patient coming from: Home  Chief Complaint: Chest pain  HPI: Zachary Russo is a 56 y.o. male with medical history significant of HTN, depression, and anxiety; who presented with complaints of chest pain for last 4 days. Patient reports constant centralized chest tightness sensation that he rates a 5-6 out of 10 on the pain scale. Denies any radiation of pain. Associated symptoms included nausea and vomiting. Denies having any diaphoresis, loss of consciousness, shortness of breath, leg swelling, calf pain, recent travel, or recent sick contacts. Patient denies striking anything to relieve symptoms. Yesterday prior to coming to the emergency department patient reports 3 episodes of bloody vomitus although he reports having grape juice prior to the episodes. He has never had similar symptoms like this in the past.   ED Course: Upon admission to the emergency department patient was seen to be afebrile, pulse 86-124, respirations 17-28, blood pressure elevated up to 172/128, and O2 saturations maintained on room air. Labs revealed WBC 13.4, glucose 155, troponin negative, d-dimer elevated at 3.14. He was given home blood pressure medications clonidine , lisinopril, propranolol, and 10 mg of labetalol IV while in the ED. CT angiogram of the chest show no signs of a pulmonary embolus but mild distal esophageal wall thickening suggestive of esophagitis.   Review of Systems: As per HPI otherwise 10 point review of systems negative.   Past Medical History:  Diagnosis Date  . Anxiety   . Asperger syndrome   . Depression   . H/O suicide attempt    overdose "many years ago"  . Homelessness   . Hypertension   . Prostate disorder   . Restless leg syndrome     History reviewed. No pertinent surgical history.   reports that he has quit smoking. He quit after  2.00 years of use. He has never used smokeless tobacco. He reports that he uses drugs, including Marijuana. He reports that he does not drink alcohol.  Allergies  Allergen Reactions  . Amoxicillin Other (See Comments)    Ineffective (patient states his is resistant)  . Cephalexin Nausea And Vomiting  . Lemon Oil Itching  . Tape Other (See Comments)    Please use "paper" tape only.  Irritation, redness  . Codeine Itching    No family history on file.  Prior to Admission medications   Medication Sig Start Date End Date Taking? Authorizing Provider  atorvastatin (LIPITOR) 20 MG tablet Take 1 tablet (20 mg total) by mouth daily. 02/21/17   Jaclyn Shaggy, MD  clonazePAM (KLONOPIN) 0.5 MG tablet Take 1 tablet (0.5 mg total) by mouth at bedtime. Patient not taking: Reported on 02/20/2017 01/24/17   Ward, Layla Maw, DO  cloNIDine (CATAPRES) 0.1 MG tablet Take 0.5 tablets (0.05 mg total) by mouth 2 (two) times daily. 02/20/17   Jaclyn Shaggy, MD  FLUoxetine (PROZAC) 40 MG capsule Take 1 capsule (40 mg total) by mouth daily. 02/20/17   Jaclyn Shaggy, MD  gabapentin (NEURONTIN) 400 MG capsule Take 2 capsules (800 mg total) by mouth 2 (two) times daily at 8 am and 10 pm. 02/20/17   Jaclyn Shaggy, MD  hydrOXYzine (ATARAX/VISTARIL) 50 MG tablet Take 1 tablet (50 mg total) by mouth every 8 (eight) hours as needed for anxiety. 02/20/17   Jaclyn Shaggy, MD  lisinopril (PRINIVIL,ZESTRIL) 20 MG tablet Take 1 tablet (20 mg total) by mouth  daily. 02/20/17   Jaclyn Shaggy, MD  ondansetron (ZOFRAN-ODT) 4 MG disintegrating tablet DISSOLVE 1 TABLET IN MOUTH EVERY 8 HOURS AS NEEDED FOR NAUSEA OR VOMITING 05/22/17   Jaclyn Shaggy, MD  propranolol (INDERAL) 20 MG tablet Take 1 tablet (20 mg total) by mouth 2 (two) times daily as needed (anxiety). 02/20/17   Jaclyn Shaggy, MD  QUEtiapine (SEROQUEL) 100 MG tablet Take 1 tablet (100 mg total) by mouth at bedtime. 02/20/17   Jaclyn Shaggy, MD  tamsulosin (FLOMAX) 0.4 MG CAPS capsule Take 2  capsules (0.8 mg total) by mouth daily. 02/20/17   Jaclyn Shaggy, MD  terbinafine (LAMISIL AT) 1 % cream Apply 1 application topically 2 (two) times daily. 02/06/17   Jaclyn Shaggy, MD    Physical Exam:   Constitutional: NAD, calm, comfortable Vitals:   05/22/17 2001 05/22/17 2030 05/22/17 2333 05/23/17 0000  BP: (!) 142/123 (!) 170/121 (!) 157/112 (!) 141/83  Pulse:   91 91  Resp: 17 (!) 22 (!) 23 (!) 27  Temp:  99 F (37.2 C)    TempSrc:  Oral    SpO2:  96% 96% 94%  Weight:      Height:       Eyes: PERRL, lids and conjunctivae normal ENMT: Mucous membranes are moist. Posterior pharynx clear of any exudate or lesions. Normal dentition.  Neck: normal, supple, no masses, no thyromegaly Respiratory: clear to auscultation bilaterally, no wheezing, no crackles. Normal respiratory effort. No accessory muscle use.  Cardiovascular: Regular rate and rhythm, no murmurs / rubs / gallops. No extremity edema. 2+ pedal pulses. No carotid bruits.  Abdomen: no tenderness, no masses palpated. No hepatosplenomegaly. Bowel sounds positive.  Musculoskeletal: no clubbing / cyanosis. No joint deformity upper and lower extremities. Good ROM, no contractures. Normal muscle tone.  Skin: no rashes, lesions, ulcers. No induration Neurologic: CN 2-12 grossly intact. Sensation intact, DTR normal. Strength 5/5 in all 4.  Psychiatric: Normal judgment and insight. Alert and oriented x 3. Normal mood.     Labs on Admission: I have personally reviewed following labs and imaging studies  CBC:  Recent Labs Lab 05/22/17 1830  WBC 13.4*  HGB 13.8  HCT 41.5  MCV 89.2  PLT 263   Basic Metabolic Panel:  Recent Labs Lab 05/22/17 1830  NA 140  K 3.7  CL 104  CO2 26  GLUCOSE 155*  BUN 16  CREATININE 0.93  CALCIUM 9.1   GFR: Estimated Creatinine Clearance: 95.8 mL/min (by C-G formula based on SCr of 0.93 mg/dL). Liver Function Tests:  Recent Labs Lab 05/22/17 1830  AST 22  ALT 33  ALKPHOS 86    BILITOT 0.7  PROT 7.1  ALBUMIN 3.8    Recent Labs Lab 05/22/17 1810  LIPASE 32   No results for input(s): AMMONIA in the last 168 hours. Coagulation Profile: No results for input(s): INR, PROTIME in the last 168 hours. Cardiac Enzymes: No results for input(s): CKTOTAL, CKMB, CKMBINDEX, TROPONINI in the last 168 hours. BNP (last 3 results) No results for input(s): PROBNP in the last 8760 hours. HbA1C: No results for input(s): HGBA1C in the last 72 hours. CBG: No results for input(s): GLUCAP in the last 168 hours. Lipid Profile: No results for input(s): CHOL, HDL, LDLCALC, TRIG, CHOLHDL, LDLDIRECT in the last 72 hours. Thyroid Function Tests: No results for input(s): TSH, T4TOTAL, FREET4, T3FREE, THYROIDAB in the last 72 hours. Anemia Panel: No results for input(s): VITAMINB12, FOLATE, FERRITIN, TIBC, IRON, RETICCTPCT in the last 72 hours.  Urine analysis:    Component Value Date/Time   COLORURINE YELLOW 01/24/2017 0626   APPEARANCEUR CLEAR 01/24/2017 0626   LABSPEC 1.019 01/24/2017 0626   PHURINE 6.0 01/24/2017 0626   GLUCOSEU NEGATIVE 01/24/2017 0626   HGBUR NEGATIVE 01/24/2017 0626   BILIRUBINUR NEGATIVE 01/24/2017 0626   KETONESUR 20 (A) 01/24/2017 0626   PROTEINUR 30 (A) 01/24/2017 0626   UROBILINOGEN 0.2 10/01/2015 1230   NITRITE NEGATIVE 01/24/2017 0626   LEUKOCYTESUR NEGATIVE 01/24/2017 0626   Sepsis Labs: No results found for this or any previous visit (from the past 240 hour(s)).   Radiological Exams on Admission: Dg Chest 2 View  Result Date: 05/22/2017 CLINICAL DATA:  Mid chest pain.  Nausea and vomiting. EXAM: CHEST  2 VIEW COMPARISON:  02/03/2016 FINDINGS: The patient has bibasilar atelectasis, more on the right than the left. The lungs are otherwise clear. Heart size and pulmonary vascularity are normal. No discrete effusions. No visible free air under the diaphragm. IMPRESSION: Bibasilar atelectasis. Electronically Signed   By: Francene Boyers M.D.   On:  05/22/2017 19:18   Ct Angio Chest Pe W Or Wo Contrast  Result Date: 05/22/2017 CLINICAL DATA:  Chest pain and vomiting EXAM: CT ANGIOGRAPHY CHEST WITH CONTRAST TECHNIQUE: Multidetector CT imaging of the chest was performed using the standard protocol during bolus administration of intravenous contrast. Multiplanar CT image reconstructions and MIPs were obtained to evaluate the vascular anatomy. CONTRAST:  100 cc Isovue 370 intravenous COMPARISON:  Radiograph 05/22/2017 FINDINGS: Cardiovascular: Satisfactory opacification of the pulmonary arteries to the segmental level. No evidence of pulmonary embolism. Non aneurysmal aorta. No dissection is seen. Coronary artery calcification. Mild cardiomegaly. No large pericardial effusion. Mediastinum/Nodes: Trachea midline. No thyroid mass. No significantly enlarged lymph nodes. Circumferential distal esophageal wall thickening. Lungs/Pleura: Trace bilateral effusions with hazy atelectasis in both bases. Partial atelectasis of the right middle lobe. No focal consolidation. Upper Abdomen: Partially visualized hypodense lesions in the upper pole of the left kidney inadequately characterized without contrast. Musculoskeletal: No chest wall abnormality. No acute or significant osseous findings. Review of the MIP images confirms the above findings. IMPRESSION: 1. Negative for acute pulmonary embolus or aortic dissection 2. Mild distal esophageal wall thickening suggestive of esophagitis 3. Trace pleural effusions and hazy bibasilar atelectasis. Electronically Signed   By: Jasmine Pang M.D.   On: 05/22/2017 23:40    EKG: Independently reviewed. Sinus tachycardia 123 bpm  Assessment/Plan Chest pain: Acute. Patient with elevated d-dimer for which CT angiogram was performed and negative for any acute signs of pulmonary embolus. Initial EKG showing sinus tachycardia but troponin negative. heart score 4.  - Admit to telemetry bed - Trend cardiac enzymes - Check UDS and lipid  panel - Check echocardiogram - GI cocktail prn indigestion  - Determinant formal cardiology consult is warranted in a.m.  Nausea and vomiting/ question hematemesis: Acute.  - Check gastric hemoccult if patient has subsequent episodes of vomiting. - Zofran prn  GERD with Esophagitis: Acute. CT angiogram shows distal esophageal thickening suggestive of esophagitis. - Protonix IV - May warrant formal Consult GI in a.m.  Leukocytosis: Acute. WBC elevated at 13.4 - Follow-up repeat CBC in a.m.  Essential hypertension - Continue clonidine and lisinopril  - Hydralazine IV prn > sBP  Depression and anxiety - Continue seroquel, Prozac, propranolol prn, hydroxyzine prn  BPH  - Continue Flomax   DVT prophylaxis: SCD  Code Status: Full  Family Communication:  No family present at bedside  Disposition Plan: Possible discharge home  in a.m. if workup negative  Consults called: none Admission status: Observation   Clydie Braunondell A Taryll Reichenberger MD [facility] Pager [phone]  If 7PM-7AM, please contact night-coverage [email or web address] Password [email or web address]  05/23/2017, 1:05 AM

## 2017-05-23 NOTE — Progress Notes (Signed)
Patient admitted earlier today by Dr. Madelyn Flavorsondell Smith. See H&P for details.  56 year old male with a history of hypertension, depression, anxiety, who presented with complaints of chest pain for 4 days. Patient also noted to have an elevated d-dimer, CT angiogram performed which was negative for acute pulmonary embolism however did show coronary artery calcification. Echocardiogram showed normal EF 60-65%, grade 1 diastolic dysfunction. Troponins cycled and unremarkable. Cardiology consulted and appreciated, feels patient needs outpatient stress test and aggressive risk factor modification given coronary calcifications.   Patient also noted to have Mild distal esophageal wall thickening on CT scan. Will place on protonix and have patient follow up with Gastroenterology 05/31/2017 at 10:30am with Doug SouJessica Zehr, PA.   Time spent: 25 minutes  Marilena Trevathan D.O. [facility] Pager [phone]  If 7PM-7AM, please contact night-coverage [email or web address] Password [email or web address] 05/23/2017, 2:04 PM

## 2017-05-23 NOTE — Consult Note (Addendum)
The patient has been seen in conjunction with Fatima Blank, NPC. All aspects of care have been considered and discussed. The patient has been personally interviewed, examined, and all clinical data has been reviewed.   A careful history was taken and basically consists of prolonged hiccups associated with chest discomfort with each pickup. There is no exertional discomfort. The location of the discomfort is focal in the midsternal area. No associated shortness of breath or prior history of heart disease. Cardiac markers unremarkable.  Significant risk factors for cardiac events including hypertension, coronary artery calcification on chest CT, hyperlipidemia and anxiety.  EKG is normal. Echo was normal.  Exam is normal. Sinus tachycardia. Presumed tachycardia related to volume contraction.  There is no clear indication for cardiac evaluation based upon hiccup related twinges of chest pain. His profile does suggest increase risk for cardiac events in the future. It is not unreasonable to perform an outpatient stress test and to be aggressive with risk factor modification given coronary calcification noted coincidentally on CT scanning. Consider GI workup giving the increased thickness of the esophagus noted on CT.  Cardiology Consult    Patient ID: Zachary Russo MRN: [id number], DOB/AGE: 09-26-1961   Admit date: 05/22/2017 Date of Consult: 05/23/2017  Primary Physician: Jaclyn Shaggy, MD Primary Cardiologist: Chest pain   History of Present Illness    Zachary Russo is a 56 y.o. male with a past medical history significant for hypertension, depression and anxiety who is being seen today for the evaluation of chest pain at the request of Dr. Catha Gosselin.  The patient is complaining of having hiccups starting on Friday and lasting off and on through most of the weekend. Whenever he has the bouts of hiccups he also has chest pain that is difficult for him to characterize as pressure and also  mildly sharp. He has no radiation of pain or associated symptoms of shortness of breath, palpitations, lightheadedness or diaphoresis. He did have an episode of vomiting on Friday after eating an entire panel brownies and then 3 episodes of vomiting yesterday with what he describes as muddy brown blood. He has not had any hiccups since he's been admitted nor has he had any chest pain since admission.  The patient is a nonsmoker and does not drink alcohol. 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 He is currently on disability for depression and Asperger's syndrome. He is fairly active in his daily life and has had no exertional chest discomfort or dyspnea. He has had no orthopnea, PND or edema. The patient is adopted and knows none of his natural family medical history.  Troponins have been negative X 4. Electrolytes and kidney function are normal D-dimer was elevated at 3.14, however, CT angiography of the chest was negative for acute PE or aortic dissection, showed coronary artery calcification Chest x-ray showed only bibasilar atelectasis EKG shows sinus tachycardia at 123 bpm and abnormal R-wave progression, no acute ischemic changes  An echocardiogram done today shows normal systolic function with EF 60-65%, no regional wall motion abnormalities, grade 1 diastolic dysfunction and mild tricuspid regurgitation  Past Medical History   Past Medical History:  Diagnosis Date  . Anxiety   . Asperger syndrome   . Depression   . H/O suicide attempt    overdose "many years ago"  . Homelessness   . Hypertension   . Prostate disorder   . Restless leg syndrome  History reviewed. No pertinent surgical history.   Allergies  Allergies  Allergen Reactions  . Amoxicillin Other (See Comments)    Ineffective (patient states his is resistant)  . Cephalexin Nausea And Vomiting  . Lemon Oil Itching  . Tape Other (See Comments)     Please use "paper" tape only.  Irritation, redness  . Codeine Itching    Inpatient Medications    . atorvastatin  20 mg Oral Daily  . cloNIDine  0.05 mg Oral BID  . FLUoxetine  40 mg Oral Daily  . gabapentin  800 mg Oral BID AC & HS  . lisinopril  20 mg Oral Daily  . pantoprazole (PROTONIX) IV  40 mg Intravenous Q12H  . QUEtiapine  100 mg Oral QHS  . tamsulosin  0.8 mg Oral Daily    Family History    Family History  Problem Relation Age of Onset  . Adopted: Yes  . Family history unknown: Yes    Social History    Social History   Social History  . Marital status: Single    Spouse name: N/A  . Number of children: N/A  . Years of education: N/A   Occupational History  . Not on file.   Social History Main Topics  . Smoking status: Former Smoker    Years: 2.00  . Smokeless tobacco: Never Used  . Alcohol use No  . Drug use: Yes    Types: Marijuana     Comment: last time- "maybe" one month ago  . Sexual activity: Not Currently   Other Topics Concern  . Not on file   Social History Narrative  . No narrative on file     Review of Systems    General:  No chills, fever, night sweats or weight changes.  Cardiovascular:  No chest pain, dyspnea on exertion, edema, orthopnea, palpitations, paroxysmal nocturnal dyspnea. Dermatological: No rash, lesions/masses Respiratory: No cough, dyspnea Urologic: No hematuria, dysuria Abdominal:   Positive for vomiting times one on Friday and 3 yesterday with muddy brown blood Neurologic:  No visual changes, wkns, changes in mental status. All other systems reviewed and are otherwise negative except as noted above.  Physical Exam    Blood pressure 136/90, pulse 93, temperature 98.6 F (37 C), temperature source Oral, resp. rate 18, height 5\' 8"  (1.727 m), weight 177 lb 6.4 oz (80.5 kg), SpO2 95 %.  General: Pleasant, NAD Psych: Normal affect. Neuro: Alert and oriented X 3. Moves all extremities spontaneously. HEENT:  Normal  Neck: Supple without bruits or JVD. Lungs:  Resp regular and unlabored, CTA. Heart: RRR no s3, s4, or murmurs. Abdomen: Soft, non-tender, non-distended, BS + x 4.  Extremities: No clubbing, cyanosis or edema. DP/PT/Radials 2+ and equal bilaterally.  Labs    Troponin [facility] of Care Test)  Recent Labs  05/23/17 0027  TROPIPOC 0.01    Recent Labs  05/23/17 0208 05/23/17 0427 05/23/17 0735  TROPONINI 0.03* 0.03* <0.03   Lab Results  Component Value Date   WBC 12.6 (H) 05/23/2017   HGB 12.2 (L) 05/23/2017   HCT 37.3 (L) 05/23/2017   MCV 89.9 05/23/2017   PLT 231 05/23/2017     Recent Labs Lab 05/22/17 1830 05/23/17 0427  NA 140 140  K 3.7 3.7  CL 104 106  CO2 26 25  BUN 16 15  CREATININE 0.93 0.93  CALCIUM 9.1 8.5*  PROT 7.1  --   BILITOT 0.7  --   ALKPHOS 86  --  ALT 33  --   AST 22  --   GLUCOSE 155* 132*   Lab Results  Component Value Date   CHOL 194 05/23/2017   HDL 39 (L) 05/23/2017   LDLCALC 137 (H) 05/23/2017   TRIG 91 05/23/2017   Lab Results  Component Value Date   DDIMER 3.14 (H) 05/22/2017     Radiology Studies    Dg Chest 2 View  Result Date: 05/22/2017 CLINICAL DATA:  Mid chest pain.  Nausea and vomiting. EXAM: CHEST  2 VIEW COMPARISON:  02/03/2016 FINDINGS: The patient has bibasilar atelectasis, more on the right than the left. The lungs are otherwise clear. Heart size and pulmonary vascularity are normal. No discrete effusions. No visible free air under the diaphragm. IMPRESSION: Bibasilar atelectasis. Electronically Signed   By: Francene Boyers M.D.   On: 05/22/2017 19:18   Ct Angio Chest Pe W Or Wo Contrast  Result Date: 05/22/2017 CLINICAL DATA:  Chest pain and vomiting EXAM: CT ANGIOGRAPHY CHEST WITH CONTRAST TECHNIQUE: Multidetector CT imaging of the chest was performed using the standard protocol during bolus administration of intravenous contrast. Multiplanar CT image reconstructions and MIPs were obtained to evaluate the  vascular anatomy. CONTRAST:  100 cc Isovue 370 intravenous COMPARISON:  Radiograph 05/22/2017 FINDINGS: Cardiovascular: Satisfactory opacification of the pulmonary arteries to the segmental level. No evidence of pulmonary embolism. Non aneurysmal aorta. No dissection is seen. Coronary artery calcification. Mild cardiomegaly. No large pericardial effusion. Mediastinum/Nodes: Trachea midline. No thyroid mass. No significantly enlarged lymph nodes. Circumferential distal esophageal wall thickening. Lungs/Pleura: Trace bilateral effusions with hazy atelectasis in both bases. Partial atelectasis of the right middle lobe. No focal consolidation. Upper Abdomen: Partially visualized hypodense lesions in the upper pole of the left kidney inadequately characterized without contrast. Musculoskeletal: No chest wall abnormality. No acute or significant osseous findings. Review of the MIP images confirms the above findings. IMPRESSION: 1. Negative for acute pulmonary embolus or aortic dissection 2. Mild distal esophageal wall thickening suggestive of esophagitis 3. Trace pleural effusions and hazy bibasilar atelectasis. Electronically Signed   By: Jasmine Pang M.D.   On: 05/22/2017 23:40    EKG & Cardiac Imaging    EKG: sinus tachycardia at 123 bpm and abnormal R-wave progression, no acute ischemic changes  Echocardiogram: 05/23/17 Study Conclusions  - Left ventricle: The cavity size was normal. There was mild   concentric hypertrophy. Systolic function was normal. The   estimated ejection fraction was in the range of 60% to 65%. Wall   motion was normal; there were no regional wall motion   abnormalities. Doppler parameters are consistent with abnormal   left ventricular relaxation (grade 1 diastolic dysfunction). - Tricuspid valve: There was mild regurgitation.   Assessment & Plan    Chest pain -The patient has had intermittent chest pressure concurrent with hiccups beginning on Friday and lasting through  the weekend. No hiccups or chest pain since admission. No prior exertional chest discomfort or dyspnea. -Troponins have been negative X 4. Electrolytes and kidney function are normal -D-dimer was elevated at 3.14, however, CT angiography of the chest was negative for acute PE or aortic dissection -Chest x-ray showed only bibasilar atelectasis -EKG shows sinus tachycardia at 123 bpm and abnormal R-wave progression, no acute ischemic changes -An echocardiogram done today shows normal systolic function with EF 60-65%, no regional wall motion abnormalities, grade 1 diastolic dysfunction and mild tricuspid regurgitation -CVD risk factors include hypertension, hyperlipidemia and borderline diabetes (no known family history as pt is  adopted). Also has coronary artery calcification on CTA. At some point an outpatient stress test may be helpful in the future, but not indicated at present.  -The patient's symptoms may be related to GI issues with his hiccups and episodes of vomiting especially with blood yesterday and CT showing mild distal esophageal wall thickening suggestive of esophagitis.  Hypertension -Current medical therapy includes amlodipine 5 mg daily, clonidine 0.05 mg twice daily, lisinopril 20 mg daily -Blood pressure has been high since admission to 150s-160s over 100s-110s. BP at PCP office visits on 02/05/17 was 111/84, on 02/20/17 was 110/71. The patient has been out of his medications since last Thursday as he has not picked up the refills. He says that usually his BP is well controlled as long as he takes his meds. Restart meds.  -Patient takes propranolol as needed for anxiety.  Hyperlipidemia -Lipid panel on 05/23/2017: LDL 137 -Home meds include atorvastatin 20 mg daily just started on 02/28/17, would increase lipitor to 40 mg for better control of LDL and primary prevention.  Oletta CohnSigned, Janine Hammond, NP-C 05/23/2017, 11:49 AM Pager: [phone]

## 2017-05-24 DIAGNOSIS — R079 Chest pain, unspecified: Principal | ICD-10-CM

## 2017-05-24 LAB — CBC
HEMATOCRIT: 34.9 % — AB (ref 39.0–52.0)
HEMOGLOBIN: 11.3 g/dL — AB (ref 13.0–17.0)
MCH: 29.2 pg (ref 26.0–34.0)
MCHC: 32.4 g/dL (ref 30.0–36.0)
MCV: 90.2 fL (ref 78.0–100.0)
Platelets: 213 10*3/uL (ref 150–400)
RBC: 3.87 MIL/uL — AB (ref 4.22–5.81)
RDW: 13.4 % (ref 11.5–15.5)
WBC: 8.6 10*3/uL (ref 4.0–10.5)

## 2017-05-24 MED ORDER — PANTOPRAZOLE SODIUM 40 MG PO TBEC: 40.0000 mg | DELAYED_RELEASE_TABLET | Freq: Every day | ORAL | 0 refills | Status: DC

## 2017-05-24 NOTE — Progress Notes (Signed)
Patient refuses finger sticks for cbg checks. Will discuss with MD for possible alternatives to checking blood sugar.

## 2017-05-24 NOTE — Discharge Summary (Signed)
Physician Discharge Summary  [name] Primm WUJ:[id number] DOB: 1961/01/23 DOA: 05/22/2017  PCP: Jaclyn ShaggyAmao, Enobong, MD  Admit date: 05/22/2017 Discharge date: 05/24/2017  Time spent: > 35 minutes  Recommendations for Outpatient Follow-up:  1. Ensure patient f/u with GI specialist for further evaluation and recommendations regarding thickened esophagus.   Discharge Diagnoses:  Principal Problem:   Chest pain Active Problems:   Essential hypertension   Anxiety and depression   Nausea and vomiting   Discharge Condition: stable  Diet recommendation: carb modified diet.  Filed Weights   05/22/17 1741 05/23/17 0159  Weight: 83.9 kg (185 lb) 80.5 kg (177 lb 6.4 oz)    History of present illness:  56 year old male with a history of hypertension, depression, anxiety, who presented with complaints of chest pain for 4 days. Patient also noted to have an elevated d-dimer, CT angiogram performed which was negative for acute pulmonary embolism however did show coronary artery calcification.  Hospital Course:  Chest pain -Evaluated by cardiology who feels it is atypical. They do however recommend outpatient plain old exercise treadmill. - I suspect chest discomfort is more GI related given CT scan results. Will discharge on Protonix and recommended patient follow-up with gastroenterologist of which he verbalized understanding and agreement.  For other known medical conditions listed above will continue prior to admission medication regimen listed below  Procedures:  CT angiogram  Consultations:  Cardiology  Discharge Exam: Vitals:   05/24/17 0533 05/24/17 1343  BP:  (!) 161/99  Pulse: 81 86  Resp: 18 18  Temp: 98.6 F (37 C) 97.7 F (36.5 C)    General: Patient in no acute distress, alert and awake Cardiovascular: Regular rate and rhythm, no murmurs or rubs Respiratory: No increased work of breathing or wheezes  Discharge Instructions   Discharge Instructions    Call MD for:   severe uncontrolled pain    Complete by:  As directed    Call MD for:  temperature >100.4    Complete by:  As directed    Diet - low sodium heart healthy    Complete by:  As directed    Discharge instructions    Complete by:  As directed    Please follow-up with your cardiologist for further evaluation recommendations. Also ensure follow-up with gastroenterologist for evaluation of thickened esophagus   Increase activity slowly    Complete by:  As directed      Current Discharge Medication List    START taking these medications   Details  pantoprazole (PROTONIX) 40 MG tablet Take 1 tablet (40 mg total) by mouth daily. Qty: 30 tablet, Refills: 0      CONTINUE these medications which have NOT CHANGED   Details  amLODipine (NORVASC) 5 MG tablet Take 5 mg by mouth daily.    cetirizine (ZYRTEC) 10 MG tablet Take 10 mg by mouth daily as needed for allergies.    clonazePAM (KLONOPIN) 0.5 MG tablet Take 1 tablet (0.5 mg total) by mouth at bedtime. Qty: 10 tablet, Refills: 0    cloNIDine (CATAPRES) 0.1 MG tablet Take 0.5 tablets (0.05 mg total) by mouth 2 (two) times daily. Qty: 60 tablet, Refills: 3   Associated Diagnoses: Essential hypertension    FLUoxetine (PROZAC) 40 MG capsule Take 1 capsule (40 mg total) by mouth daily. Qty: 30 capsule, Refills: 3   Associated Diagnoses: MDD (major depressive disorder), recurrent severe, without psychosis (HCC)    gabapentin (NEURONTIN) 400 MG capsule Take 2 capsules (800 mg total) by mouth 2 (two)  times daily at 8 am and 10 pm. Qty: 120 capsule, Refills: 3    hydrOXYzine (ATARAX/VISTARIL) 50 MG tablet Take 1 tablet (50 mg total) by mouth every 8 (eight) hours as needed for anxiety. Qty: 90 tablet, Refills: 3   Associated Diagnoses: Anxiety disorder, unspecified type    lisinopril (PRINIVIL,ZESTRIL) 20 MG tablet Take 1 tablet (20 mg total) by mouth daily. Qty: 30 tablet, Refills: 3   Associated Diagnoses: Essential hypertension     montelukast (SINGULAIR) 10 MG tablet Take 10 mg by mouth at bedtime.    ondansetron (ZOFRAN-ODT) 4 MG disintegrating tablet DISSOLVE 1 TABLET IN MOUTH EVERY 8 HOURS AS NEEDED FOR NAUSEA OR VOMITING Qty: 30 tablet, Refills: 1    propranolol (INDERAL) 20 MG tablet Take 1 tablet (20 mg total) by mouth 2 (two) times daily as needed (anxiety). Qty: 60 tablet, Refills: 3   Associated Diagnoses: Essential hypertension    QUEtiapine (SEROQUEL) 100 MG tablet Take 1 tablet (100 mg total) by mouth at bedtime. Qty: 30 tablet, Refills: 3   Associated Diagnoses: Other insomnia    tamsulosin (FLOMAX) 0.4 MG CAPS capsule Take 2 capsules (0.8 mg total) by mouth daily. Qty: 60 capsule, Refills: 3   Associated Diagnoses: Benign prostatic hyperplasia without lower urinary tract symptoms    atorvastatin (LIPITOR) 20 MG tablet Take 1 tablet (20 mg total) by mouth daily. Qty: 30 tablet, Refills: 3    terbinafine (LAMISIL AT) 1 % cream Apply 1 application topically 2 (two) times daily. Qty: 30 g, Refills: 1   Associated Diagnoses: Tinea pedis of right foot       Allergies  Allergen Reactions  . Amoxicillin Other (See Comments)    Ineffective (patient states his is resistant)  . Cephalexin Nausea And Vomiting  . Lemon Oil Itching  . Tape Other (See Comments)    Please use "paper" tape only.  Irritation, redness  . Codeine Itching   Follow-up Information    Zehr, Princella Pellegrini, PA-C. Go on 05/31/2017.   Specialty:  Gastroenterology Why:  at 10:30am Contact information: [address] [place] [address] [phone]            The results of significant diagnostics from this hospitalization (including imaging, microbiology, ancillary and laboratory) are listed below for reference.    Significant Diagnostic Studies: Dg Chest 2 View  Result Date: 05/22/2017 CLINICAL DATA:  Mid chest pain.  Nausea and vomiting. EXAM: CHEST  2 VIEW COMPARISON:  02/03/2016 FINDINGS: The patient has bibasilar  atelectasis, more on the right than the left. The lungs are otherwise clear. Heart size and pulmonary vascularity are normal. No discrete effusions. No visible free air under the diaphragm. IMPRESSION: Bibasilar atelectasis. Electronically Signed   By: Francene Boyers M.D.   On: 05/22/2017 19:18   Ct Angio Chest Pe W Or Wo Contrast  Result Date: 05/22/2017 CLINICAL DATA:  Chest pain and vomiting EXAM: CT ANGIOGRAPHY CHEST WITH CONTRAST TECHNIQUE: Multidetector CT imaging of the chest was performed using the standard protocol during bolus administration of intravenous contrast. Multiplanar CT image reconstructions and MIPs were obtained to evaluate the vascular anatomy. CONTRAST:  100 cc Isovue 370 intravenous COMPARISON:  Radiograph 05/22/2017 FINDINGS: Cardiovascular: Satisfactory opacification of the pulmonary arteries to the segmental level. No evidence of pulmonary embolism. Non aneurysmal aorta. No dissection is seen. Coronary artery calcification. Mild cardiomegaly. No large pericardial effusion. Mediastinum/Nodes: Trachea midline. No thyroid mass. No significantly enlarged lymph nodes. Circumferential distal esophageal wall thickening. Lungs/Pleura: Trace bilateral  effusions with hazy atelectasis in both bases. Partial atelectasis of the right middle lobe. No focal consolidation. Upper Abdomen: Partially visualized hypodense lesions in the upper pole of the left kidney inadequately characterized without contrast. Musculoskeletal: No chest wall abnormality. No acute or significant osseous findings. Review of the MIP images confirms the above findings. IMPRESSION: 1. Negative for acute pulmonary embolus or aortic dissection 2. Mild distal esophageal wall thickening suggestive of esophagitis 3. Trace pleural effusions and hazy bibasilar atelectasis. Electronically Signed   By: Jasmine Pang M.D.   On: 05/22/2017 23:40    Microbiology: No results found for this or any previous visit (from the past 240  hour(s)).   Labs: Basic Metabolic Panel:  Recent Labs Lab 05/22/17 1830 05/23/17 0427  NA 140 140  K 3.7 3.7  CL 104 106  CO2 26 25  GLUCOSE 155* 132*  BUN 16 15  CREATININE 0.93 0.93  CALCIUM 9.1 8.5*   Liver Function Tests:  Recent Labs Lab 05/22/17 1830  AST 22  ALT 33  ALKPHOS 86  BILITOT 0.7  PROT 7.1  ALBUMIN 3.8    Recent Labs Lab 05/22/17 1810  LIPASE 32   No results for input(s): AMMONIA in the last 168 hours. CBC:  Recent Labs Lab 05/22/17 1830 05/23/17 0427 05/24/17 0229  WBC 13.4* 12.6* 8.6  NEUTROABS  --  8.3*  --   HGB 13.8 12.2* 11.3*  HCT 41.5 37.3* 34.9*  MCV 89.2 89.9 90.2  PLT 263 231 213   Cardiac Enzymes:  Recent Labs Lab 05/23/17 0208 05/23/17 0427 05/23/17 0735  TROPONINI 0.03* 0.03* <0.03   BNP: BNP (last 3 results) No results for input(s): BNP in the last 8760 hours.  ProBNP (last 3 results) No results for input(s): PROBNP in the last 8760 hours.  CBG: No results for input(s): GLUCAP in the last 168 hours.   Signed:  Penny Pia MD.  [facility] 05/24/2017, 4:06 PM

## 2017-05-24 NOTE — Progress Notes (Signed)
Progress Note  Patient Name: Cleto Claggett Date of Encounter: 05/24/2017  Primary Cardiologist:   New  (Dr. Katrinka Blazing)  Subjective   Wants to eat.  Pain with swallowing.   Inpatient Medications    Scheduled Meds: . atorvastatin  20 mg Oral Daily  . cloNIDine  0.05 mg Oral BID  . FLUoxetine  40 mg Oral Daily  . gabapentin  800 mg Oral BID AC & HS  . lisinopril  20 mg Oral Daily  . pantoprazole (PROTONIX) IV  40 mg Intravenous Q12H  . QUEtiapine  100 mg Oral QHS  . tamsulosin  0.8 mg Oral Daily   Continuous Infusions: . sodium chloride 75 mL/hr at 05/24/17 0613   PRN Meds: acetaminophen, gi cocktail, hydrALAZINE, hydrOXYzine, morphine injection, ondansetron (ZOFRAN) IV, propranolol   Vital Signs    Vitals:   05/23/17 0424 05/23/17 1335 05/23/17 2003 05/24/17 0533  BP: 136/90 (!) 148/99 (!) 157/98   Pulse: 93 88 95 81  Resp: 18 18 18 18   Temp: 98.6 F (37 C) 98.3 F (36.8 C) 98.4 F (36.9 C) 98.6 F (37 C)  TempSrc: Oral Oral Oral Oral  SpO2: 95% 99% 98% 98%  Weight:      Height:        Intake/Output Summary (Last 24 hours) at 05/24/17 1002 Last data filed at 05/24/17 0900  Gross per 24 hour  Intake              645 ml  Output              550 ml  Net               95 ml   Filed Weights   05/22/17 1741 05/23/17 0159  Weight: 185 lb (83.9 kg) 177 lb 6.4 oz (80.5 kg)    Telemetry    NSR, sinus tach - Personally Reviewed  ECG    NA - Personally Reviewed  Physical Exam   GEN: No acute distress.   Neck: No  JVD Cardiac: RRR, no murmurs, rubs, or gallops.  Respiratory: Clear  to auscultation bilaterally. GI: Soft, nontender, non-distended  MS: No  edema; No deformity. Neuro:  Nonfocal  Psych: Normal affect   Labs    Chemistry Recent Labs Lab 05/22/17 1830 05/23/17 0427  NA 140 140  K 3.7 3.7  CL 104 106  CO2 26 25  GLUCOSE 155* 132*  BUN 16 15  CREATININE 0.93 0.93  CALCIUM 9.1 8.5*  PROT 7.1  --   ALBUMIN 3.8  --   AST 22  --   ALT  33  --   ALKPHOS 86  --   BILITOT 0.7  --   GFRNONAA >60 >60  GFRAA >60 >60  ANIONGAP 10 9     Hematology Recent Labs Lab 05/22/17 1830 05/23/17 0427 05/24/17 0229  WBC 13.4* 12.6* 8.6  RBC 4.65 4.15* 3.87*  HGB 13.8 12.2* 11.3*  HCT 41.5 37.3* 34.9*  MCV 89.2 89.9 90.2  MCH 29.7 29.4 29.2  MCHC 33.3 32.7 32.4  RDW 13.5 13.7 13.4  PLT 263 231 213    Cardiac Enzymes Recent Labs Lab 05/23/17 0208 05/23/17 0427 05/23/17 0735  TROPONINI 0.03* 0.03* <0.03    Recent Labs Lab 05/22/17 1839 05/23/17 0027  TROPIPOC 0.03 0.01     BNPNo results for input(s): BNP, PROBNP in the last 168 hours.   DDimer  Recent Labs Lab 05/22/17 2039  DDIMER 3.14*     Radiology  Dg Chest 2 View  Result Date: 05/22/2017 CLINICAL DATA:  Mid chest pain.  Nausea and vomiting. EXAM: CHEST  2 VIEW COMPARISON:  02/03/2016 FINDINGS: The patient has bibasilar atelectasis, more on the right than the left. The lungs are otherwise clear. Heart size and pulmonary vascularity are normal. No discrete effusions. No visible free air under the diaphragm. IMPRESSION: Bibasilar atelectasis. Electronically Signed   By: Francene BoyersJames  Maxwell M.D.   On: 05/22/2017 19:18   Ct Angio Chest Pe W Or Wo Contrast  Result Date: 05/22/2017 CLINICAL DATA:  Chest pain and vomiting EXAM: CT ANGIOGRAPHY CHEST WITH CONTRAST TECHNIQUE: Multidetector CT imaging of the chest was performed using the standard protocol during bolus administration of intravenous contrast. Multiplanar CT image reconstructions and MIPs were obtained to evaluate the vascular anatomy. CONTRAST:  100 cc Isovue 370 intravenous COMPARISON:  Radiograph 05/22/2017 FINDINGS: Cardiovascular: Satisfactory opacification of the pulmonary arteries to the segmental level. No evidence of pulmonary embolism. Non aneurysmal aorta. No dissection is seen. Coronary artery calcification. Mild cardiomegaly. No large pericardial effusion. Mediastinum/Nodes: Trachea midline. No  thyroid mass. No significantly enlarged lymph nodes. Circumferential distal esophageal wall thickening. Lungs/Pleura: Trace bilateral effusions with hazy atelectasis in both bases. Partial atelectasis of the right middle lobe. No focal consolidation. Upper Abdomen: Partially visualized hypodense lesions in the upper pole of the left kidney inadequately characterized without contrast. Musculoskeletal: No chest wall abnormality. No acute or significant osseous findings. Review of the MIP images confirms the above findings. IMPRESSION: 1. Negative for acute pulmonary embolus or aortic dissection 2. Mild distal esophageal wall thickening suggestive of esophagitis 3. Trace pleural effusions and hazy bibasilar atelectasis. Electronically Signed   By: Jasmine PangKim  Fujinaga M.D.   On: 05/22/2017 23:40    Cardiac Studies   ECHO  04/23/17 Study Conclusions  - Left ventricle: The cavity size was normal. There was mild   concentric hypertrophy. Systolic function was normal. The   estimated ejection fraction was in the range of 60% to 65%. Wall   motion was normal; there were no regional wall motion   abnormalities. Doppler parameters are consistent with abnormal   left ventricular relaxation (grade 1 diastolic dysfunction). - Tricuspid valve: There was mild regurgitation.  Patient Profile     56 y.o. male with a past medical history significant for hypertension, depression and anxiety who is being seen for the evaluation of chest pain at the request of Dr. Catha GosselinMikhail  Assessment & Plan    CHEST PAIN:  Felt to be atypical.  He does have coronary calcium and we will arrange out patient follow up POET (Pl[facility] [place] Exercise Treadmill).  No further in patient work up.  Please call with further questions.    Signed, Rollene RotundaJames Darris Staiger, MD  05/24/2017, 10:02 AM

## 2017-05-26 ENCOUNTER — Telehealth: Payer: Self-pay | Admitting: *Deleted

## 2017-05-26 ENCOUNTER — Other Ambulatory Visit: Payer: Self-pay | Admitting: Physician Assistant

## 2017-05-26 DIAGNOSIS — R079 Chest pain, unspecified: Secondary | ICD-10-CM

## 2017-05-26 NOTE — Telephone Encounter (Signed)
GXT scheduled 6/15 at 2pm.

## 2017-05-26 NOTE — Telephone Encounter (Signed)
Cline Crockhompson, Kathryn R, PA-C  P Cv Div [facility] Triage        Needs exercise stress test set up. I have placed order for this. Next 1-2 weeks would be good.     Called patient and given instructions for GXT.   Advised I would have someone call to get scheduled.  Patient aware and verbalized understanding.

## 2017-05-31 ENCOUNTER — Encounter: Payer: Self-pay | Admitting: Gastroenterology

## 2017-05-31 ENCOUNTER — Ambulatory Visit (INDEPENDENT_AMBULATORY_CARE_PROVIDER_SITE_OTHER): Payer: Medicaid Other | Admitting: Gastroenterology

## 2017-05-31 VITALS — BP 118/90 | HR 86 | Ht 68.5 in | Wt 178.4 lb

## 2017-05-31 DIAGNOSIS — R938 Abnormal findings on diagnostic imaging of other specified body structures: Secondary | ICD-10-CM | POA: Diagnosis not present

## 2017-05-31 DIAGNOSIS — R066 Hiccough: Secondary | ICD-10-CM | POA: Diagnosis not present

## 2017-05-31 DIAGNOSIS — R1310 Dysphagia, unspecified: Secondary | ICD-10-CM

## 2017-05-31 DIAGNOSIS — R9389 Abnormal findings on diagnostic imaging of other specified body structures: Secondary | ICD-10-CM | POA: Insufficient documentation

## 2017-05-31 DIAGNOSIS — K920 Hematemesis: Secondary | ICD-10-CM | POA: Diagnosis not present

## 2017-05-31 MED ORDER — PANTOPRAZOLE SODIUM 40 MG PO TBEC
40.0000 mg | DELAYED_RELEASE_TABLET | Freq: Two times a day (BID) | ORAL | 11 refills | Status: DC
Start: 2017-05-31 — End: 2017-12-04

## 2017-05-31 NOTE — Patient Instructions (Signed)
We have given you a prescription for Pantoprazole 40 mg twice a day.    [facility] Your appointment is on 06-06-17 at 9:30 am. Please arrive 15 minutes prior to your test for registration. Make certain not to have anything to eat or drink after midnight on the night before your test. If you need to reschedule, please contact radiology at [phone]. --------------------------------------------------------------------------------------------------------------- An upper GI series uses x rays to help diagnose problems of the upper GI tract, which includes the esophagus, stomach, and duodenum. The duodenum is the first part of the small intestine. An upper GI series is conducted by a radiology technologist or a radiologist-a doctor who specializes in x-ray imaging-at a hospital or outpatient center. While sitting or standing in front of an x-ray machine, the patient drinks barium liquid, which is often white and has a chalky consistency and taste. The barium liquid coats the lining of the upper GI tract and makes signs of disease show up more clearly on x rays. X-ray video, called fluoroscopy, is used to view the barium liquid moving through the esophagus, stomach, and duodenum. Additional x rays and fluoroscopy are performed while the patient lies on an x-ray table. To fully coat the upper GI tract with barium liquid, the technologist or radiologist may press on the abdomen or ask the patient to change position. Patients hold still in various positions, allowing the technologist or radiologist to take x rays of the upper GI tract at different angles. If a technologist conducts the upper GI series, a radiologist will later examine the images to look for problems.  This test typically takes about 1 hour to  complete --------------------------------------------------------------------------------------------------------------------------------------------- The Small Bowel Follow Thru examination is used to visualize the entire small bowel (intestines); specifically the connection between the small and large intestine. You will be positioned on a flat x-ray table and an image of your abdomen taken. Then the technologist will show the x-ray to the radiologist. The radiologist will instruct your technologist how much (1-2 cups) barium sulfate you will drink and when to begin taking the timed x-rays, usually 15-30 minutes after you begin drinking. Barium is a harmless substance that will highlight your small intestine by absorbing x-ray. The taste is chalky and it feels very heavy both in the cup and in your stomach.  After the first x-ray is taken and shown to the radiologist, he/she will determine when the next image is to be taken. This is repeated until the barium has reached the end of the small intestine and enters the beginning of the colon (cecum). At such time when the barium spills into the colon, you will be positioned on the x-ray table once again. The radiologist will use a fluoroscopic camera to take some detailed pictures of the connection between your small intestine and colon. The fluoroscope is an x-ray unit that works with a television/computer screen. The radiologist will apply pressure to your abdomen with his/her hand and a lead glove, a plastic paddle, or a paddle with an inflated rubber balloon on the end. This is to spread apart your loops of intestine so he/she can see all areas.   This test typically takes around 1 hour to complete.  Important: Drink plenty of water (8-10 cups/day) for a few days following the procedure to avoid constipation and blockage. The barium will make your stools white for a few  days. --------------------------------------------------------------------------------------------------------------------------------------------

## 2017-05-31 NOTE — Progress Notes (Addendum)
05/31/2017 Zach[facility] [id number] [birth year]   HISTORY OF PRESENT ILLNESS:  This is a 56 year old male who is new to our office.  He was recently hospitalized for chest pain and hematemesis.  Was seen by cardiology and the chest pain was thought to be atypical although he is scheduled to have an exercise stress test later this week for follow-up.  He had a CT angio of the chest and that showed some mild distal esophageal wall thickening suspicious for esophagitis.  Was told to follow-up here.  He tells me that around the time of his chest pain he had also had 3 or 4 episodes where he vomited a moderate amount of blood. He said that he has had a lot of hiccups on and off for about 4 days as well. Also has been experiencing some difficulty swallowing.  Says that it does not feel completely obstructed and stuff is not getting stuck, it is just harder to get it down.  At the time of hospital discharge he was placed on pantoprazole 40 mg daily and told to follow-up here.  He denies ever really having any significant complaints of heartburn/reflux.  He's never had colonoscopy in the past.    Referred here by Dr. Catha Gosselin, [facility].   Past Medical History:  Diagnosis Date  . Anxiety   . Asperger syndrome   . Depression   . H/O suicide attempt    overdose "many years ago"  . Homelessness   . Hypertension   . Prostate disorder   . Restless leg syndrome    No past surgical history on file.  reports that he has quit smoking. He quit after 2.00 years of use. He has never used smokeless tobacco. He reports that he uses drugs, including Marijuana. He reports that he does not drink alcohol. He was adopted. Family history is unknown by patient. Allergies  Allergen Reactions  . Amoxicillin Other (See Comments)    Ineffective (patient states his is resistant)  . Cephalexin Nausea And Vomiting  . Lemon Oil Itching  . Tape Other (See Comments)    Please use "paper" tape only.  Irritation, redness  .  Codeine Itching      Outpatient Encounter Prescriptions as of 05/31/2017  Medication Sig  . atorvastatin (LIPITOR) 20 MG tablet Take 1 tablet (20 mg total) by mouth daily.  . cetirizine (ZYRTEC) 10 MG tablet Take 10 mg by mouth daily as needed for allergies.  . clonazePAM (KLONOPIN) 0.5 MG tablet Take 1 tablet (0.5 mg total) by mouth at bedtime. (Patient taking differently: Take 1 mg by mouth at bedtime. )  . cloNIDine (CATAPRES) 0.1 MG tablet Take 0.5 tablets (0.05 mg total) by mouth 2 (two) times daily. (Patient taking differently: Take 0.2 mg by mouth daily. )  . FLUoxetine (PROZAC) 40 MG capsule Take 1 capsule (40 mg total) by mouth daily.  Marland Kitchen gabapentin (NEURONTIN) 400 MG capsule Take 2 capsules (800 mg total) by mouth 2 (two) times daily at 8 am and 10 pm.  . hydrOXYzine (ATARAX/VISTARIL) 50 MG tablet Take 1 tablet (50 mg total) by mouth every 8 (eight) hours as needed for anxiety.  Marland Kitchen lisinopril (PRINIVIL,ZESTRIL) 20 MG tablet Take 1 tablet (20 mg total) by mouth daily.  . montelukast (SINGULAIR) 10 MG tablet Take 10 mg by mouth at bedtime.  . ondansetron (ZOFRAN-ODT) 4 MG disintegrating tablet DISSOLVE 1 TABLET IN MOUTH EVERY 8 HOURS AS NEEDED FOR NAUSEA OR VOMITING  . pantoprazole (PROTONIX)  40 MG tablet Take 1 tablet (40 mg total) by mouth daily.  . propranolol (INDERAL) 20 MG tablet Take 1 tablet (20 mg total) by mouth 2 (two) times daily as needed (anxiety). (Patient taking differently: Take 20 mg by mouth daily. )  . QUEtiapine (SEROQUEL) 100 MG tablet Take 1 tablet (100 mg total) by mouth at bedtime. (Patient taking differently: Take 200 mg by mouth at bedtime. )  . tamsulosin (FLOMAX) 0.4 MG CAPS capsule Take 2 capsules (0.8 mg total) by mouth daily.  Marland Kitchen. terbinafine (LAMISIL AT) 1 % cream Apply 1 application topically 2 (two) times daily.  Marland Kitchen. amLODipine (NORVASC) 5 MG tablet Take 5 mg by mouth daily.   No facility-administered encounter medications on file as of 05/31/2017.       REVIEW OF SYSTEMS  : All other systems reviewed and negative except where noted in the History of Present Illness.   PHYSICAL EXAM: BP 118/90   Pulse 86   Ht 5' 8.5" (1.74 m)   Wt 178 lb 6.4 oz (80.9 kg)   BMI 26.73 kg/m  General: Well developed white male in no acute distress Head: Normocephalic and atraumatic Eyes:  Sclerae anicteric, conjunctiva pink. Ears: Normal auditory acuity Lungs: Clear throughout to auscultation; no increased WOB. Heart: Regular rate and rhythm Abdomen: Soft, non-distended.  BS present.  Non-tender. Musculoskeletal: Symmetrical with no gross deformities  Skin: No lesions on visible extremities Extremities: No edema  Neurological: Alert oriented x 4, grossly non-focal Psychological:  Alert and cooperative. Normal mood and affect  ASSESSMENT AND PLAN: -Dysphagia, hematemesis, and abnormal esophagram showing esophageal wall thickening suggestive of esophagitis.  Hematemesis resolved after the acute episodes that he had a couple of weeks ago. -Screening for colon cancer:  Never had colonoscopy in the past.  *I have discussed with him regarding EGD and colonoscopy.  He is adamant that he does not have anyone who can come with him for the procedures.  We are going to post-pone colonoscopy for now until he sees if he can get a care partner.  I am going to increase his PPI to twice daily for now and we will follow-up those issues with and UGI series in about one month for now.   CC:  Jaclyn ShaggyAmao, Enobong, MD   Addendum: Reviewed and agree with initial management. Hopefully he will be able to find transportation as these procedures strongly recommend/advised. Pyrtle, Carie CaddyJay M, MD

## 2017-06-06 ENCOUNTER — Ambulatory Visit (HOSPITAL_COMMUNITY): Payer: Medicaid Other

## 2017-06-07 ENCOUNTER — Telehealth: Payer: Self-pay | Admitting: Physician Assistant

## 2017-06-07 NOTE — Telephone Encounter (Signed)
Spoke with patient at this time he is declining services.  He was upset that he did not get a reminder call. I have removed the order from the workque at this time. The appt was made on 06/8 for a 06/15 appt.   Zachary LaundrySonya

## 2017-07-03 ENCOUNTER — Ambulatory Visit (HOSPITAL_COMMUNITY): Payer: Medicaid Other

## 2017-07-19 ENCOUNTER — Other Ambulatory Visit: Payer: Self-pay | Admitting: Family Medicine

## 2017-07-19 DIAGNOSIS — I10 Essential (primary) hypertension: Secondary | ICD-10-CM

## 2017-07-19 DIAGNOSIS — N400 Enlarged prostate without lower urinary tract symptoms: Secondary | ICD-10-CM

## 2017-07-27 ENCOUNTER — Ambulatory Visit (HOSPITAL_COMMUNITY)
Admission: RE | Admit: 2017-07-27 | Discharge: 2017-07-27 | Disposition: A | Discharge status: Home / Self Care | Payer: Medicaid Other | Source: Ambulatory Visit | Attending: Gastroenterology | Admitting: Gastroenterology

## 2017-07-27 ENCOUNTER — Encounter (HOSPITAL_COMMUNITY): Payer: Self-pay

## 2017-07-27 DIAGNOSIS — R9389 Abnormal findings on diagnostic imaging of other specified body structures: Secondary | ICD-10-CM

## 2017-07-27 DIAGNOSIS — R1310 Dysphagia, unspecified: Secondary | ICD-10-CM

## 2017-07-27 DIAGNOSIS — K920 Hematemesis: Secondary | ICD-10-CM

## 2017-08-08 ENCOUNTER — Encounter (HOSPITAL_COMMUNITY): Payer: Self-pay | Admitting: *Deleted

## 2017-08-08 DIAGNOSIS — Z590 Homelessness: Secondary | ICD-10-CM | POA: Insufficient documentation

## 2017-08-08 DIAGNOSIS — I10 Essential (primary) hypertension: Secondary | ICD-10-CM | POA: Diagnosis not present

## 2017-08-08 DIAGNOSIS — F329 Major depressive disorder, single episode, unspecified: Secondary | ICD-10-CM | POA: Diagnosis not present

## 2017-08-08 DIAGNOSIS — R002 Palpitations: Secondary | ICD-10-CM | POA: Insufficient documentation

## 2017-08-08 DIAGNOSIS — Z87891 Personal history of nicotine dependence: Secondary | ICD-10-CM | POA: Diagnosis not present

## 2017-08-08 DIAGNOSIS — F419 Anxiety disorder, unspecified: Secondary | ICD-10-CM | POA: Diagnosis present

## 2017-08-08 NOTE — ED Triage Notes (Signed)
Pt arrives ambulatory with EMS from home. Pt has hx of anxiety. Increased anxiety/stress over several days. En route, vitals 142/110, hr 110, 98%ra, cbg 148. Pt states that he has had increased anxiety, did not taken any home medication for the same, only takes klonopin at night to help him sleep. Also c/o sinus congestion. DENIES SI/HI, HALLUCINATIONS.

## 2017-08-09 ENCOUNTER — Emergency Department (HOSPITAL_COMMUNITY)
Admission: EM | Admit: 2017-08-09 | Discharge: 2017-08-09 | Disposition: A | Discharge status: Home / Self Care | Payer: Medicaid Other | Attending: Emergency Medicine | Admitting: Emergency Medicine

## 2017-08-09 ENCOUNTER — Emergency Department (HOSPITAL_COMMUNITY): Payer: Medicaid Other

## 2017-08-09 DIAGNOSIS — R002 Palpitations: Secondary | ICD-10-CM

## 2017-08-09 DIAGNOSIS — F419 Anxiety disorder, unspecified: Secondary | ICD-10-CM

## 2017-08-09 LAB — CBC WITH DIFFERENTIAL/PLATELET
BASOS PCT: 0 %
Basophils Absolute: 0 10*3/uL (ref 0.0–0.1)
EOS ABS: 0.1 10*3/uL (ref 0.0–0.7)
Eosinophils Relative: 1 %
HCT: 49.6 % (ref 39.0–52.0)
HEMOGLOBIN: 16.6 g/dL (ref 13.0–17.0)
Lymphocytes Relative: 39 %
Lymphs Abs: 4.3 10*3/uL — ABNORMAL HIGH (ref 0.7–4.0)
MCH: 29.6 pg (ref 26.0–34.0)
MCHC: 33.5 g/dL (ref 30.0–36.0)
MCV: 88.4 fL (ref 78.0–100.0)
MONOS PCT: 6 %
Monocytes Absolute: 0.7 10*3/uL (ref 0.1–1.0)
NEUTROS PCT: 54 %
Neutro Abs: 6 10*3/uL (ref 1.7–7.7)
PLATELETS: 258 10*3/uL (ref 150–400)
RBC: 5.61 MIL/uL (ref 4.22–5.81)
RDW: 13.7 % (ref 11.5–15.5)
WBC: 11.1 10*3/uL — AB (ref 4.0–10.5)

## 2017-08-09 LAB — BASIC METABOLIC PANEL
Anion gap: 10 (ref 5–15)
BUN: 14 mg/dL (ref 6–20)
CHLORIDE: 102 mmol/L (ref 101–111)
CO2: 27 mmol/L (ref 22–32)
CREATININE: 1.32 mg/dL — AB (ref 0.61–1.24)
Calcium: 9.7 mg/dL (ref 8.9–10.3)
GFR, EST NON AFRICAN AMERICAN: 60 mL/min — AB (ref >60)
Glucose, Bld: 139 mg/dL — ABNORMAL HIGH (ref 65–99)
Potassium: 4.5 mmol/L (ref 3.5–5.1)
SODIUM: 139 mmol/L (ref 135–145)

## 2017-08-09 LAB — I-STAT TROPONIN, ED: Troponin i, poc: 0.01 ng/mL (ref 0.00–0.08)

## 2017-08-09 NOTE — Discharge Instructions (Signed)
You were seen today for palpitations and anxiety. Your workup is reassuring. Follow-up with her primary physician regarding additional medications for daily anxiety. Take Klonopin as directed.

## 2017-08-09 NOTE — ED Notes (Signed)
Pt reports giving a knife to security; informed security of pt's discharge

## 2017-08-09 NOTE — ED Provider Notes (Signed)
[facility]-EMERGENCY DEPT Provider Note   CSN: [id number] Arrival date & time: 08/08/17  2020     History   Chief Complaint Chief Complaint  Patient presents with  . Anxiety    HPI Zachary Russo is a 56 y.o. male.  HPI  This is a 22 are old male with history of anxiety, asperger syndrome, hypertension who presents with increasing anxiety symptoms. Patient states that over the last several days he has had worsening symptoms of anxiety. He reports multiple stressors at home. Symptoms include palpitations. He denies chest pain or shortness of breath. He does take Klonopin but only at night and is not taking anything during the day for anxiety. Patient states that he is concerned that "this might be more than anxiety." He does take propranolol for palpitations but states some of the symptoms are similar to when he had palpitations previously. He denies any recent illnesses, fevers, nausea vomiting. Patient denies SI or HI.  Past Medical History:  Diagnosis Date  . Anxiety   . Asperger syndrome   . Depression   . H/O suicide attempt    overdose "many years ago"  . Homelessness   . Hypertension   . Prostate disorder   . Restless leg syndrome     Patient Active Problem List   Diagnosis Date Noted  . Dysphagia 05/31/2017  . Abnormal finding on imaging 05/31/2017  . Hematemesis without nausea 05/31/2017  . Hiccups 05/31/2017  . Chest pain 05/23/2017  . Nausea and vomiting 05/23/2017  . Hyperlipidemia 02/21/2017  . Insomnia 02/20/2017  . Asperger syndrome 02/06/2017  . Benign prostatic hyperplasia 02/06/2017  . 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 Major depressive disorder, recurrent episode (HCC) 07/07/2015  . Anxiety and depression 01/27/2015  . Adjustment disorder with depressed mood 01/18/2015  . Acute encephalopathy 12/18/2013  . Delirium 12/18/2013  . Essential hypertension 12/18/2013  . Abnormal brain CT 12/18/2013     History reviewed. No pertinent surgical history.     Home Medications    Prior to Admission medications   Medication Sig Start Date End Date Taking? Authorizing Provider  amLODipine (NORVASC) 5 MG tablet Take 5 mg by mouth daily.    [provider]  atorvastatin (LIPITOR) 20 MG tablet Take 1 tablet (20 mg total) by mouth daily. 02/21/17   Jaclyn Shaggy, MD  cetirizine (ZYRTEC) 10 MG tablet Take 10 mg by mouth daily as needed for allergies.    [provider]  clonazePAM (KLONOPIN) 0.5 MG tablet Take 1 tablet (0.5 mg total) by mouth at bedtime. Patient taking differently: Take 1 mg by mouth at bedtime.  01/24/17   Ward, Layla Maw, DO  cloNIDine (CATAPRES) 0.1 MG tablet Take 0.5 tablets (0.05 mg total) by mouth 2 (two) times daily. Patient taking differently: Take 0.2 mg by mouth daily.  02/20/17   Jaclyn Shaggy, MD  FLUoxetine (PROZAC) 40 MG capsule Take 1 capsule (40 mg total) by mouth daily. 02/20/17   Jaclyn Shaggy, MD  gabapentin (NEURONTIN) 400 MG capsule TAKE 2 CAPSULES BY MOUTH TWICE DAILY AT  8  AM  AND  10 PM 07/20/17   Jaclyn Shaggy, MD  hydrOXYzine (ATARAX/VISTARIL) 50 MG tablet Take 1 tablet (50 mg total) by mouth every 8 (eight) hours as needed for anxiety. 02/20/17   Jaclyn Shaggy, MD  lisinopril (PRINIVIL,ZESTRIL) 20 MG tablet Take 1 tablet (20 mg total) by mouth daily. 02/20/17   Jaclyn Shaggy, MD  montelukast (SINGULAIR) 10 MG tablet  Take 10 mg by mouth at bedtime.    [provider]  ondansetron (ZOFRAN-ODT) 4 MG disintegrating tablet DISSOLVE 1 TABLET IN MOUTH EVERY 8 HOURS AS NEEDED FOR NAUSEA OR VOMITING 05/22/17   Jaclyn Shaggy, MD  pantoprazole (PROTONIX) 40 MG tablet Take 1 tablet (40 mg total) by mouth 2 (two) times daily before a meal. 05/31/17 05/31/18  Zehr, Shanda Bumps D, PA-C  propranolol (INDERAL) 20 MG tablet TAKE 1 TABLET BY MOUTH TWICE DAILY AS NEEDED FOR ANXIETY 07/20/17   Jaclyn Shaggy, MD  QUEtiapine (SEROQUEL) 100 MG tablet Take 1 tablet (100 mg  total) by mouth at bedtime. Patient taking differently: Take 200 mg by mouth at bedtime.  02/20/17   Jaclyn Shaggy, MD  tamsulosin (FLOMAX) 0.4 MG CAPS capsule TAKE 2 CAPSULES BY MOUTH  DAILY 07/20/17   Jaclyn Shaggy, MD  terbinafine (LAMISIL AT) 1 % cream Apply 1 application topically 2 (two) times daily. 02/06/17   Jaclyn Shaggy, MD    Family History Family History  Problem Relation Age of Onset  . Adopted: Yes  . Family history unknown: Yes    Social History Social History  Substance Use Topics  . Smoking status: Former Smoker    Years: 2.00  . Smokeless tobacco: Never Used  . Alcohol use No     Allergies   Amoxicillin; Cephalexin; Lemon oil; Tape; and Codeine   Review of Systems Review of Systems  Constitutional: Negative for fever.  Respiratory: Negative for shortness of breath.   Cardiovascular: Positive for palpitations. Negative for chest pain.  Gastrointestinal: Negative for abdominal pain, nausea and vomiting.  Psychiatric/Behavioral: The patient is nervous/anxious.   All other systems reviewed and are negative.    Physical Exam Updated Vital Signs BP (!) 134/107   Pulse 91   Temp 99 F (37.2 C) (Oral)   Resp 16   SpO2 96%   Physical Exam  Constitutional: He is oriented to person, place, and time. He appears well-developed and well-nourished. No distress.  HENT:  Head: Normocephalic and atraumatic.  Cardiovascular: Normal rate, regular rhythm and normal heart sounds.   No murmur heard. Pulmonary/Chest: Effort normal and breath sounds normal. No respiratory distress. He has no wheezes.  Abdominal: Soft. There is no tenderness.  Neurological: He is alert and oriented to person, place, and time.  Skin: Skin is warm and dry.  Psychiatric:  Flat affect  Nursing note and vitals reviewed.    ED Treatments / Results  Labs (all labs ordered are listed, but only abnormal results are displayed) Labs Reviewed  CBC WITH DIFFERENTIAL/PLATELET - Abnormal;  Notable for the following:       Result Value   WBC 11.1 (*)    Lymphs Abs 4.3 (*)    All other components within normal limits  BASIC METABOLIC PANEL - Abnormal; Notable for the following:    Glucose, Bld 139 (*)    Creatinine, Ser 1.32 (*)    GFR calc non Af Amer 60 (*)    All other components within normal limits  I-STAT TROPONIN, ED    EKG  EKG Interpretation  Date/Time:  Wednesday August 09 2017 01:59:28 EDT Ventricular Rate:  98 PR Interval:    QRS Duration: 94 QT Interval:  330 QTC Calculation: 422 R Axis:   97 Text Interpretation:  Sinus rhythm Inferior infarct, old No significant change since last tracing Confirmed by Ross Marcus ([id number]) on 08/09/2017 2:16:16 AM       Radiology Dg Chest 2 View  Result  Date: 08/09/2017 CLINICAL DATA:  Palpitations. EXAM: CHEST  2 VIEW COMPARISON:  Radiographs and CT 05/22/2017 FINDINGS: Unchanged heart size and mediastinal contours. Improved bibasilar atelectasis. No pulmonary edema, consolidation, pleural effusion or pneumothorax. No acute osseous abnormalities. IMPRESSION: Improved bibasilar atelectasis.  No acute abnormality. Electronically Signed   By: Rubye Oaks M.D.   On: 08/09/2017 01:41    Procedures Procedures (including critical care time)  Medications Ordered in ED Medications - No data to display   Initial Impression / Assessment and Plan / ED Course  I have reviewed the triage vital signs and the nursing notes.  Pertinent labs & imaging results that were available during my care of the patient were reviewed by me and considered in my medical decision making (see chart for details).     Patient presents with concerns for worsening anxiety. He is nontoxic overall on exam. Vital signs are reassuring. He describes palpitations without chest pain or shortness of breath. Currently on propranolol. Rate is controlled. EKG is nonischemic without arrhythmia. Basic labwork reviewed without significant metabolic  derangement. Negative troponin. Patient was reassured. I have referred him back to his primary physician for discussion regarding additional medications for anxiety.  After history, exam, and medical workup I feel the patient has been appropriately medically screened and is safe for discharge home. Pertinent diagnoses were discussed with the patient. Patient was given return precautions.   Final Clinical Impressions(s) / ED Diagnoses   Final diagnoses:  Palpitations  Anxiety    New Prescriptions New Prescriptions   No medications on file     Shon Baton, MD 08/09/17 0222

## 2017-08-28 ENCOUNTER — Ambulatory Visit: Payer: Medicaid Other | Attending: Family Medicine | Admitting: Family Medicine

## 2017-08-28 ENCOUNTER — Ambulatory Visit: Payer: Medicaid Other | Attending: Family Medicine | Admitting: Licensed Clinical Social Worker

## 2017-08-28 ENCOUNTER — Encounter: Payer: Self-pay | Admitting: Family Medicine

## 2017-08-28 ENCOUNTER — Ambulatory Visit: Payer: Self-pay | Admitting: Family Medicine

## 2017-08-28 VITALS — BP 117/77 | HR 73 | Temp 97.7°F | Ht 68.5 in | Wt 185.0 lb

## 2017-08-28 DIAGNOSIS — F32A Depression, unspecified: Secondary | ICD-10-CM

## 2017-08-28 DIAGNOSIS — F419 Anxiety disorder, unspecified: Principal | ICD-10-CM

## 2017-08-28 DIAGNOSIS — Z79899 Other long term (current) drug therapy: Secondary | ICD-10-CM | POA: Diagnosis not present

## 2017-08-28 DIAGNOSIS — K219 Gastro-esophageal reflux disease without esophagitis: Secondary | ICD-10-CM | POA: Insufficient documentation

## 2017-08-28 DIAGNOSIS — Z888 Allergy status to other drugs, medicaments and biological substances status: Secondary | ICD-10-CM | POA: Diagnosis not present

## 2017-08-28 DIAGNOSIS — N400 Enlarged prostate without lower urinary tract symptoms: Secondary | ICD-10-CM | POA: Insufficient documentation

## 2017-08-28 DIAGNOSIS — F329 Major depressive disorder, single episode, unspecified: Secondary | ICD-10-CM | POA: Diagnosis not present

## 2017-08-28 DIAGNOSIS — E7800 Pure hypercholesterolemia, unspecified: Secondary | ICD-10-CM | POA: Insufficient documentation

## 2017-08-28 DIAGNOSIS — Z1211 Encounter for screening for malignant neoplasm of colon: Secondary | ICD-10-CM

## 2017-08-28 DIAGNOSIS — I10 Essential (primary) hypertension: Secondary | ICD-10-CM | POA: Diagnosis not present

## 2017-08-28 DIAGNOSIS — F845 Asperger's syndrome: Secondary | ICD-10-CM | POA: Diagnosis not present

## 2017-08-28 DIAGNOSIS — Z880 Allergy status to penicillin: Secondary | ICD-10-CM | POA: Insufficient documentation

## 2017-08-28 MED ORDER — LISINOPRIL 20 MG PO TABS: 20.0000 mg | ORAL_TABLET | Freq: Every day | ORAL | 5 refills | Status: DC

## 2017-08-28 MED ORDER — ATORVASTATIN CALCIUM 20 MG PO TABS: 20.0000 mg | ORAL_TABLET | Freq: Every day | ORAL | 5 refills | Status: DC

## 2017-08-28 NOTE — BH Specialist Note (Signed)
Integrated Behavioral Health Initial Visit  MRN: [id number] Name: Zachary Russo   Session Start time: 11:50 AM Session End time: 12:20 PM Total time: 30 minutes  Type of Service: Integrated Behavioral Health- Individual/Family Interpretor:No. Interpretor Name and Language: N/A   Warm Hand Off Completed.       SUBJECTIVE: Zachary Russo is a 56 y.o. male accompanied by patient. Patient was referred by Dr. Venetia NightAmao for depression and anxiety. Patient reports the following symptoms/concerns: overwhelming feelings of sadness and worry, trouble falling asleep and sleeping too much, low energy, feeling bad about self, difficulty concentrating, irritability, and hx of suicidal ideations Duration of problem: Ongoing; Severity of problem: severe  OBJECTIVE: Mood: Anxious and Affect: Appropriate Risk of harm to self or others: No plan to harm self or others   LIFE CONTEXT: Family and Social: Pt receives emotional support from friend and neighbors. He does not have any family School/Work: Pt receives SSI ($750) Self-Care: [facility]. He smokes marijuana "here and there" Life Changes: Pt reports financial strain and transportation barriers  GOALS ADDRESSED: Patient will reduce symptoms of: anxiety and depression and increase knowledge and/or ability of: coping skills and also: Increase adequate support systems for patient/family and Improve medication compliance   INTERVENTIONS: Solution-Focused Strategies, Supportive Counseling, Psychoeducation and/or Health Education and Link to [employer]  Standardized Assessments completed: GAD-7 and PHQ 2&9 with C-SSRS  ASSESSMENT: Patient currently experiencing depression and anxiety triggered by financial strain. He has a hx of Aspergers syndrome, MDD, and GAD. Pt reports hx of SI; however, denies plan or intent to harm self or others. He denied having any weapons in the home. LCSWA discussed  importance of establishing a safety plan and provided pt with crisis intervention resources. Patient may benefit from psychotherapy. [facility] and is open to counseling. LCSWA provided pt with employment resources and information on how to register for SCAT and Medicaid transportation to assist with barriers on getting to appointments.  PLAN: 1. Follow up with behavioral health clinician on : Pt was encouraged to contact LCSWA if symptoms worsen or fail to improve to [facility]. 2. Behavioral recommendations: LCSWA recommends that pt apply healthy coping skills discussed, comply with medication management, and utilize provided resources. Pt is encouraged to schedule follow up appointment with LCSWA 3. Referral(s): [facility] ([facility]) and [facility]:  Interior and spatial designerinances and Transportation 4. "From scale of 1-10, how likely are you to follow plan?": 8/10  Bridgett LarssonJasmine D Lewis, LCSW 08/29/17 4:24 PM

## 2017-08-28 NOTE — Patient Instructions (Signed)
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
## 2017-10-18 ENCOUNTER — Encounter: Payer: Self-pay | Admitting: Family Medicine

## 2017-10-31 ENCOUNTER — Encounter (HOSPITAL_COMMUNITY): Payer: Self-pay | Admitting: Emergency Medicine

## 2017-10-31 ENCOUNTER — Other Ambulatory Visit: Payer: Self-pay

## 2017-10-31 DIAGNOSIS — Z79899 Other long term (current) drug therapy: Secondary | ICD-10-CM | POA: Diagnosis not present

## 2017-10-31 DIAGNOSIS — Z87891 Personal history of nicotine dependence: Secondary | ICD-10-CM | POA: Diagnosis not present

## 2017-10-31 DIAGNOSIS — R45851 Suicidal ideations: Secondary | ICD-10-CM | POA: Insufficient documentation

## 2017-10-31 DIAGNOSIS — I10 Essential (primary) hypertension: Secondary | ICD-10-CM | POA: Diagnosis not present

## 2017-10-31 DIAGNOSIS — F411 Generalized anxiety disorder: Secondary | ICD-10-CM | POA: Diagnosis not present

## 2017-10-31 LAB — SALICYLATE LEVEL

## 2017-10-31 LAB — RAPID URINE DRUG SCREEN, HOSP PERFORMED
Amphetamines: NOT DETECTED
BARBITURATES: NOT DETECTED
Benzodiazepines: NOT DETECTED
COCAINE: NOT DETECTED
Opiates: NOT DETECTED
Tetrahydrocannabinol: POSITIVE — AB

## 2017-10-31 LAB — CBC
HEMATOCRIT: 38.5 % — AB (ref 39.0–52.0)
Hemoglobin: 12.3 g/dL — ABNORMAL LOW (ref 13.0–17.0)
MCH: 28.2 pg (ref 26.0–34.0)
MCHC: 31.9 g/dL (ref 30.0–36.0)
MCV: 88.3 fL (ref 78.0–100.0)
PLATELETS: 235 10*3/uL (ref 150–400)
RBC: 4.36 MIL/uL (ref 4.22–5.81)
RDW: 16.4 % — AB (ref 11.5–15.5)
WBC: 7.9 10*3/uL (ref 4.0–10.5)

## 2017-10-31 LAB — COMPREHENSIVE METABOLIC PANEL
ALK PHOS: 86 U/L (ref 38–126)
ALT: 41 U/L (ref 17–63)
ANION GAP: 6 (ref 5–15)
AST: 27 U/L (ref 15–41)
Albumin: 3.7 g/dL (ref 3.5–5.0)
BILIRUBIN TOTAL: 0.5 mg/dL (ref 0.3–1.2)
BUN: 12 mg/dL (ref 6–20)
CALCIUM: 8.5 mg/dL — AB (ref 8.9–10.3)
CO2: 24 mmol/L (ref 22–32)
Chloride: 108 mmol/L (ref 101–111)
Creatinine, Ser: 1.3 mg/dL — ABNORMAL HIGH (ref 0.61–1.24)
GLUCOSE: 215 mg/dL — AB (ref 65–99)
POTASSIUM: 4.4 mmol/L (ref 3.5–5.1)
Sodium: 138 mmol/L (ref 135–145)
TOTAL PROTEIN: 6.1 g/dL — AB (ref 6.5–8.1)

## 2017-10-31 LAB — ACETAMINOPHEN LEVEL

## 2017-10-31 LAB — ETHANOL

## 2017-10-31 NOTE — ED Notes (Signed)
Staffing made aware of need for sitter assignment

## 2017-10-31 NOTE — ED Triage Notes (Signed)
Pt presents to ED for assessment for continuing anxiety since June.  Pt has primary psychiatrist, as well as has seen two other psychiatrists since, but states he is not being given the medication control he would like, and does not agree with multiple psychiatrists assessment of his anxiety medications and antidepressants.  Pt continues to states in triage that "I do not have a problem with benzos".

## 2017-10-31 NOTE — ED Notes (Signed)
After discussing screening questions, patient admits to suicidal thoughts "for a few months now".  When asked about a plan, patient states "I do not want to disclose that information".  SI orders placed.

## 2017-11-01 ENCOUNTER — Encounter (HOSPITAL_COMMUNITY): Payer: Self-pay | Admitting: Registered Nurse

## 2017-11-01 ENCOUNTER — Emergency Department (HOSPITAL_COMMUNITY)
Admission: EM | Admit: 2017-11-01 | Discharge: 2017-11-04 | Disposition: A | Discharge status: Home / Self Care | Payer: Medicaid Other | Attending: Emergency Medicine | Admitting: Emergency Medicine

## 2017-11-01 DIAGNOSIS — F411 Generalized anxiety disorder: Secondary | ICD-10-CM

## 2017-11-01 DIAGNOSIS — F419 Anxiety disorder, unspecified: Secondary | ICD-10-CM

## 2017-11-01 MED ORDER — PANTOPRAZOLE SODIUM 40 MG PO TBEC
40.0000 mg | DELAYED_RELEASE_TABLET | Freq: Two times a day (BID) | ORAL | Status: DC
  Administered 2017-11-01 – 2017-11-04 (×7): 40 mg via ORAL
  Filled 2017-11-01 (×7): qty 1

## 2017-11-01 MED ORDER — AMLODIPINE BESYLATE 5 MG PO TABS
5.0000 mg | ORAL_TABLET | Freq: Every day | ORAL | Status: DC
  Administered 2017-11-01 – 2017-11-04 (×4): 5 mg via ORAL
  Filled 2017-11-01 (×4): qty 1

## 2017-11-01 MED ORDER — ATORVASTATIN CALCIUM 10 MG PO TABS
20.0000 mg | ORAL_TABLET | Freq: Every day | ORAL | Status: DC
  Administered 2017-11-01 – 2017-11-04 (×4): 20 mg via ORAL
  Filled 2017-11-01 (×4): qty 2

## 2017-11-01 MED ORDER — PROPRANOLOL HCL 40 MG PO TABS
20.0000 mg | ORAL_TABLET | Freq: Two times a day (BID) | ORAL | Status: DC | PRN
  Administered 2017-11-04: 20 mg via ORAL
  Filled 2017-11-01: qty 1

## 2017-11-01 MED ORDER — CLONAZEPAM 0.5 MG PO TABS
1.0000 mg | ORAL_TABLET | Freq: Every day | ORAL | Status: DC
  Administered 2017-11-01 – 2017-11-03 (×3): 1 mg via ORAL
  Filled 2017-11-01 (×3): qty 2

## 2017-11-01 MED ORDER — HYDROXYZINE HCL 50 MG PO TABS
50.0000 mg | ORAL_TABLET | Freq: Three times a day (TID) | ORAL | Status: DC | PRN
Start: 2017-11-01 — End: 2017-11-04
  Administered 2017-11-04: 50 mg via ORAL
  Filled 2017-11-01: qty 1

## 2017-11-01 MED ORDER — QUETIAPINE FUMARATE 50 MG PO TABS
100.0000 mg | ORAL_TABLET | Freq: Every day | ORAL | Status: DC
  Administered 2017-11-01 – 2017-11-03 (×3): 100 mg via ORAL
  Filled 2017-11-01 (×3): qty 2

## 2017-11-01 MED ORDER — MONTELUKAST SODIUM 10 MG PO TABS
10.0000 mg | ORAL_TABLET | Freq: Every day | ORAL | Status: DC
  Administered 2017-11-01 – 2017-11-03 (×3): 10 mg via ORAL
  Filled 2017-11-01 (×3): qty 1

## 2017-11-01 MED ORDER — LISINOPRIL 20 MG PO TABS
20.0000 mg | ORAL_TABLET | Freq: Every day | ORAL | Status: DC
  Administered 2017-11-01 – 2017-11-04 (×4): 20 mg via ORAL
  Filled 2017-11-01 (×4): qty 1

## 2017-11-01 MED ORDER — LORATADINE 10 MG PO TABS
10.0000 mg | ORAL_TABLET | Freq: Every day | ORAL | Status: DC
  Administered 2017-11-01 – 2017-11-04 (×4): 10 mg via ORAL
  Filled 2017-11-01 (×4): qty 1

## 2017-11-01 MED ORDER — FLUOXETINE HCL 20 MG PO CAPS
40.0000 mg | ORAL_CAPSULE | Freq: Every day | ORAL | Status: DC
  Administered 2017-11-01 – 2017-11-04 (×4): 40 mg via ORAL
  Filled 2017-11-01 (×4): qty 2

## 2017-11-01 MED ORDER — GABAPENTIN 400 MG PO CAPS
800.0000 mg | ORAL_CAPSULE | Freq: Two times a day (BID) | ORAL | Status: DC
  Administered 2017-11-01 – 2017-11-04 (×7): 800 mg via ORAL
  Filled 2017-11-01 (×7): qty 2

## 2017-11-01 MED ORDER — ONDANSETRON 4 MG PO TBDP
4.0000 mg | ORAL_TABLET | Freq: Three times a day (TID) | ORAL | Status: DC | PRN
  Filled 2017-11-01: qty 1
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
## 2017-11-01 NOTE — ED Provider Notes (Signed)
[facility] EMERGENCY DEPARTMENT Provider Note   CSN: [id number] Arrival date & time: 10/31/17  1905    History   Chief Complaint Chief Complaint  Patient presents with  . Suicidal  . Anxiety    HPI Zachary Russo is a 56 y.o. male.  56 year old male with a history of hypertension, anxiety, Asperger's, and homelessness presents to the emergency department with complaints of management of his anxiety.  He states that his Klonopin was recently decreased from 1 mg daily to 0.5 mg daily.  He has been compliant with his other psychiatric medications, but does not feel they are helping him.  He expresses frustration with policy at [facility] where they are unable to prescribe him benzodiazepines.  He denies any history of benzodiazepine dependence.  He states that he is having suicidal ideations as well with plans to overdose on propranolol.  No homicidal ideations, alcohol use, drug use.      Past Medical History:  Diagnosis Date  . Anxiety   . Asperger syndrome   . Depression   . H/O suicide attempt    overdose "many years ago"  . Homelessness   . Hypertension   . Prostate disorder   . Restless leg syndrome     Patient Active Problem List   Diagnosis Date Noted  . GERD without esophagitis 08/28/2017  . Dysphagia 05/31/2017  . Abnormal finding on imaging 05/31/2017  . Hematemesis without nausea 05/31/2017  . Hiccups 05/31/2017  . Chest pain 05/23/2017  . Nausea and vomiting 05/23/2017  . Hyperlipidemia 02/21/2017  . Insomnia 02/20/2017  . Asperger syndrome 02/06/2017  . Benign prostatic hyperplasia 02/06/2017  . 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 Major depressive disorder, recurrent episode (HCC) 07/07/2015  . Anxiety and depression 01/27/2015  . Adjustment disorder with depressed mood 01/18/2015  . Acute encephalopathy 12/18/2013  . Delirium 12/18/2013  . Essential hypertension 12/18/2013  .  Abnormal brain CT 12/18/2013    History reviewed. No pertinent surgical history.     Home Medications    Prior to Admission medications   Medication Sig Start Date End Date Taking? Authorizing Provider  amLODipine (NORVASC) 5 MG tablet Take 5 mg by mouth daily.    [provider]  atorvastatin (LIPITOR) 20 MG tablet Take 1 tablet (20 mg total) by mouth daily. 08/28/17   Jaclyn Shaggy, MD  cetirizine (ZYRTEC) 10 MG tablet Take 10 mg by mouth daily as needed for allergies.    [provider]  clonazePAM (KLONOPIN) 0.5 MG tablet Take 1 tablet (0.5 mg total) by mouth at bedtime. Patient taking differently: Take 1 mg by mouth at bedtime.  01/24/17   Ward, Layla Maw, DO  FLUoxetine (PROZAC) 40 MG capsule Take 1 capsule (40 mg total) by mouth daily. 02/20/17   Jaclyn Shaggy, MD  gabapentin (NEURONTIN) 400 MG capsule TAKE 2 CAPSULES BY MOUTH TWICE DAILY AT  8  AM  AND  10 PM 07/20/17   Jaclyn Shaggy, MD  hydrOXYzine (ATARAX/VISTARIL) 50 MG tablet Take 1 tablet (50 mg total) by mouth every 8 (eight) hours as needed for anxiety. 02/20/17   Jaclyn Shaggy, MD  lisinopril (PRINIVIL,ZESTRIL) 20 MG tablet Take 1 tablet (20 mg total) by mouth daily. 08/28/17   Jaclyn Shaggy, MD  montelukast (SINGULAIR) 10 MG tablet Take 10 mg by mouth at bedtime.    [provider]  ondansetron (ZOFRAN-ODT) 4 MG disintegrating tablet DISSOLVE 1 TABLET IN MOUTH EVERY 8 HOURS AS  NEEDED FOR NAUSEA OR VOMITING 05/22/17   Jaclyn ShaggyAmao, Enobong, MD  pantoprazole (PROTONIX) 40 MG tablet Take 1 tablet (40 mg total) by mouth 2 (two) times daily before a meal. 05/31/17 05/31/18  Zehr, Shanda BumpsJessica D, PA-C  propranolol (INDERAL) 20 MG tablet TAKE 1 TABLET BY MOUTH TWICE DAILY AS NEEDED FOR ANXIETY 07/20/17   Jaclyn ShaggyAmao, Enobong, MD  QUEtiapine (SEROQUEL) 100 MG tablet Take 1 tablet (100 mg total) by mouth at bedtime. Patient taking differently: Take 200 mg by mouth at bedtime.  02/20/17   Jaclyn ShaggyAmao, Enobong, MD  tamsulosin (FLOMAX) 0.4 MG CAPS  capsule TAKE 2 CAPSULES BY MOUTH  DAILY 07/20/17   Jaclyn ShaggyAmao, Enobong, MD  terbinafine (LAMISIL AT) 1 % cream Apply 1 application topically 2 (two) times daily. 02/06/17   Jaclyn ShaggyAmao, Enobong, MD    Family History Family History  Adopted: Yes  Family history unknown: Yes    Social History Social History   Tobacco Use  . Smoking status: Former Smoker    Years: 2.00  . Smokeless tobacco: Never Used  Substance Use Topics  . Alcohol use: No  . Drug use: Yes    Types: Marijuana    Comment: last time- "maybe" one month ago     Allergies   Amoxicillin; Cephalexin; Lemon oil; Tape; and Codeine   Review of Systems Review of Systems Ten systems reviewed and are negative for acute change, except as noted in the HPI.    Physical Exam Updated Vital Signs BP (!) 151/95 (BP Location: Right Arm)   Pulse 78   Temp 98 F (36.7 C) (Oral)   Resp 16   Ht 5\' 8"  (1.727 m)   Wt 86.2 kg (190 lb)   SpO2 99%   BMI 28.89 kg/m   Physical Exam  Constitutional: He is oriented to person, place, and time. He appears well-developed and well-nourished. No distress.  Nontoxic appearing and in no acute distress  HENT:  Head: Normocephalic and atraumatic.  Eyes: Conjunctivae and EOM are normal. No scleral icterus.  Neck: Normal range of motion.  Cardiovascular: Normal rate, regular rhythm and intact distal pulses.  Pulmonary/Chest: Effort normal. No respiratory distress.  Musculoskeletal: Normal range of motion.  Neurological: He is alert and oriented to person, place, and time. He exhibits normal muscle tone. Coordination normal.  GCS 15.  Speech clear.  Moving all extremities.  Skin: Skin is warm and dry. No rash noted. He is not diaphoretic. No erythema. No pallor.  Psychiatric: He has a normal mood and affect. His behavior is normal. He expresses suicidal ideation.  Calm and cooperative.  He does report active suicidal ideations with plan to overdose on propranolol.  Nursing note and vitals  reviewed.    ED Treatments / Results  Labs (all labs ordered are listed, but only abnormal results are displayed) Labs Reviewed  COMPREHENSIVE METABOLIC PANEL - Abnormal; Notable for the following components:      Result Value   Glucose, Bld 215 (*)    Creatinine, Ser 1.30 (*)    Calcium 8.5 (*)    Total Protein 6.1 (*)    All other components within normal limits  ACETAMINOPHEN LEVEL - Abnormal; Notable for the following components:   Acetaminophen (Tylenol), Serum <10 (*)    All other components within normal limits  CBC - Abnormal; Notable for the following components:   Hemoglobin 12.3 (*)    HCT 38.5 (*)    RDW 16.4 (*)    All other components within normal limits  RAPID URINE DRUG SCREEN, HOSP PERFORMED - Abnormal; Notable for the following components:   Tetrahydrocannabinol POSITIVE (*)    All other components within normal limits  ETHANOL  SALICYLATE LEVEL    EKG  EKG Interpretation None       Radiology No results found.  Procedures Procedures (including critical care time)  Medications Ordered in ED Medications  ondansetron (ZOFRAN-ODT) disintegrating tablet 4 mg (not administered)  amLODipine (NORVASC) tablet 5 mg (not administered)  atorvastatin (LIPITOR) tablet 20 mg (not administered)  loratadine (CLARITIN) tablet 10 mg (not administered)  clonazePAM (KLONOPIN) tablet 1 mg (not administered)  FLUoxetine (PROZAC) capsule 40 mg (not administered)  montelukast (SINGULAIR) tablet 10 mg (not administered)  lisinopril (PRINIVIL,ZESTRIL) tablet 20 mg (not administered)  hydrOXYzine (ATARAX/VISTARIL) tablet 50 mg (not administered)  gabapentin (NEURONTIN) capsule 800 mg (not administered)  pantoprazole (PROTONIX) EC tablet 40 mg (not administered)  propranolol (INDERAL) tablet 20 mg (not administered)  QUEtiapine (SEROQUEL) tablet 100 mg (not administered)     Initial Impression / Assessment and Plan / ED Course  I have reviewed the triage vital  signs and the nursing notes.  Pertinent labs & imaging results that were available during my care of the patient were reviewed by me and considered in my medical decision making (see chart for details).     56 year old male presents to the emergency department for worsening anxiety.  He states that his symptoms are not well controlled on his current psychiatric regimen.  He is also reporting suicidal ideation with plan to overdose on propranolol.  Patient VOLUNTARY at this time. He is medically cleared and pending psychiatric evaluation.  Disposition to be determined by oncoming ED provider.   Final Clinical Impressions(s) / ED Diagnoses   Final diagnoses:  Generalized anxiety disorder    ED Discharge Orders    None       Antony MaduraHumes, Chassidy Layson, PA-C 11/01/17 0630    Glynn Octaveancour, Stephen, MD 11/01/17 [phone]

## 2017-11-01 NOTE — ED Notes (Addendum)
Pt states he had appointment with his physicatris but missed appointment and wants his medication to be managed and fixed so he wont have theses thoughts that been happening for months now.

## 2017-11-01 NOTE — ED Notes (Signed)
TTS in progress at this time.  

## 2017-11-01 NOTE — ED Notes (Signed)
Patient allowed to use phone to make one phone call

## 2017-11-01 NOTE — ED Notes (Signed)
Belongings inventoried and valuables taken to security. Pt was wand

## 2017-11-01 NOTE — Progress Notes (Signed)
The patient was recommended for inpatient treatment.   Patient meets criteria for inpatient treatment. CSW faxed referrals to the following inpatient facilities for review:  [facility], [facility], [facility], [address] [facility], [address], [address] [facility], [facility], [facility], [facility], [facility].    TTS will continue to seek bed placement.   Baldo DaubJolan Osceola Holian MSW, LCSWA CSW Disposition [phone]

## 2017-11-01 NOTE — Consult Note (Signed)
  Zachary Russo, 56 y.o., male [facility] complaints of worsening anxiety, depression, and suicidal ideation.  Patient seen via telepsych by this provider on 11/01/17.  Chart reviewed and consulted with Dr. Lucianne MussKumar.  On evaluation Zachary Russo reports that his depression and anxiety has been worsening and that his medications are not working.  States that he went to [facility] last Friday requesting medication changes.  "[facility] visit [facility] they didn't consider making any medication changes when I was there.  Last Friday they wanted to make changes but I resisted because I had taken the medication before and it didn't work.  I have taken multiple medication in the past that didn't work.  I was taking anywhere from 1 mg to 2 mg of Klonopin that worked now [employer] taking 0.5mg .  I'm taking Seroquel, Gabapentin, and Prozac and none of it is working."  Patient continues to endorse suicidal ideation with a plan; but would not tell what his plan was.  "Yes I have a plan and I'm not going to tell you; because if I decided I want to do it; I want it to work."  Patient states that he also has a history of suicide attempt via overdose with his medications.  Patient denies homicidal ideation, psychosis, and paranoia.    Patient continues to endorse suicidal ideation with plan and unable to contract for safety.    Recommendation:  Inpatient psychiatric treatment   Shaaron Golliday B. Walter Grima, NP

## 2017-11-01 NOTE — ED Provider Notes (Signed)
Sleeping comfortably.  Stable.   Doug SouJacubowitz, Ryah Cribb, MD 11/01/17 0930

## 2017-11-02 NOTE — ED Notes (Signed)
Pt ambulated to bathroom with steady gait. 

## 2017-11-02 NOTE — BHH Counselor (Signed)
Reassessment- Pt reports SI with a plan to overdose. Pt denies HI and AVH.   Zachary Russo, [facility] Triage Specialist
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
## 2017-11-02 NOTE — ED Notes (Signed)
Pt had TTS done stating before she came on that he did not think a couple of extra hours would make him less SI.Marland Kitchen..Marland Kitchen

## 2017-11-03 NOTE — ED Notes (Signed)
Breakfast tray ordered 

## 2017-11-03 NOTE — ED Notes (Addendum)
Regular Diet has been ordered for Dinner, Patient was given apple juice and Cookies.

## 2017-11-03 NOTE — BHH Counselor (Signed)
Re-assessment:   Patient report over the past few days he has been sleeping well. Patient report he feels better, however, he still feels suicidal with a plan to overdose. When asked what are your triggers for your suicidal thoughts patient stated, "The thought of the social safety net been eliminated which included by disability and Medicaid." Patient reports he also feels hopefulness towards his career path. He states their is [place] open to [place] in [place]. Patient report he scared of his ability to get threw the winter due to the heat in his building will be expensive. Stating he would have not quality of life. Patient report his life is surrounding by music. 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  Denies HI and AVH.    Patient report OPT in the past, however, it did not work. Patient report he takes medication for depression, does not feel the medication is completely working for him. Patient [facility].

## 2017-11-03 NOTE — ED Notes (Signed)
Regular Diet ordered for Lunch. 

## 2017-11-03 NOTE — ED Notes (Signed)
No sitter available at this time, per staffing Minerva AreolaEric

## 2017-11-03 NOTE — [facility] Counselor (Signed)
Per Assunta FoundShuvon Rankin, NP, patient continues to meet inpatient criteria.

## 2017-11-03 NOTE — ED Notes (Signed)
Pt provided with wafers and apple juice.

## 2017-11-04 NOTE — ED Notes (Signed)
Pt returned to lobby stating he did not have his valuables - RN located valuables paperwork - retrieved valuables from security - and returned to pt. Pt signed verifying all items present.

## 2017-11-04 NOTE — Discharge Instructions (Signed)
It was our pleasure to provide your ER care today - we hope that you feel better.  Continue your medication.  For stress/anxiety relief, also try exercise, meditation, yoga, and other relaxation techniques.    Follow up closely with your therapist and doctor.   See resource guide provided to help you access additional services.  Also discuss with your doctor and your staff at [facility] whether having a community ACT contact would be a good resource for you.   For mental health issues and/or crisis, go directly to [facility].  Also follow up closely with your primary care doctor in the next 1-2 weeks.   Return to ER if worse, new symptoms, fevers, trouble breathing, other emergency.   Options for mental health treatment in the area: [facility] [address], [place], [place] [address] [phone] (Closes at 12pm on Fridays)  [facility] [address], [place], [place] [address] [phone]  [facility] [address], [place], [place] [address] [phone]  [facility] [address], [place], [place] [address] [phone]

## 2017-11-04 NOTE — ED Notes (Signed)
Pt was anxious, requesting the sitter take his heart rate, which was higher than normal.  RN offered pt some medication to assist him in relaxing which he agreed to.  Will continue to monitor.

## 2017-11-04 NOTE — Progress Notes (Signed)
Pt reported being [facility]ased with [facility] and requested alternate options. CSW provided resources for area providers (psychiatry and therapy) in [place].

## 2017-11-04 NOTE — [facility] Counselor (Signed)
Reassessment note TTS: Pt states that he is still depressed and nothing has changed. He is still having suicidal thoughts and cant contract for safety if discharged. He states that he only has 2 weeks of medications left and he would be right back if he were to leave. Inpatient continues to be recommended.   [address]ristin Jada Kuhnert SpringportLPC, LCAS

## 2017-11-04 NOTE — ED Notes (Addendum)
[facility]. SW, [facility] aware. After pt dressed himself, he began throwing items around in room - Sitter asked pt not to do so. Pt stated "Congratulations. I'm never coming back here again". Pt stated he did not want to return to [facility] d/t seeing new providers at each visit and they change his meds around too much - especially reducing and attempting to d/c his Klonopin. States he will run out of his meds at the end of month. Pt given referrals and resources to other facilities - states he will not be able to make an appt until after the holidays, he thinks. Recommended for pt to follow up [facility] in the meanwhile since he feels he may run out of meds at the end of the month - Pt voiced understanding.

## 2017-11-04 NOTE — ED Provider Notes (Signed)
Patient is awake and alert.  Ate breakfast. States slept fine.  Pt reports feeling improved this AM.   Patient exhibits normal mood and affect. Pleasant/conversant.  He does not voice any thoughts of suicide, or harm to self or others.   Patient indicates has access to outpatient services via [facility] and pcp f/u via [facility].  Pt also indicates has adequate meds to last through the month.    Patient currently appears stable for d/c.  Return precautions provided.     Cathren LaineSteinl, [name], MD 11/04/17 [phone]

## 2017-11-27 ENCOUNTER — Ambulatory Visit: Payer: Self-pay | Admitting: Family Medicine

## 2017-12-04 ENCOUNTER — Encounter: Payer: Self-pay | Admitting: Family Medicine

## 2017-12-04 ENCOUNTER — Ambulatory Visit: Payer: Medicaid Other | Attending: Family Medicine | Admitting: Family Medicine

## 2017-12-04 VITALS — BP 111/79 | HR 103 | Temp 97.4°F | Ht 68.5 in | Wt 191.8 lb

## 2017-12-04 DIAGNOSIS — I10 Essential (primary) hypertension: Secondary | ICD-10-CM | POA: Insufficient documentation

## 2017-12-04 DIAGNOSIS — R739 Hyperglycemia, unspecified: Secondary | ICD-10-CM | POA: Diagnosis not present

## 2017-12-04 DIAGNOSIS — E7800 Pure hypercholesterolemia, unspecified: Secondary | ICD-10-CM | POA: Insufficient documentation

## 2017-12-04 DIAGNOSIS — G2581 Restless legs syndrome: Secondary | ICD-10-CM | POA: Diagnosis not present

## 2017-12-04 DIAGNOSIS — Z885 Allergy status to narcotic agent status: Secondary | ICD-10-CM | POA: Insufficient documentation

## 2017-12-04 DIAGNOSIS — F419 Anxiety disorder, unspecified: Secondary | ICD-10-CM

## 2017-12-04 DIAGNOSIS — Z880 Allergy status to penicillin: Secondary | ICD-10-CM | POA: Insufficient documentation

## 2017-12-04 DIAGNOSIS — N400 Enlarged prostate without lower urinary tract symptoms: Secondary | ICD-10-CM | POA: Insufficient documentation

## 2017-12-04 DIAGNOSIS — F845 Asperger's syndrome: Secondary | ICD-10-CM

## 2017-12-04 DIAGNOSIS — Z79899 Other long term (current) drug therapy: Secondary | ICD-10-CM | POA: Insufficient documentation

## 2017-12-04 DIAGNOSIS — F329 Major depressive disorder, single episode, unspecified: Secondary | ICD-10-CM | POA: Insufficient documentation

## 2017-12-04 DIAGNOSIS — Z915 Personal history of self-harm: Secondary | ICD-10-CM | POA: Diagnosis not present

## 2017-12-04 DIAGNOSIS — Z881 Allergy status to other antibiotic agents status: Secondary | ICD-10-CM | POA: Diagnosis not present

## 2017-12-04 MED ORDER — HYDROXYZINE HCL 50 MG PO TABS: 50.0000 mg | ORAL_TABLET | Freq: Three times a day (TID) | ORAL | 0 refills | Status: AC | PRN

## 2017-12-04 MED ORDER — TAMSULOSIN HCL 0.4 MG PO CAPS: 0.8000 mg | ORAL_CAPSULE | Freq: Every day | ORAL | 3 refills | Status: AC

## 2017-12-04 MED ORDER — ATORVASTATIN CALCIUM 20 MG PO TABS: 20.0000 mg | ORAL_TABLET | Freq: Every day | ORAL | 3 refills | Status: AC

## 2017-12-04 MED ORDER — PANTOPRAZOLE SODIUM 40 MG PO TBEC: 40.0000 mg | DELAYED_RELEASE_TABLET | Freq: Every day | ORAL | 3 refills | Status: AC

## 2017-12-04 MED ORDER — LISINOPRIL 20 MG PO TABS: 20.0000 mg | ORAL_TABLET | Freq: Every day | ORAL | 3 refills | Status: AC

## 2017-12-04 MED ORDER — PROPRANOLOL HCL 20 MG PO TABS: 20.0000 mg | ORAL_TABLET | Freq: Two times a day (BID) | ORAL | 3 refills | Status: AC | PRN

## 2017-12-04 MED ORDER — GABAPENTIN 400 MG PO CAPS: ORAL_CAPSULE | ORAL | 3 refills | Status: AC

## 2017-12-04 NOTE — Patient Instructions (Signed)
 Dyslipidemia Dyslipidemia is an imbalance of waxy, fat-like substances (lipids) in the blood. The body needs lipids in small amounts. Dyslipidemia often involves a high level of cholesterol or triglycerides, which are types of lipids. Common forms of dyslipidemia include:  High levels of bad cholesterol (LDL cholesterol). LDL is the type of cholesterol that causes fatty deposits (plaques) to build up in the blood vessels that carry blood away from your heart (arteries).  Low levels of good cholesterol (HDL cholesterol). HDL cholesterol is the type of cholesterol that protects against heart disease. High levels of HDL remove the LDL buildup from arteries.  High levels of triglycerides. Triglycerides are a fatty substance in the blood that is linked to a buildup of plaques in the arteries.  You can develop dyslipidemia because of the genes you are born with (primary dyslipidemia) or changes that occur during your life (secondary dyslipidemia), or as a side effect of certain medical treatments. What are the causes? Primary dyslipidemia is caused by changes (mutations) in genes that are passed down through families (inherited). These mutations cause several types of dyslipidemia. Mutations can result in disorders that make the body produce too much LDL cholesterol or triglycerides, or not enough HDL cholesterol. These disorders may lead to heart disease, arterial disease, or stroke at an early age. Causes of secondary dyslipidemia include certain lifestyle choices and diseases that lead to dyslipidemia, such as:  Eating a diet that is high in animal fat.  Not getting enough activity or exercise (having a sedentary lifestyle).  Having diabetes, kidney disease, liver disease, or thyroid disease.  Drinking large amounts of alcohol.  Using certain types of drugs.  What increases the risk? You may be at greater risk for dyslipidemia if you are an older man or if you are a woman who has gone  through menopause. Other risk factors include:  Having a family history of dyslipidemia.  Taking certain medicines, including birth control pills, steroids, some diuretics, beta-blockers, and some medicines forHIV.  Smoking cigarettes.  Eating a high-fat diet.  Drinking large amounts of alcohol.  Having certain medical conditions such as diabetes, polycystic ovary syndrome (PCOS), pregnancy, kidney disease, liver disease, or hypothyroidism.  Not exercising regularly.  Being overweight or obese with too much belly fat.  What are the signs or symptoms? Dyslipidemia does not usually cause any symptoms. Very high lipid levels can cause fatty bumps under the skin (xanthomas) or a white or gray ring around the black center (pupil) of the eye. Very high triglyceride levels can cause inflammation of the pancreas (pancreatitis). How is this diagnosed? Your health care provider may diagnose dyslipidemia based on a routine blood test (fasting blood test). Because most people do not have symptoms of the condition, this blood testing (lipid profile) is done on adults age 20 and older and is repeated every 5 years. This test checks:  Total cholesterol. This is a measure of the total amount of cholesterol in your blood, including LDL cholesterol, HDL cholesterol, and triglycerides. A healthy number is below 200.  LDL cholesterol. The target number for LDL cholesterol is different for each person, depending on individual risk factors. For most people, a number below 100 is healthy. Ask your health care provider what your LDL cholesterol number should be.  HDL cholesterol. An HDL level of 60 or higher is best because it helps to protect against heart disease. A number below 40 for men or below 50 for women increases the risk for heart disease.  Triglycerides.   A healthy triglyceride number is below 150.  If your lipid profile is abnormal, your health care provider may do other blood tests to get more  information about your condition. How is this treated? Treatment depends on the type of dyslipidemia that you have and your other risk factors for heart disease and stroke. Your health care provider will have a target range for your lipid levels based on this information. For many people, treatment starts with lifestyle changes, such as diet and exercise. Your health care provider may recommend that you:  Get regular exercise.  Make changes to your diet.  Quit smoking if you smoke.  If diet changes and exercise do not help you reach your goals, your health care provider may also prescribe medicine to lower lipids. The most commonly prescribed type of medicine lowers your LDL cholesterol (statin drug). If you have a high triglyceride level, your provider may prescribe another type of drug (fibrate) or an omega-3 fish oil supplement, or both. Follow these instructions at home:  Take over-the-counter and prescription medicines only as told by your health care provider. This includes supplements.  Get regular exercise. Start an aerobic exercise and strength training program as told by your health care provider. Ask your health care provider what activities are safe for you. Your health care provider may recommend: ? 30 minutes of aerobic activity 4-6 days a week. Brisk walking is an example of aerobic activity. ? Strength training 2 days a week.  Eat a healthy diet as told by your health care provider. This can help you reach and maintain a healthy weight, lower your LDL cholesterol, and raise your HDL cholesterol. It may help to work with a diet and nutrition specialist (dietitian) to make a plan that is right for you. Your dietitian or health care provider may recommend: ? Limiting your calories, if you are overweight. ? Eating more fruits, vegetables, whole grains, fish, and lean meats. ? Limiting saturated fat, trans fat, and cholesterol.  Follow instructions from your health care provider  or dietitian about eating or drinking restrictions.  Limit alcohol intake to no more than one drink per day for nonpregnant women and two drinks per day for men. One drink equals 12 oz of beer, 5 oz of wine, or 1 oz of hard liquor.  Do not use any products that contain nicotine or tobacco, such as cigarettes and e-cigarettes. If you need help quitting, ask your health care provider.  Keep all follow-up visits as told by your health care provider. This is important. Contact a health care provider if:  You are having trouble sticking to your exercise or diet plan.  You are struggling to quit smoking or control your use of alcohol. Summary  Dyslipidemia is an imbalance of waxy, fat-like substances (lipids) in the blood. The body needs lipids in small amounts. Dyslipidemia often involves a high level of cholesterol or triglycerides, which are types of lipids.  Treatment depends on the type of dyslipidemia that you have and your other risk factors for heart disease and stroke.  For many people, treatment starts with lifestyle changes, such as diet and exercise. Your health care provider may also prescribe medicine to lower lipids. This information is not intended to replace advice given to you by your health care provider. Make sure you discuss any questions you have with your health care provider. Document Released: 12/10/2013 Document Revised: 08/01/2016 Document Reviewed: 08/01/2016 Elsevier Interactive Patient Education  2018 Elsevier Inc.  

## 2017-12-04 NOTE — Progress Notes (Signed)
Subjective:  Patient ID: Zachary Russo, male    DOB: [birth year]  Age: 56 y.o. MRN: [id number]  CC: Hypertension and Medication Refill   HPI Zachary Russo is a 56 year old male with a history of Aspergers syndrome, hypertension, hypercholesterolemia anxiety, benign prostatic hyperplasia who presents today For a follow-up visit.  He has been compliant with his antihypertensives and denies any adverse effect. Also takes atorvastatin for hypercholesterolemia and denies myalgias. Lower urinary tract symptoms are controlled on Flomax.  His anxiety is managed by psychiatry and he is in the process of transferring to a new psychiatrist-[facility] due to dissatisfaction with his care at his previous practice. He has been taking Seroquel, Prozac, propranolol and recently ran out of Klonopin.  He has no additional concerns today.  Past Medical History:  Diagnosis Date  . Anxiety   . Asperger syndrome   . Depression   . H/O suicide attempt    overdose "many years ago"  . Homelessness   . Hypertension   . Prostate disorder   . Restless leg syndrome     History reviewed. No pertinent surgical history.  Allergies  Allergen Reactions  . Amoxicillin Other (See Comments)    Ineffective (patient states his is resistant)  . Cephalexin Nausea And Vomiting  . Lemon Oil Itching  . Tape Other (See Comments)    Please use "paper" tape only.  Irritation, redness  . Codeine Itching     Outpatient Medications Prior to Visit  Medication Sig Dispense Refill  . acetaminophen (TYLENOL) 325 MG tablet Take 650 mg every 6 (six) hours as needed by mouth for mild pain.    . clonazePAM (KLONOPIN) 0.5 MG tablet Take 1 tablet (0.5 mg total) by mouth at bedtime. 10 tablet 0  . FLUoxetine (PROZAC) 40 MG capsule Take 1 capsule (40 mg total) by mouth daily. 30 capsule 3  . ondansetron (ZOFRAN-ODT) 4 MG disintegrating tablet DISSOLVE 1 TABLET IN MOUTH EVERY 8 HOURS AS NEEDED FOR NAUSEA OR  VOMITING 30 tablet 1  . QUEtiapine (SEROQUEL) 100 MG tablet Take 1 tablet (100 mg total) by mouth at bedtime. 30 tablet 3  . gabapentin (NEURONTIN) 400 MG capsule TAKE 2 CAPSULES BY MOUTH TWICE DAILY AT  8  AM  AND  10 PM 120 capsule 3  . hydrOXYzine (ATARAX/VISTARIL) 50 MG tablet Take 1 tablet (50 mg total) by mouth every 8 (eight) hours as needed for anxiety. 90 tablet 3  . lisinopril (PRINIVIL,ZESTRIL) 20 MG tablet Take 1 tablet (20 mg total) by mouth daily. 30 tablet 5  . pantoprazole (PROTONIX) 40 MG tablet Take 1 tablet (40 mg total) by mouth 2 (two) times daily before a meal. 60 tablet 11  . propranolol (INDERAL) 20 MG tablet TAKE 1 TABLET BY MOUTH TWICE DAILY AS NEEDED FOR ANXIETY (Patient taking differently: TAKE 20MG  BY MOUTH ONCE DAILY) 60 tablet 3  . tamsulosin (FLOMAX) 0.4 MG CAPS capsule TAKE 2 CAPSULES BY MOUTH  DAILY (Patient taking differently: TAKE 0.8MG  BY MOUTH ONCE DAILY) 60 capsule 3  . terbinafine (LAMISIL AT) 1 % cream Apply 1 application topically 2 (two) times daily. (Patient taking differently: Apply 1 application daily as needed topically. ) 30 g 1  . atorvastatin (LIPITOR) 20 MG tablet Take 1 tablet (20 mg total) by mouth daily. (Patient not taking: Reported on 12/04/2017) 30 tablet 5   No facility-administered medications prior to visit.     ROS Review of Systems  Constitutional: Negative for activity change and appetite  change.  HENT: Negative for sinus pressure and sore throat.   Eyes: Negative for visual disturbance.  Respiratory: Negative for cough, chest tightness and shortness of breath.   Cardiovascular: Negative for chest pain and leg swelling.  Gastrointestinal: Negative for abdominal distention, abdominal pain, constipation and diarrhea.  Endocrine: Negative.   Genitourinary: Negative for dysuria.  Musculoskeletal: Negative for joint swelling and myalgias.  Skin: Negative for rash.  Allergic/Immunologic: Negative.   Neurological: Negative for  weakness, light-headedness and numbness.  Psychiatric/Behavioral: Negative for dysphoric mood and suicidal ideas.    Objective:  BP 111/79   Pulse (!) 103   Temp (!) 97.4 F (36.3 C) (Oral)   Ht 5' 8.5" (1.74 m)   Wt 191 lb 12.8 oz (87 kg)   SpO2 97%   BMI 28.74 kg/m   BP/Weight 12/04/2017 11/04/2017 11/01/2017  Systolic BP 111 129 -  Diastolic BP 79 107 -  Wt. (Lbs) 191.8 - -  BMI 28.74 - 28.89  Some encounter information is confidential and restricted. Go to Review Flowsheets activity to see all data.      Physical Exam  Constitutional: He is oriented to person, place, and time. He appears well-developed and well-nourished.  Cardiovascular: Normal heart sounds and intact distal pulses. Tachycardia present.  No murmur heard. Pulmonary/Chest: Effort normal and breath sounds normal. He has no wheezes. He has no rales. He exhibits no tenderness.  Abdominal: Soft. Bowel sounds are normal. He exhibits no distension and no mass. There is no tenderness.  Musculoskeletal: Normal range of motion.  Neurological: He is alert and oriented to person, place, and time.  Skin: Skin is warm and dry.  Psychiatric: He has a normal mood and affect.    CMP Latest Ref Rng & Units 10/31/2017 08/09/2017 05/23/2017  Glucose 65 - 99 mg/dL 409(W215(H) 119(J139(H) 478(G132(H)  BUN 6 - 20 mg/dL 12 14 15   Creatinine 0.61 - 1.24 mg/dL 9.56(O1.30(H) 1.30(Q1.32(H) 6.570.93  Sodium 135 - 145 mmol/L 138 139 140  Potassium 3.5 - 5.1 mmol/L 4.4 4.5 3.7  Chloride 101 - 111 mmol/L 108 102 106  CO2 22 - 32 mmol/L 24 27 25   Calcium 8.9 - 10.3 mg/dL 8.4(O8.5(L) 9.7 9.6(E8.5(L)  Total Protein 6.5 - 8.1 g/dL 6.1(L) - -  Total Bilirubin 0.3 - 1.2 mg/dL 0.5 - -  Alkaline Phos 38 - 126 U/L 86 - -  AST 15 - 41 U/L 27 - -  ALT 17 - 63 U/L 41 - -    Lipid Panel     Component Value Date/Time   CHOL 194 05/23/2017 0427   TRIG 91 05/23/2017 0427   HDL 39 (L) 05/23/2017 0427   CHOLHDL 5.0 05/23/2017 0427   VLDL 18 05/23/2017 0427   LDLCALC 137 (H)  05/23/2017 0427    The 10-year ASCVD risk score Denman George(Goff DC Jr., et al., 2013) is: 5.6%   Values used to calculate the score:     Age: [age] years     Sex: Male     Is Non-Hispanic African American: No     Diabetic: No     Tobacco smoker: No     Systolic Blood Pressure: 111 mmHg     Is BP treated: Yes     HDL Cholesterol: 39 mg/dL     Total Cholesterol: 194 mg/dL  Assessment & Plan:   1. Pure hypercholesterolemia Stable Low-cholesterol - atorvastatin (LIPITOR) 20 MG tablet; Take 1 tablet (20 mg total) by mouth daily.  Dispense: 30 tablet; Refill: 3  2. Anxiety disorder, unspecified type Currently in between psychiatrists Taking Seroquel, Prozac and Klonopin Will prescribe hydroxyzine as he has run out of Klonopin Was also prescribed gabapentin previously which I have refilled but he has no neuropathy or pain; he would need to discuss this with his psychiatrist regarding long-term use. - hydrOXYzine (ATARAX/VISTARIL) 50 MG tablet; Take 1 tablet (50 mg total) by mouth every 8 (eight) hours as needed for anxiety.  Dispense: 90 tablet; Refill: 0  3. Essential hypertension Controlled We will check potassium level due to hyperkalemia associated with lisinopril - lisinopril (PRINIVIL,ZESTRIL) 20 MG tablet; Take 1 tablet (20 mg total) by mouth daily.  Dispense: 30 tablet; Refill: 3 - propranolol (INDERAL) 20 MG tablet; Take 1 tablet (20 mg total) by mouth 2 (two) times daily as needed. for anxiety  Dispense: 60 tablet; Refill: 3  4. Benign prostatic hyperplasia without lower urinary tract symptoms Controlled - tamsulosin (FLOMAX) 0.4 MG CAPS capsule; Take 2 capsules (0.8 mg total) by mouth daily.  Dispense: 60 capsule; Refill: 3  5. Hyperglycemia - Hemoglobin A1c  6. Asperger syndrome As per psychiatrist   Meds ordered this encounter  Medications  . atorvastatin (LIPITOR) 20 MG tablet    Sig: Take 1 tablet (20 mg total) by mouth daily.    Dispense:  30 tablet    Refill:  3  .  gabapentin (NEURONTIN) 400 MG capsule    Sig: TAKE 2 CAPSULES BY MOUTH TWICE DAILY    Dispense:  120 capsule    Refill:  3    Please consider 90 day supplies to promote better adherence  . hydrOXYzine (ATARAX/VISTARIL) 50 MG tablet    Sig: Take 1 tablet (50 mg total) by mouth every 8 (eight) hours as needed for anxiety.    Dispense:  90 tablet    Refill:  0  . lisinopril (PRINIVIL,ZESTRIL) 20 MG tablet    Sig: Take 1 tablet (20 mg total) by mouth daily.    Dispense:  30 tablet    Refill:  3  . pantoprazole (PROTONIX) 40 MG tablet    Sig: Take 1 tablet (40 mg total) by mouth daily.    Dispense:  30 tablet    Refill:  3  . propranolol (INDERAL) 20 MG tablet    Sig: Take 1 tablet (20 mg total) by mouth 2 (two) times daily as needed. for anxiety    Dispense:  60 tablet    Refill:  3    Please consider 90 day supplies to promote better adherence  . tamsulosin (FLOMAX) 0.4 MG CAPS capsule    Sig: Take 2 capsules (0.8 mg total) by mouth daily.    Dispense:  60 capsule    Refill:  3    Please consider 90 day supplies to promote better adherence    Follow-up: Return in about 3 months (around 03/04/2018) for follow up of chronic medical conditions.   Jaclyn ShaggyEnobong Amao MD

## 2017-12-05 ENCOUNTER — Other Ambulatory Visit: Payer: Self-pay | Admitting: Family Medicine

## 2017-12-05 DIAGNOSIS — R7303 Prediabetes: Secondary | ICD-10-CM | POA: Insufficient documentation

## 2017-12-05 LAB — HEMOGLOBIN A1C
ESTIMATED AVERAGE GLUCOSE: 137 mg/dL
HEMOGLOBIN A1C: 6.4 % — AB (ref 4.8–5.6)

## 2017-12-05 MED ORDER — METFORMIN HCL 500 MG PO TABS: 500.0000 mg | ORAL_TABLET | Freq: Two times a day (BID) | ORAL | 3 refills | Status: AC

## 2017-12-07 ENCOUNTER — Telehealth: Payer: Self-pay

## 2017-12-07 NOTE — Telephone Encounter (Signed)
Pt was called and informed of lab results. 

## 2018-01-19 DEATH — deceased
